# Patient Record
Sex: Female | Born: 1950 | Race: White | Hispanic: No | Marital: Married | Smoking: Never smoker
Health system: Southern US, Community
[De-identification: ages and names within clinical notes are randomized; demographics above are authoritative.]

## PROBLEM LIST (undated history)

## (undated) DIAGNOSIS — G2581 Restless legs syndrome: Secondary | ICD-10-CM

## (undated) DIAGNOSIS — M1990 Unspecified osteoarthritis, unspecified site: Secondary | ICD-10-CM

## (undated) DIAGNOSIS — G4733 Obstructive sleep apnea (adult) (pediatric): Secondary | ICD-10-CM

## (undated) DIAGNOSIS — R251 Tremor, unspecified: Secondary | ICD-10-CM

## (undated) DIAGNOSIS — F329 Major depressive disorder, single episode, unspecified: Secondary | ICD-10-CM

## (undated) DIAGNOSIS — E039 Hypothyroidism, unspecified: Secondary | ICD-10-CM

## (undated) DIAGNOSIS — I10 Essential (primary) hypertension: Secondary | ICD-10-CM

## (undated) DIAGNOSIS — K219 Gastro-esophageal reflux disease without esophagitis: Secondary | ICD-10-CM

## (undated) DIAGNOSIS — M791 Myalgia, unspecified site: Secondary | ICD-10-CM

## (undated) DIAGNOSIS — D696 Thrombocytopenia, unspecified: Secondary | ICD-10-CM

## (undated) DIAGNOSIS — F32A Depression, unspecified: Secondary | ICD-10-CM

## (undated) DIAGNOSIS — F419 Anxiety disorder, unspecified: Secondary | ICD-10-CM

## (undated) DIAGNOSIS — R7303 Prediabetes: Secondary | ICD-10-CM

## (undated) DIAGNOSIS — N8110 Cystocele, unspecified: Secondary | ICD-10-CM

## (undated) HISTORY — PX: TONSILLECTOMY: SUR1361

## (undated) HISTORY — DX: Anxiety disorder, unspecified: F41.9

## (undated) HISTORY — DX: Major depressive disorder, single episode, unspecified: F32.9

## (undated) HISTORY — PX: ANKLE FRACTURE SURGERY: SHX122

## (undated) HISTORY — DX: Depression, unspecified: F32.A

## (undated) HISTORY — PX: KNEE CARTILAGE SURGERY: SHX688

## (undated) HISTORY — DX: Essential (primary) hypertension: I10

## (undated) HISTORY — PX: FRACTURE SURGERY: SHX138

## (undated) HISTORY — DX: Unspecified osteoarthritis, unspecified site: M19.90

---

## 1998-09-30 ENCOUNTER — Other Ambulatory Visit: Admission: RE | Admit: 1998-09-30 | Discharge: 1998-09-30 | Payer: Self-pay | Admitting: Obstetrics and Gynecology

## 1998-11-22 ENCOUNTER — Ambulatory Visit (HOSPITAL_COMMUNITY): Admission: RE | Admit: 1998-11-22 | Discharge: 1998-11-22 | Payer: Self-pay | Admitting: Gastroenterology

## 1999-10-22 ENCOUNTER — Other Ambulatory Visit: Admission: RE | Admit: 1999-10-22 | Discharge: 1999-10-22 | Payer: Self-pay | Admitting: Obstetrics and Gynecology

## 1999-12-19 ENCOUNTER — Other Ambulatory Visit: Admission: RE | Admit: 1999-12-19 | Discharge: 1999-12-19 | Payer: Self-pay | Admitting: Obstetrics and Gynecology

## 1999-12-19 ENCOUNTER — Encounter (INDEPENDENT_AMBULATORY_CARE_PROVIDER_SITE_OTHER): Payer: Self-pay

## 2001-08-05 ENCOUNTER — Other Ambulatory Visit: Admission: RE | Admit: 2001-08-05 | Discharge: 2001-08-05 | Payer: Self-pay | Admitting: Obstetrics and Gynecology

## 2001-09-22 ENCOUNTER — Ambulatory Visit (HOSPITAL_COMMUNITY): Admission: RE | Admit: 2001-09-22 | Discharge: 2001-09-22 | Payer: Self-pay | Admitting: Obstetrics and Gynecology

## 2001-09-22 ENCOUNTER — Encounter (INDEPENDENT_AMBULATORY_CARE_PROVIDER_SITE_OTHER): Payer: Self-pay

## 2002-08-18 ENCOUNTER — Ambulatory Visit (HOSPITAL_COMMUNITY): Admission: RE | Admit: 2002-08-18 | Discharge: 2002-08-18 | Payer: Self-pay | Admitting: Obstetrics and Gynecology

## 2002-08-18 ENCOUNTER — Encounter (INDEPENDENT_AMBULATORY_CARE_PROVIDER_SITE_OTHER): Payer: Self-pay

## 2003-08-14 ENCOUNTER — Other Ambulatory Visit: Admission: RE | Admit: 2003-08-14 | Discharge: 2003-08-14 | Payer: Self-pay | Admitting: Obstetrics and Gynecology

## 2004-09-29 ENCOUNTER — Ambulatory Visit (HOSPITAL_COMMUNITY): Admission: RE | Admit: 2004-09-29 | Discharge: 2004-09-29 | Payer: Self-pay | Admitting: Chiropractic Medicine

## 2004-10-07 ENCOUNTER — Other Ambulatory Visit: Admission: RE | Admit: 2004-10-07 | Discharge: 2004-10-07 | Payer: Self-pay | Admitting: Obstetrics and Gynecology

## 2005-05-18 ENCOUNTER — Ambulatory Visit (HOSPITAL_COMMUNITY): Admission: RE | Admit: 2005-05-18 | Discharge: 2005-05-18 | Payer: Self-pay | Admitting: Gastroenterology

## 2006-03-31 ENCOUNTER — Other Ambulatory Visit: Admission: RE | Admit: 2006-03-31 | Discharge: 2006-03-31 | Payer: Self-pay | Admitting: Obstetrics and Gynecology

## 2008-12-19 ENCOUNTER — Ambulatory Visit (HOSPITAL_COMMUNITY): Admission: RE | Admit: 2008-12-19 | Discharge: 2008-12-19 | Payer: Self-pay | Admitting: Family Medicine

## 2009-09-19 ENCOUNTER — Encounter: Admission: RE | Admit: 2009-09-19 | Discharge: 2009-09-19 | Payer: Self-pay | Admitting: Emergency Medicine

## 2009-12-14 HISTORY — PX: FRACTURE SURGERY: SHX138

## 2011-01-19 ENCOUNTER — Ambulatory Visit (HOSPITAL_COMMUNITY): Payer: BC Managed Care – PPO

## 2011-01-19 ENCOUNTER — Ambulatory Visit (HOSPITAL_COMMUNITY)
Admission: RE | Admit: 2011-01-19 | Discharge: 2011-01-19 | Disposition: A | Discharge status: Home / Self Care | Payer: BC Managed Care – PPO | Source: Ambulatory Visit | Attending: Orthopedic Surgery | Admitting: Orthopedic Surgery

## 2011-01-19 DIAGNOSIS — Y9355 Activity, bike riding: Secondary | ICD-10-CM | POA: Insufficient documentation

## 2011-01-19 DIAGNOSIS — S82402A Unspecified fracture of shaft of left fibula, initial encounter for closed fracture: Secondary | ICD-10-CM

## 2011-01-19 DIAGNOSIS — S82899A Other fracture of unspecified lower leg, initial encounter for closed fracture: Secondary | ICD-10-CM | POA: Insufficient documentation

## 2011-01-19 LAB — CBC
HCT: 42.6 % (ref 36.0–46.0)
Hemoglobin: 14.5 g/dL (ref 12.0–15.0)
MCH: 30.9 pg (ref 26.0–34.0)
MCHC: 34 g/dL (ref 30.0–36.0)
MCV: 90.8 fL (ref 78.0–100.0)
Platelets: 171 10*3/uL (ref 150–400)
RBC: 4.69 MIL/uL (ref 3.87–5.11)
RDW: 12.8 % (ref 11.5–15.5)
WBC: 7.3 10*3/uL (ref 4.0–10.5)

## 2011-01-19 LAB — COMPREHENSIVE METABOLIC PANEL
ALT: 16 U/L (ref 0–35)
AST: 26 U/L (ref 0–37)
Albumin: 4.1 g/dL (ref 3.5–5.2)
Alkaline Phosphatase: 74 U/L (ref 39–117)
BUN: 16 mg/dL (ref 6–23)
CO2: 26 mEq/L (ref 19–32)
Calcium: 9.6 mg/dL (ref 8.4–10.5)
Chloride: 105 mEq/L (ref 96–112)
Creatinine, Ser: 0.81 mg/dL (ref 0.4–1.2)
GFR calc Af Amer: >60 mL/min (ref >60)
GFR calc non Af Amer: >60 mL/min (ref >60)
Glucose, Bld: 99 mg/dL (ref 70–99)
Potassium: 4 mEq/L (ref 3.5–5.1)
Sodium: 142 mEq/L (ref 135–145)
Total Bilirubin: 1 mg/dL (ref 0.3–1.2)
Total Protein: 6.6 g/dL (ref 6.0–8.3)

## 2011-01-19 LAB — PROTIME-INR
INR: 0.99 (ref 0.00–1.49)
Prothrombin Time: 13.3 seconds (ref 11.6–15.2)

## 2011-01-19 LAB — SURGICAL PCR SCREEN
MRSA, PCR: NEGATIVE
Staphylococcus aureus: POSITIVE — AB

## 2011-01-19 LAB — APTT: aPTT: 28 seconds (ref 24–37)

## 2011-01-19 IMAGING — RF DG ANKLE 2V *L*
1 series · 2 of 2 positions shown · non-contrast
Comparison: None.

CLINICAL DATA: Intraoperative films taken during operative
reduction and fixation of a distal left fibular fracture.

LEFT ANKLE - 2 VIEW

[Series 1: run · 2 of 2 slices shown]
[im 1/2]
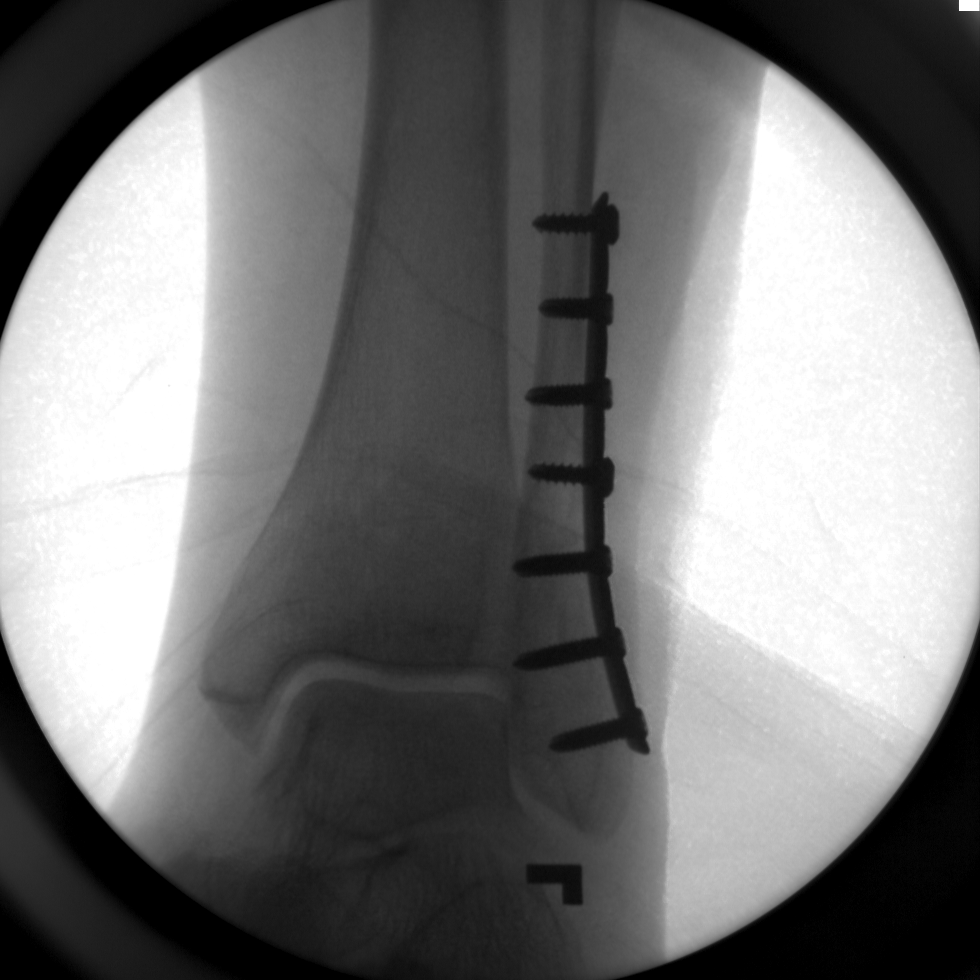
[im 2/2]
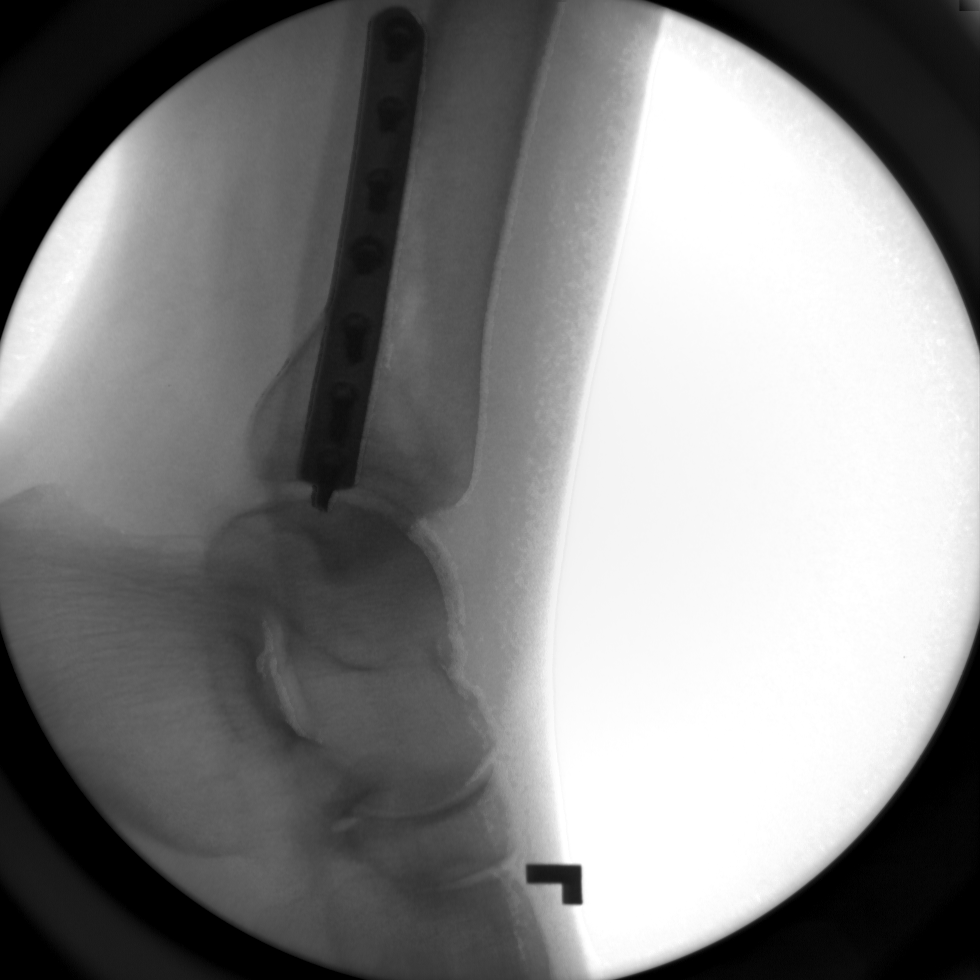

[2 of 2 positions shown; findings below may reference images not displayed]

FINDINGS: Compression plate and screws have been placed reducing
and transfixing a distal left fibular fracture.  The fracture
appears anatomically aligned and positioned.  The plate and screws
appear in satisfactory position.
IMPRESSION: Satisfactory appearance following open reduction and internal
fixation of distal left fibular fracture.
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
## 2011-05-01 NOTE — Op Note (Signed)
Vanessa [name], TOPPINS            ACCOUNT NO.:  [id number]   MEDICAL RECORD NO.:  [id number]          PATIENT TYPE:  AMB   LOCATION:  ENDO                         FACILITY:  [facility]   PHYSICIAN:  Bernette Redbird, M.D.   DATE OF BIRTH:  June 12, 1951   DATE OF PROCEDURE:  05/18/2005  DATE OF DISCHARGE:                                 OPERATIVE REPORT   PROCEDURE:  Colonoscopy.   INDICATION:  A 60 year old female for colon cancer screening in view of a  family history of colon cancer in her mother, who was diagnosed at age 91.  The patient had a negative colonoscopy by me about seven years ago.   FINDINGS:  Normal exam to the terminal ileum.   PROCEDURE:  The nature, purpose, risks of the procedure were familiar to the  patient from prior examinations, and she provided written consent.  Sedation  was fentanyl 75 mcg and Versed 7 mg IV without arrhythmias or desaturation.  The Olympus adjustable-tension pediatric video colonoscope was advanced to  the terminal ileum with just a little bit of external abdominal compression.  The TI had normal appearance, and pullback was then performed.  The quality  of prep was very good, and it is felt that all areas were well-seen.   This was a normal examination.  No polyps, cancer, colitis, vascular  malformations or diverticulosis were noted, and retroflexion the rectum and  reinspection of the rectum were unremarkable.  No biopsies were obtained.  The patient tolerated the procedure well, and there no apparent  complications.   IMPRESSION:  Normal screening colonoscopy in a patient with a family history  of colon cancer.   PLAN:  Follow up colonoscopy in 5 years.      RB/MEDQ  D:  05/18/2005  T:  05/18/2005  Job:  [id number]   cc:   Holley Bouche, M.D.  [address]  [place], [place] [address]  Fax: [phone]   Juluis Mire, M.D.  [address] [place]  [place] [address]  Fax: [phone]

## 2011-05-01 NOTE — H&P (Signed)
NAME:  Vanessa [name], Vanessa [name]                      ACCOUNT NO.:  [id number]   MEDICAL RECORD NO.:  [id number]                   PATIENT TYPE:  AMB   LOCATION:  [facility]                                  FACILITY:  [facility]   PHYSICIAN:  Juluis Mire, M.D.                DATE OF BIRTH:  July 08, 1951   DATE OF ADMISSION:  08/18/2002  DATE OF DISCHARGE:                                HISTORY & PHYSICAL   REASON FOR ADMISSION:  Patient is a G2, P1, AB1.  Patient prevents for  hysteroscopy for removal of endometrial polyp.   In relation to present admission, patient in August 2002 had been on  Activelle for hormone replacement therapy.  She experienced some abnormal  bleeding and subsequently underwent saline-infused ultrasound with finding  of a large endometrial polyp.  She subsequently underwent hysteroscopy in  October which revealed a large endometrial polyp that was resected.  Pathology was benign.  She had been off hormone replacement therapy since  that time but had experienced recurrent vaginal bleeding.  Repeat saline-  infused ultrasound done in June 2003 did reveal recurrent endometrial polyp.  Because of this, she is back for hysteroscopic resection.   ALLERGIES:  No known drug allergies.   MEDICATIONS:  None.   PAST MEDICAL HISTORY:  Usual childhood diseases without any significant  sequelae.   PAST SURGICAL HISTORY:  1. Tonsillectomy.  2. Previous noted hysteroscopy.   OBSTETRICAL HISTORY:  1. Normal spontaneous vaginal delivery.  2. One miscarriage.   FAMILY HISTORY:  Noncontributory.   SOCIAL HISTORY:  Noncontributory.   REVIEW OF SYSTEMS:  Noncontributory.   PHYSICAL EXAMINATION:  GENERAL:  Patient is afebrile.  Normal vital signs.  HEENT:  Normocephalic.  Pupils equal, round and reactive to light and  accomodation.  EOMs intact.  Sclerae and conjunctivae clear.  Oropharynx  clear.  Neck without thyromegaly.  BREAST:  No discrete masses.  LUNGS:  Clear.  CARDIOVASCULAR:  Regular rate and rhythm without murmur or gallops.  ABDOMEN:  Benign.  No masses, organomegaly or tenderness.  PELVIC:  Normal external genitalia.  Vaginal mucosa clear.  Cervix  unremarkable.  Uterus normal in size, shape and contour.  Adnexa without  masses or tenderness.  RECTOVAGINAL:  Clear.  EXTREMITIES:  Trace edema.  NEUROLOGICAL:  Grossly within normal limits.   IMPRESSION:  1. Postmenopausal bleeding with recurrent endometrial polyp.   PLAN:  1. The patient will undergo hysteroscopic resection of the endometrial     polyp.  The risks of surgery have been discussed including the risk of     anesthesia, risk of infection, risk of vascular injury that could lead to     hemorrhage requiring transfusion or possible hysterectomy.  Risks of     uterine perforation that could lead to injury to adjacent organs     recurring laparoscopy or possible exploratory laparotomy, risk of deep     vein thrombosis and  pulmonary embolus.  The patient expressed     understanding of implications and risks.                                               Juluis Mire, M.D.    JSM/MEDQ  D:  08/18/2002  T:  08/18/2002  Job:  [id number]

## 2011-05-01 NOTE — Op Note (Signed)
[facility]  Patient:    Vanessa [name], Vanessa [name] Visit Number: [id number] MRN: [id number]          Service Type: DSU Location: [facility] Attending Physician:  Frederich Balding Dictated by:   Juluis Mire, M.D. Proc. Date: 09/22/01 Admit Date:  09/22/2001                             Operative Report  PREOPERATIVE DIAGNOSIS:       Postmenopausal bleeding.  POSTOPERATIVE DIAGNOSIS:      Postmenopausal bleeding with the finding of a                               fairly large endometrial polyp. OPERATION:                    Cervical dilation, hysteroscopy with removal of                               endometrial polyp, and multiple endometrial                               biopsies, and endometrial curettings.  SURGEON:                      Juluis Mire, M.D.  ANESTHESIA:                   General.  ESTIMATED BLOOD LOSS:         Minimal.  PACKS AND DRAINS:             None.  INTRAOPERATIVE BLOOD REPLACEMENT:                  None.  COMPLICATIONS:                None.  INDICATIONS:                  Are dictated in the History and Physical.  DESCRIPTION OF PROCEDURE:     The patient was taken to the OR and placed in the supine position.  After a satisfactory level of general anesthesia was obtained, the patient was placed in the dorsolithotomy position using the [employer] stirrups.  The perineum and vagina were prepped out with Betadine and draped in the sterile field.  The speculum was placed in the vaginal vault. The cervix was grasped with a single tooth tenaculum.  The uterus sounded to 8 cm.  The cervix was serially dilated to a size 35 Pratt dilator.  The hysteroscope was introduced.  The intrauterine cavity was distended using sorbitol.  A large endometrial polyp was noted and removed using the resectoscope.  It was sent for pathologic review.  Endometrial lining was somewhat hyperemic, but otherwise unremarkable.  These were the  anterior, posterior, and both lateral walls.  No active bleeding or perforation was noted.  Endometrial curettings were also obtained and sent for pathologic review.  At the end of the procedure, there was no evidence of perforation or active bleeding.  The single tooth tenaculum and speculum then removed.  The patient was taken out of the dorsolithotomy position once alert, and transferred to the recovery room in good condition.  Sponge, instrument, and needle count  was reported as correct by the circulating nurse. Dictated by:   Juluis Mire, M.D. Attending Physician:  Frederich Balding DD:  09/22/01 TD:  09/22/01 Job: [id number]

## 2011-05-01 NOTE — Op Note (Signed)
   NAME:  Vanessa [name], Vanessa [name]                      ACCOUNT NO.:  [id number]   MEDICAL RECORD NO.:  [id number]                   PATIENT TYPE:  AMB   LOCATION:  [facility]C                                  FACILITY:  [facility]   PHYSICIAN:  Juluis Mire, M.D.                DATE OF BIRTH:  1951-03-07   DATE OF PROCEDURE:  08/18/2002  DATE OF DISCHARGE:                                 OPERATIVE REPORT   PREOPERATIVE DIAGNOSES:  Postmenopausal bleeding with endometrial polyp.   POSTOPERATIVE DIAGNOSES:  Postmenopausal bleeding with endometrial polyp.   PROCEDURE:  Paracervical block, cervical dilation, hysteroscopy with  multiple endometrial biopsies, endometrial curettings.   SURGEON:  Juluis Mire, M.D.   ANESTHESIA:  Sedation with paracervical block.   ESTIMATED BLOOD LOSS:  Nil.   PACKS AND DRAINS:  None.   INTRAOPERATIVE BLOOD PLACED:  None.   COMPLICATIONS:  None.   INDICATIONS:  Dictated in history and physical.   PROCEDURE AS FOLLOWS:  The patient was taken to the OR, placed in supine  position.  After sedation was placed in the dorsal lithotomy position using  the [employer] stirrups.  The patient was then draped out for hysteroscopy.  Speculum was placed in the vaginal vault.  Cervix, and vagina cleansed with  Betadine.  Paracervical block using 1% Xylocaine was instituted.  Cervix was  secured with a single tooth tenaculum.  Uterus sounded approximately 8 cm.  Cervix serially dilated to a size 35 Pratt dilator.  Operative hysteroscope  was introduced.  Intrauterine cavity was visualized after distention with  sorbitol.  Did not really see a polyp at this point.  It may have been  disrupted by the cervical dilation.  There was some shaggy endometrium.  We  obtained multiple endometrial biopsies in the anterior and posterior lateral  walls.  These were all sent for pathologic review.  No active bleeding was  noted.  Endometrial curettings were obtained, also sent for pathology.  No  active bleeding or perforation was noted.  The hysteroscope and single tooth  tenaculum were then removed.  The patient taken out of the dorsal lithotomy  position.  Once alert transferred to recovery room in good condition.  Sponge, instrument, needle count reported as correct by circulating nurse.                                               Juluis Mire, M.D.    JSM/MEDQ  D:  08/18/2002  T:  08/18/2002  Job:  [id number]

## 2011-05-01 NOTE — H&P (Signed)
[facility]  Patient:    Vanessa [name], Vanessa [name] Visit Number: [id number] MRN: [id number]          Service Type: Attending:  Juluis Mire, M.D. Dictated by:   Juluis Mire, M.D. Adm. Date:  09/22/01                           History and Physical  HISTORY OF PRESENT ILLNESS:   The patient is a 60 year old, gravida 2, para 1, abortus 1, married, white female who presents for hysteroscopy with D&C for evaluation of abnormal uterine bleeding.  In relation to the present admission, the patient has been on Activella for management of menopausal symptomatology.  She has had continued abnormal bleeding in June and July.  She had a previous ultrasound and endometrial sampling done last year with negative findings.  She underwent repeat evaluation, which was basically unremarkable.  In view of continued abnormal bleeding on hormone replacement therapy, the patient presents for hysteroscopic evaluation.  ALLERGIES:                    No known drug allergies.  MEDICATIONS:                  Activella.  PAST MEDICAL HISTORY:         Usual childhood diseases without any significant sequelae.  PAST SURGICAL HISTORY:        Tonsillectomy.  OBSTETRICAL HISTORY:          One spontaneous vaginal delivery.  One miscarriage.  FAMILY HISTORY:               Noncontributory.  SOCIAL HISTORY:               Noncontributory.  REVIEW OF SYSTEMS:            Noncontributory.  PHYSICAL EXAMINATION:         The patient is afebrile with stable vital signs.  HEENT:                        The patient is normocephalic and atraumatic. Pupils equal, round, and reactive to light and accommodation.  Extraocular movements are intact.  The sclerae and conjunctivae are clear.  NECK:                         Without thyromegaly.  BREASTS:                      No discrete masses.  LUNGS:                        Clear.  CARDIOVASCULAR:               Regular rate.  No murmurs or  gallops.  ABDOMEN:                      Exam is benign.  PELVIC:                       Normal external genitalia.  The vaginal mucosa is clear.  Cervix unremarkable.  Uterus normal in size, shape, and contrast. Adnexa free of masses or tenderness.  The rectovaginal exam is clear.  EXTREMITIES:  Trace edema.  NEUROLOGIC:                   Grossly within normal limits.  BASIC IMPRESSION:             Postmenopausal bleeding on continuous hormone replacement therapy.  PLAN:                         The patient will undergo hysteroscopic evaluation with resectoscope to rule out any type of endometrial pathology. The risks of surgery have been discussed, including the risk of hemorrhage which could necessitate transfusion or hysterectomy, the risk of uterine perforation which could lead to injury to adjacent organs requiring laparoscopy and possible exploratory laparotomy, and the risk of deep venous thrombosis and pulmonary embolus.  The patient expressed and understanding of the indications and risks and is accepting of them. Dictated by:   Juluis Mire, M.D. Attending:  Juluis Mire, M.D. DD:  09/22/01 TD:  09/22/01 Job: [id number]

## 2011-06-10 ENCOUNTER — Other Ambulatory Visit: Payer: Self-pay | Admitting: Obstetrics and Gynecology

## 2011-06-10 DIAGNOSIS — R928 Other abnormal and inconclusive findings on diagnostic imaging of breast: Secondary | ICD-10-CM

## 2011-06-18 ENCOUNTER — Ambulatory Visit
Admission: RE | Admit: 2011-06-18 | Discharge: 2011-06-18 | Disposition: A | Discharge status: Home / Self Care | Payer: BC Managed Care – PPO | Source: Ambulatory Visit | Attending: Obstetrics and Gynecology | Admitting: Obstetrics and Gynecology

## 2011-06-18 DIAGNOSIS — R928 Other abnormal and inconclusive findings on diagnostic imaging of breast: Secondary | ICD-10-CM

## 2011-12-25 ENCOUNTER — Ambulatory Visit (INDEPENDENT_AMBULATORY_CARE_PROVIDER_SITE_OTHER): Payer: BC Managed Care – PPO

## 2011-12-25 DIAGNOSIS — F4321 Adjustment disorder with depressed mood: Secondary | ICD-10-CM

## 2011-12-25 DIAGNOSIS — J069 Acute upper respiratory infection, unspecified: Secondary | ICD-10-CM

## 2011-12-25 DIAGNOSIS — J111 Influenza due to unidentified influenza virus with other respiratory manifestations: Secondary | ICD-10-CM

## 2012-02-14 ENCOUNTER — Other Ambulatory Visit: Payer: Self-pay | Admitting: Emergency Medicine

## 2012-03-09 ENCOUNTER — Ambulatory Visit (INDEPENDENT_AMBULATORY_CARE_PROVIDER_SITE_OTHER): Payer: BC Managed Care – PPO | Admitting: Emergency Medicine

## 2012-03-09 VITALS — BP 84/55 | HR 85 | Temp 98.0°F | Resp 20 | Ht 67.0 in | Wt 161.4 lb

## 2012-03-09 DIAGNOSIS — E789 Disorder of lipoprotein metabolism, unspecified: Secondary | ICD-10-CM

## 2012-03-09 DIAGNOSIS — F339 Major depressive disorder, recurrent, unspecified: Secondary | ICD-10-CM

## 2012-03-09 DIAGNOSIS — F329 Major depressive disorder, single episode, unspecified: Secondary | ICD-10-CM

## 2012-03-09 DIAGNOSIS — I10 Essential (primary) hypertension: Secondary | ICD-10-CM

## 2012-03-09 DIAGNOSIS — E785 Hyperlipidemia, unspecified: Secondary | ICD-10-CM

## 2012-03-09 DIAGNOSIS — F32A Depression, unspecified: Secondary | ICD-10-CM | POA: Insufficient documentation

## 2012-03-09 LAB — LIPID PANEL
Cholesterol: 174 mg/dL (ref 0–200)
HDL: 70 mg/dL (ref >39)
LDL Cholesterol: 88 mg/dL (ref 0–99)
Total CHOL/HDL Ratio: 2.5 Ratio
Triglycerides: 79 mg/dL (ref <150)
VLDL: 16 mg/dL (ref 0–40)

## 2012-03-09 LAB — POCT CBC
Granulocyte percent: 51.9 %G (ref 37–80)
HCT, POC: 42.8 % (ref 37.7–47.9)
Hemoglobin: 14 g/dL (ref 12.2–16.2)
Lymph, poc: 1.7 (ref 0.6–3.4)
MCH, POC: 30.2 pg (ref 27–31.2)
MCHC: 32.7 g/dL (ref 31.8–35.4)
MCV: 92.4 fL (ref 80–97)
MID (cbc): 0.4 (ref 0–0.9)
MPV: 11.1 fL (ref 0–99.8)
POC Granulocyte: 2.3 (ref 2–6.9)
POC LYMPH PERCENT: 38.9 %L (ref 10–50)
POC MID %: 9.2 %M (ref 0–12)
Platelet Count, POC: 144 10*3/uL (ref 142–424)
RBC: 4.63 M/uL (ref 4.04–5.48)
RDW, POC: 13 %
WBC: 4.4 10*3/uL — AB (ref 4.6–10.2)

## 2012-03-09 LAB — COMPREHENSIVE METABOLIC PANEL
ALT: 14 U/L (ref 0–35)
AST: 22 U/L (ref 0–37)
Albumin: 4.3 g/dL (ref 3.5–5.2)
Alkaline Phosphatase: 62 U/L (ref 39–117)
BUN: 18 mg/dL (ref 6–23)
CO2: 28 mEq/L (ref 19–32)
Calcium: 9.7 mg/dL (ref 8.4–10.5)
Chloride: 107 mEq/L (ref 96–112)
Creat: 0.77 mg/dL (ref 0.50–1.10)
Glucose, Bld: 87 mg/dL (ref 70–99)
Potassium: 4.3 mEq/L (ref 3.5–5.3)
Sodium: 143 mEq/L (ref 135–145)
Total Bilirubin: 0.4 mg/dL (ref 0.3–1.2)
Total Protein: 6.6 g/dL (ref 6.0–8.3)

## 2012-03-09 LAB — TSH: TSH: 2.913 u[IU]/mL (ref 0.350–4.500)

## 2012-03-09 MED ORDER — ESCITALOPRAM OXALATE 10 MG PO TABS: ORAL_TABLET | ORAL | Status: DC

## 2012-03-09 MED ORDER — ALPRAZOLAM 1 MG PO TABS: 1.0000 mg | ORAL_TABLET | Freq: Every evening | ORAL | Status: DC | PRN

## 2012-03-09 MED ORDER — PRAVASTATIN SODIUM 40 MG PO TABS: 40.0000 mg | ORAL_TABLET | ORAL | Status: DC

## 2012-03-09 MED ORDER — LISINOPRIL 20 MG PO TABS: ORAL_TABLET | ORAL | Status: DC

## 2012-03-09 NOTE — Progress Notes (Signed)
  Subjective:    Patient ID: Vanessa [name], female    DOB: 02/27/1951, 60 y.o.   MRN: [id number]  HPI patient enters for followup of her high blood pressure high cholesterol and depression. She resigned from her position in December. Since that time she is done incredibly better. Her anxiety is much improved. Her depression is much improved. She has felt remarkably better without the stress of her previous employer.    Review of Systems  Constitutional: Negative.   HENT: Negative.   Eyes: Negative.   Respiratory: Negative.   Cardiovascular: Negative.   Gastrointestinal: Negative.   Genitourinary: Negative.   Neurological: Negative.        Objective:   Physical Exam  Constitutional: She appears well-developed.  HENT:  Head: Normocephalic.  Eyes: Pupils are equal, round, and reactive to light.  Neck: No JVD present. No tracheal deviation present. No thyromegaly present.  Cardiovascular: Normal rate, regular rhythm and normal heart sounds.   Pulmonary/Chest: Breath sounds normal. No respiratory distress. She has no wheezes. She has no rales. She exhibits no tenderness.  Abdominal: She exhibits no distension and no mass. There is no tenderness. There is no rebound and no guarding.  Musculoskeletal: She exhibits no edema.  Lymphadenopathy:    She has no cervical adenopathy.  Neurological: No cranial nerve deficit. Coordination normal.          Assessment & Plan:   Patient doing much better since she quit her job. She feels much better she feels less depressed. She feels the Wellbutrin does not help at all. She would like to switch back to an SSRI which have plan to do. Also cut her blood pressure medication in that initial blood pressure was extremely low repeat done was 110/70.

## 2012-04-23 ENCOUNTER — Telehealth: Payer: Self-pay

## 2012-04-23 NOTE — Telephone Encounter (Signed)
Was given a rx for lexapro, but can not afford. Wants to get a rx for generic celexa   [facility] [address]

## 2012-04-24 MED ORDER — CITALOPRAM HYDROBROMIDE 20 MG PO TABS: 20.0000 mg | ORAL_TABLET | Freq: Every day | ORAL | Status: DC

## 2012-04-24 NOTE — Telephone Encounter (Signed)
I have switched rx to celexa 20 mg. Please notify pt.

## 2012-04-25 NOTE — Telephone Encounter (Signed)
patient notified and voiced understanding. 

## 2012-09-19 ENCOUNTER — Telehealth: Payer: Self-pay

## 2012-09-19 NOTE — Telephone Encounter (Signed)
The patient called to request refill of generic xanax.  The patient states that she does not have employment at this time and it would be a financial hardship to come in to be seen at this time.  The patient uses the [facility] on [address].  Please call the patient at [phone].

## 2012-09-20 MED ORDER — ALPRAZOLAM 1 MG PO TABS: 1.0000 mg | ORAL_TABLET | Freq: Every evening | ORAL | Status: DC | PRN

## 2012-09-20 NOTE — Telephone Encounter (Signed)
At TL desk 

## 2012-09-20 NOTE — Telephone Encounter (Signed)
Rx called in and pt notified.

## 2012-10-24 ENCOUNTER — Other Ambulatory Visit: Payer: Self-pay | Admitting: Physician Assistant

## 2012-11-28 ENCOUNTER — Other Ambulatory Visit: Payer: Self-pay | Admitting: Radiology

## 2012-11-28 NOTE — Telephone Encounter (Signed)
Please advise on renewal of Alprazolam 1mg  fax rc'd from [facility], pended Rx

## 2012-11-28 NOTE — Telephone Encounter (Signed)
Have also gotten request for Citalopram pended this as well

## 2012-11-29 ENCOUNTER — Other Ambulatory Visit: Payer: Self-pay | Admitting: Radiology

## 2012-11-29 MED ORDER — ALPRAZOLAM 1 MG PO TABS: 1.0000 mg | ORAL_TABLET | Freq: Every evening | ORAL | Status: DC | PRN

## 2012-11-29 MED ORDER — CITALOPRAM HYDROBROMIDE 20 MG PO TABS: 20.0000 mg | ORAL_TABLET | Freq: Every day | ORAL | Status: DC

## 2012-12-31 ENCOUNTER — Other Ambulatory Visit: Payer: Self-pay | Admitting: Emergency Medicine

## 2012-12-31 NOTE — Telephone Encounter (Signed)
Please call and see what is going on with patient. She has not been on Xanax regular.

## 2013-01-01 NOTE — Telephone Encounter (Signed)
Please call patient and give more information about why she needs the Xanax . She's had difficulty at times with her family and stress she rarely takes the medication. If she just needs this for short term it would be okay to refill medication x1 .

## 2013-01-03 NOTE — Telephone Encounter (Signed)
Called patient and she takes at bedtime, helps her "shut off" She takes it every night, she sometimes can cut this in half. Please advise. She states she can not come in now because she is without insurance, I advised her I will call back and let her know

## 2013-01-03 NOTE — Telephone Encounter (Signed)
She can have one refill on her medication but for further refills she will need to come in and talk with me

## 2013-01-09 NOTE — Telephone Encounter (Signed)
Patient was advised of this 

## 2013-03-08 ENCOUNTER — Other Ambulatory Visit: Payer: Self-pay | Admitting: Emergency Medicine

## 2013-03-08 NOTE — Telephone Encounter (Signed)
Called her. She is doing great, she does not have any health insurance currently, having a hard time finding a job. She is advised she is in need of office visit.

## 2013-03-08 NOTE — Telephone Encounter (Signed)
Please call and see what is going on with Vanessa [name]. I have not seen her for a year and at the last visit she was doing well but that was one year ago. It is certainly okay to refill her Xanax but I would like more information about what is going on in her life and see whether she needs to come in and talk to me.

## 2013-04-05 ENCOUNTER — Ambulatory Visit: Payer: Self-pay | Admitting: Emergency Medicine

## 2013-04-05 VITALS — BP 124/90 | HR 89 | Temp 98.0°F | Resp 16 | Ht 66.0 in | Wt 205.0 lb

## 2013-04-05 DIAGNOSIS — R059 Cough, unspecified: Secondary | ICD-10-CM

## 2013-04-05 DIAGNOSIS — J209 Acute bronchitis, unspecified: Secondary | ICD-10-CM

## 2013-04-05 DIAGNOSIS — R05 Cough: Secondary | ICD-10-CM

## 2013-04-05 DIAGNOSIS — R062 Wheezing: Secondary | ICD-10-CM

## 2013-04-05 DIAGNOSIS — G4700 Insomnia, unspecified: Secondary | ICD-10-CM

## 2013-04-05 MED ORDER — IPRATROPIUM BROMIDE 0.02 % IN SOLN
0.5000 mg | Freq: Once | RESPIRATORY_TRACT | Status: AC
  Administered 2013-04-05: 0.5 mg via RESPIRATORY_TRACT

## 2013-04-05 MED ORDER — ALBUTEROL SULFATE HFA 108 (90 BASE) MCG/ACT IN AERS: 2.0000 | INHALATION_SPRAY | RESPIRATORY_TRACT | Status: DC | PRN

## 2013-04-05 MED ORDER — ALBUTEROL SULFATE (2.5 MG/3ML) 0.083% IN NEBU
2.5000 mg | INHALATION_SOLUTION | Freq: Once | RESPIRATORY_TRACT | Status: AC
  Administered 2013-04-05: 2.5 mg via RESPIRATORY_TRACT

## 2013-04-05 MED ORDER — ALPRAZOLAM 1 MG PO TABS: ORAL_TABLET | ORAL | Status: DC

## 2013-04-05 MED ORDER — AMOXICILLIN 500 MG PO CAPS: ORAL_CAPSULE | ORAL | Status: DC

## 2013-04-05 MED ORDER — BENZONATATE 100 MG PO CAPS: 100.0000 mg | ORAL_CAPSULE | Freq: Three times a day (TID) | ORAL | Status: DC | PRN

## 2013-04-05 NOTE — Patient Instructions (Addendum)

## 2013-04-05 NOTE — Progress Notes (Signed)
  Subjective:    Patient ID: Vanessa [name], female    DOB: January 04, 1951, 62 y.o.   MRN: [id number]  HPI patient states she caught a cold a few weeks ago. 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 She has no history of allergies this time here. She's had significant nasal drainage and recently has developed a cough which has been productive of greenish type phlegm. She has a significant cough. She is a nonsmoker. She is currently not working she has gotten insurance which starts next month. She needs a refill on her Xanax she takes at night.    Review of Systems     Objective:   Physical Exam HEENT exam eyes are normal TMs are clear nose is significantly congested the posterior pharynx is clear. The neck is supple. There is no adenopathy. Chest exam reveals poor air exchange with decreased breath sounds in the bases peak flow was attempted but she had difficulty with this but the register was 210. After breathing treatment with Albuterol, peak flow increased to 300.        Assessment & Plan:  It sounds as though patient developed upper as to infection now followed by sinusitis with some evidence of bronchospasm. Will given nebulizer treatment with albuterol and then reevaluate to see if albuterol inhaler is indicated along with her antibiotics she will need for her discolored phlegm.

## 2013-05-01 ENCOUNTER — Other Ambulatory Visit: Payer: Self-pay | Admitting: Obstetrics and Gynecology

## 2013-05-01 DIAGNOSIS — N63 Unspecified lump in unspecified breast: Secondary | ICD-10-CM

## 2013-05-26 ENCOUNTER — Other Ambulatory Visit: Payer: Self-pay | Admitting: Gastroenterology

## 2013-08-29 ENCOUNTER — Other Ambulatory Visit: Payer: Self-pay | Admitting: Emergency Medicine

## 2013-10-18 ENCOUNTER — Other Ambulatory Visit: Payer: Self-pay | Admitting: Emergency Medicine

## 2013-10-18 NOTE — Telephone Encounter (Signed)
Needs OV, 2nd notice 

## 2013-12-13 ENCOUNTER — Other Ambulatory Visit: Payer: Self-pay | Admitting: Emergency Medicine

## 2013-12-16 ENCOUNTER — Ambulatory Visit (INDEPENDENT_AMBULATORY_CARE_PROVIDER_SITE_OTHER): Payer: BC Managed Care – PPO | Admitting: Emergency Medicine

## 2013-12-16 VITALS — BP 118/86 | HR 88 | Temp 98.2°F | Resp 16 | Ht 66.75 in | Wt 209.0 lb

## 2013-12-16 DIAGNOSIS — F3289 Other specified depressive episodes: Secondary | ICD-10-CM

## 2013-12-16 DIAGNOSIS — J209 Acute bronchitis, unspecified: Secondary | ICD-10-CM

## 2013-12-16 DIAGNOSIS — F32A Depression, unspecified: Secondary | ICD-10-CM

## 2013-12-16 DIAGNOSIS — R062 Wheezing: Secondary | ICD-10-CM

## 2013-12-16 DIAGNOSIS — G4700 Insomnia, unspecified: Secondary | ICD-10-CM

## 2013-12-16 DIAGNOSIS — I10 Essential (primary) hypertension: Secondary | ICD-10-CM

## 2013-12-16 DIAGNOSIS — E785 Hyperlipidemia, unspecified: Secondary | ICD-10-CM

## 2013-12-16 DIAGNOSIS — F329 Major depressive disorder, single episode, unspecified: Secondary | ICD-10-CM

## 2013-12-16 LAB — COMPREHENSIVE METABOLIC PANEL
ALT: 18 U/L (ref 0–35)
AST: 24 U/L (ref 0–37)
Albumin: 4.2 g/dL (ref 3.5–5.2)
Alkaline Phosphatase: 77 U/L (ref 39–117)
BILIRUBIN TOTAL: 0.7 mg/dL (ref 0.3–1.2)
BUN: 12 mg/dL (ref 6–23)
CO2: 27 meq/L (ref 19–32)
CREATININE: 0.73 mg/dL (ref 0.50–1.10)
Calcium: 9.2 mg/dL (ref 8.4–10.5)
Chloride: 106 mEq/L (ref 96–112)
Glucose, Bld: 91 mg/dL (ref 70–99)
Potassium: 3.9 mEq/L (ref 3.5–5.3)
Sodium: 140 mEq/L (ref 135–145)
Total Protein: 7 g/dL (ref 6.0–8.3)

## 2013-12-16 LAB — POCT CBC
GRANULOCYTE PERCENT: 61.5 % (ref 37–80)
HEMATOCRIT: 45.8 % (ref 37.7–47.9)
HEMOGLOBIN: 14.4 g/dL (ref 12.2–16.2)
Lymph, poc: 1.8 (ref 0.6–3.4)
MCH: 30.2 pg (ref 27–31.2)
MCHC: 31.4 g/dL — AB (ref 31.8–35.4)
MCV: 96 fL (ref 80–97)
MID (CBC): 0.4 (ref 0–0.9)
MPV: 9.4 fL (ref 0–99.8)
POC Granulocyte: 3.5 (ref 2–6.9)
POC LYMPH PERCENT: 31.7 %L (ref 10–50)
POC MID %: 6.8 %M (ref 0–12)
Platelet Count, POC: 152 10*3/uL (ref 142–424)
RBC: 4.77 M/uL (ref 4.04–5.48)
RDW, POC: 13.4 %
WBC: 5.7 10*3/uL (ref 4.6–10.2)

## 2013-12-16 LAB — LIPID PANEL
Cholesterol: 199 mg/dL (ref 0–200)
HDL: 55 mg/dL (ref >39)
LDL Cholesterol: 117 mg/dL — ABNORMAL HIGH (ref 0–99)
TRIGLYCERIDES: 136 mg/dL (ref <150)
Total CHOL/HDL Ratio: 3.6 Ratio
VLDL: 27 mg/dL (ref 0–40)

## 2013-12-16 LAB — TSH: TSH: 2.964 u[IU]/mL (ref 0.350–4.500)

## 2013-12-16 MED ORDER — ESCITALOPRAM OXALATE 10 MG PO TABS: ORAL_TABLET | ORAL | Status: DC

## 2013-12-16 MED ORDER — ALPRAZOLAM 1 MG PO TABS: ORAL_TABLET | ORAL | Status: DC

## 2013-12-16 MED ORDER — ALBUTEROL SULFATE (2.5 MG/3ML) 0.083% IN NEBU
2.5000 mg | INHALATION_SOLUTION | Freq: Once | RESPIRATORY_TRACT | Status: AC
  Administered 2013-12-16: 2.5 mg via RESPIRATORY_TRACT

## 2013-12-16 MED ORDER — CITALOPRAM HYDROBROMIDE 20 MG PO TABS: ORAL_TABLET | ORAL | Status: DC

## 2013-12-16 MED ORDER — PRAVASTATIN SODIUM 40 MG PO TABS: 40.0000 mg | ORAL_TABLET | ORAL | Status: DC

## 2013-12-16 MED ORDER — LISINOPRIL 20 MG PO TABS: ORAL_TABLET | ORAL | Status: DC

## 2013-12-16 MED ORDER — ALBUTEROL SULFATE HFA 108 (90 BASE) MCG/ACT IN AERS: 2.0000 | INHALATION_SPRAY | RESPIRATORY_TRACT | Status: DC | PRN

## 2013-12-16 NOTE — Progress Notes (Addendum)
   Subjective:    Patient ID: Vanessa [name], female    DOB: 03/16/1951, 63 y.o.   MRN: [id number] This chart was scribed for Lesle ChrisSteven Zoey Gilkeson, MD by Nicholos Johnsenise Iheanachor, Medical Scribe. This patient's care was started at 9:05 AM.  HPI HPI Comments: Vanessa [name] is a 63 y.o. female who presents to the [facility] complaining of sinus pressure, HA, clear productive cough, and rhinorrhea. Pt reports a low grade fever earlier this week that was resolved with ibuprofen. Been taking albuterol with some relief. Pt has potential sick contacts. Pt states she did not use her inhaler this morning. Pt did not have a flu shot in 2014.  Pt is also here for Lisinopril, Xanex, and Celexa refill. Reports she has been off her cholesterol medication for 6-7 months  Review of Systems  HENT: Positive for sinus pressure.   Respiratory: Positive for cough.   Neurological: Positive for headaches.      Objective:   Physical Exam  Vitals reviewed.  CONSTITUTIONAL: Well developed/well nourished HEAD: Normocephalic/atraumatic EYES: EOMI/PERRL ENMT: Mucous membranes moist NECK: supple no meningeal signs SPINE:entire spine nontender CV: S1/S2 noted, no murmurs/rubs/gallops noted LUNGS: Lungs are clear to auscultation bilaterally, no apparent distress there are occasional wheezes present but good air exchange ABDOMEN: soft, nontender, no rebound or guarding GU:no cva tenderness NEURO: Pt is awake/alert, moves all extremitiesx4 EXTREMITIES: pulses normal, full ROM SKIN: warm, color normal PSYCH: no abnormalities of mood noted Results for orders placed in visit on 12/16/13  POCT CBC      Result Value Range   WBC 5.7  4.6 - 10.2 K/uL   Lymph, poc 1.8  0.6 - 3.4   POC LYMPH PERCENT 31.7  10 - 50 %L   MID (cbc) 0.4  0 - 0.9   POC MID % 6.8  0 - 12 %M   POC Granulocyte 3.5  2 - 6.9   Granulocyte percent 61.5  37 - 80 %G   RBC 4.77  4.04 - 5.48 M/uL   Hemoglobin 14.4  12.2 - 16.2 g/dL   HCT, POC 78.245.8  95.637.7 - 47.9 %   MCV 96.0  80 - 97 fL   MCH, POC 30.2  27 - 31.2 pg   MCHC 31.4 (*) 31.8 - 35.4 g/dL   RDW, POC 21.313.4     Platelet Count, POC 152  142 - 424 K/uL   MPV 9.4  0 - 99.8 fL      Assessment & Plan:   Meds were refilled. She will use her albuterol inhaler.

## 2013-12-16 NOTE — Progress Notes (Signed)
   Subjective:    Patient ID: Vanessa [name], female    DOB: 12/28/1950, 63 y.o.   MRN: [id number]  HPI    Review of Systems     Objective:   Physical Exam        Assessment & Plan:

## 2014-02-14 ENCOUNTER — Telehealth: Payer: Self-pay

## 2014-02-14 NOTE — Telephone Encounter (Signed)
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

## 2014-02-15 NOTE — Telephone Encounter (Signed)
Spoke to patient, she is aware she will need an ov for this. She is aware.

## 2014-05-10 ENCOUNTER — Other Ambulatory Visit: Payer: Self-pay | Admitting: Emergency Medicine

## 2014-05-11 NOTE — Telephone Encounter (Signed)
Faxed

## 2014-06-26 ENCOUNTER — Other Ambulatory Visit: Payer: Self-pay | Admitting: Emergency Medicine

## 2014-06-27 NOTE — Telephone Encounter (Signed)
Phoned in.

## 2014-08-05 ENCOUNTER — Other Ambulatory Visit: Payer: Self-pay | Admitting: Emergency Medicine

## 2014-08-07 ENCOUNTER — Other Ambulatory Visit: Payer: Self-pay | Admitting: Radiology

## 2014-08-07 NOTE — Telephone Encounter (Signed)
I gave her one refill but I would really like her to come in and talk to me and give me an update about how she is feeling.

## 2014-08-07 NOTE — Telephone Encounter (Signed)
Rx is being faxed from [facility]. LMOM for pt that f/up is needed for more.

## 2014-08-11 ENCOUNTER — Other Ambulatory Visit: Payer: Self-pay | Admitting: Emergency Medicine

## 2014-08-15 ENCOUNTER — Telehealth: Payer: Self-pay

## 2014-08-15 NOTE — Telephone Encounter (Signed)
Pt is wanting to talk with someone about refill on her alprazalem and that the rx has not been sent to pharmacist yet

## 2014-08-15 NOTE — Telephone Encounter (Signed)
Spoke with pt. She says her pharm never got the rx. Looks like [facility] was supposed to fax it not sure if they did. Called it into her pharm for her.

## 2014-10-14 ENCOUNTER — Ambulatory Visit (INDEPENDENT_AMBULATORY_CARE_PROVIDER_SITE_OTHER): Payer: BC Managed Care – PPO | Admitting: Emergency Medicine

## 2014-10-14 VITALS — BP 118/80 | HR 75 | Temp 97.6°F | Resp 18 | Ht 66.5 in | Wt 190.0 lb

## 2014-10-14 DIAGNOSIS — M25562 Pain in left knee: Secondary | ICD-10-CM

## 2014-10-14 DIAGNOSIS — E785 Hyperlipidemia, unspecified: Secondary | ICD-10-CM

## 2014-10-14 DIAGNOSIS — R0683 Snoring: Secondary | ICD-10-CM

## 2014-10-14 DIAGNOSIS — G4700 Insomnia, unspecified: Secondary | ICD-10-CM

## 2014-10-14 DIAGNOSIS — M25561 Pain in right knee: Secondary | ICD-10-CM

## 2014-10-14 DIAGNOSIS — I10 Essential (primary) hypertension: Secondary | ICD-10-CM

## 2014-10-14 LAB — COMPLETE METABOLIC PANEL WITH GFR
ALBUMIN: 4.2 g/dL (ref 3.5–5.2)
ALT: 12 U/L (ref 0–35)
AST: 17 U/L (ref 0–37)
Alkaline Phosphatase: 80 U/L (ref 39–117)
BILIRUBIN TOTAL: 0.5 mg/dL (ref 0.2–1.2)
BUN: 16 mg/dL (ref 6–23)
CO2: 26 mEq/L (ref 19–32)
Calcium: 9.4 mg/dL (ref 8.4–10.5)
Chloride: 106 mEq/L (ref 96–112)
Creat: 0.71 mg/dL (ref 0.50–1.10)
GFR, Est African American: >89 mL/min
GLUCOSE: 94 mg/dL (ref 70–99)
POTASSIUM: 4.1 meq/L (ref 3.5–5.3)
SODIUM: 139 meq/L (ref 135–145)
TOTAL PROTEIN: 6.9 g/dL (ref 6.0–8.3)

## 2014-10-14 LAB — LIPID PANEL
Cholesterol: 222 mg/dL — ABNORMAL HIGH (ref 0–200)
HDL: 55 mg/dL (ref >39)
LDL CALC: 147 mg/dL — AB (ref 0–99)
Total CHOL/HDL Ratio: 4 Ratio
Triglycerides: 101 mg/dL (ref <150)
VLDL: 20 mg/dL (ref 0–40)

## 2014-10-14 LAB — TSH: TSH: 2 u[IU]/mL (ref 0.350–4.500)

## 2014-10-14 LAB — POCT CBC
GRANULOCYTE PERCENT: 55.9 % (ref 37–80)
HEMATOCRIT: 45.7 % (ref 37.7–47.9)
Hemoglobin: 14.7 g/dL (ref 12.2–16.2)
Lymph, poc: 1.7 (ref 0.6–3.4)
MCH, POC: 30.3 pg (ref 27–31.2)
MCHC: 32.2 g/dL (ref 31.8–35.4)
MCV: 94.2 fL (ref 80–97)
MID (cbc): 0.5 (ref 0–0.9)
MPV: 8.8 fL (ref 0–99.8)
POC Granulocyte: 2.7 (ref 2–6.9)
POC LYMPH PERCENT: 34.9 %L (ref 10–50)
POC MID %: 9.2 % (ref 0–12)
Platelet Count, POC: 152 10*3/uL (ref 142–424)
RBC: 4.86 M/uL (ref 4.04–5.48)
RDW, POC: 14.4 %
WBC: 4.9 10*3/uL (ref 4.6–10.2)

## 2014-10-14 MED ORDER — TRAMADOL HCL 50 MG PO TABS: 50.0000 mg | ORAL_TABLET | Freq: Four times a day (QID) | ORAL | Status: DC | PRN

## 2014-10-14 MED ORDER — CITALOPRAM HYDROBROMIDE 20 MG PO TABS: ORAL_TABLET | ORAL | Status: DC

## 2014-10-14 MED ORDER — ALPRAZOLAM 1 MG PO TABS: ORAL_TABLET | ORAL | Status: DC

## 2014-10-14 MED ORDER — LISINOPRIL 20 MG PO TABS: ORAL_TABLET | ORAL | Status: DC

## 2014-10-14 MED ORDER — PRAVASTATIN SODIUM 40 MG PO TABS: 40.0000 mg | ORAL_TABLET | ORAL | Status: DC

## 2014-10-14 NOTE — Progress Notes (Addendum)
Subjective:    Patient ID: Vanessa [name], female    DOB: 11/21/1951, 63 y.o.   MRN: [id number]  HPI Chief Complaint  Patient presents with  . Follow-up    labs needed    This chart was scribed for Lesle ChrisSteven Iyanla Eilers, MD by Andrew Auaven Small, ED Scribe. This patient was seen in room 11 and the patient's care was started at 8:24 AM.  HPI Comments:  Vanessa [name] is a 63 y.o. female with PMHx of HTN, hyperlipidemia, and depression who presents to the [facility] here for a follow up. Pt is here to obtain labs and check HTN. Pt is currently taking 20mg  lisinopril, 20 mg celexa, and a half xanax every night before bed. Pt has not taken HTN medication or eaten this morning.  Pt does not use inhaler or use Wellbutrin. Pt reports she has lost weight since last visit. Pt has not been exercise due to a past knee injury. Pt states she UTD on her mammogram and colonoscopy and reports an upcoming visit with GYN for a physical. Pt reports she snores and has done so for a long time. She is considering a sleep study but not sure she can afford it.  Patient Active Problem List   Diagnosis Date Noted  . Hypertension 03/09/2012  . Hyperlipidemia 03/09/2012  . Depression 03/09/2012   Past Medical History  Diagnosis Date  . Depression   . Anxiety   . Hypertension   . Arthritis    Past Surgical History  Procedure Laterality Date  . Fracture surgery     No Known Allergies Prior to Admission medications   Medication Sig Start Date End Date Taking? Authorizing Provider  albuterol (PROVENTIL HFA;VENTOLIN HFA) 108 (90 BASE) MCG/ACT inhaler Inhale 2 puffs into the lungs every 4 (four) hours as needed for wheezing (cough, shortness of breath or wheezing.). 12/16/13  Yes Collene GobbleSteven A Kiana Hollar, MD  ALPRAZolam Prudy Feeler(XANAX) 1 MG tablet TAKE ONE TABLET BY MOUTH ONCE DAILY AT BEDTIME AS NEEDED 08/07/14  Yes Collene GobbleSteven A Laynie Espy, MD  citalopram (CELEXA) 20 MG tablet Take one tablet daily 12/16/13  Yes Collene GobbleSteven A [place] Zaragoza, MD    lisinopril (PRINIVIL,ZESTRIL) 20 MG tablet Take one half tablet a day for blood pressure control 12/16/13  Yes Collene GobbleSteven A Paitlyn Mcclatchey, MD  traMADol (ULTRAM) 50 MG tablet Take 50 mg by mouth every 6 (six) hours as needed.   Yes Historical Provider, MD  buPROPion (WELLBUTRIN XL) 150 MG 24 hr tablet Take 150 mg by mouth daily.    Historical Provider, MD  pravastatin (PRAVACHOL) 40 MG tablet Take 1 tablet (40 mg total) by mouth 1 day or 1 dose. 12/16/13   Collene GobbleSteven A Julieanna Geraci, MD   History   Social History  . Marital Status: Married    Spouse Name: N/A    Number of Children: N/A  . Years of Education: N/A   Occupational History  . Not on file.   Social History Main Topics  . Smoking status: Never Smoker   . Smokeless tobacco: Never Used  . Alcohol Use: No  . Drug Use: No  . Sexual Activity: Yes    Birth Control/ Protection: None   Other Topics Concern  . Not on file   Social History Narrative   Review of Systems  Objective:   Physical Exam  CONSTITUTIONAL: Well developed/well nourished HEAD: Normocephalic/atraumatic EYES: EOMI/PERRL ENMT: Mucous membranes moist NECK: supple no meningeal signs SPINE:entire spine nontender CV: S1/S2 noted, no murmurs/rubs/gallops noted  LUNGS: Lungs are clear to auscultation bilaterally, no apparent distress ABDOMEN: soft, nontender, no rebound or guarding GU:no cva tenderness NEURO: Pt is awake/alert, moves all extremitiesx4 EXTREMITIES: pulses normal, full ROM SKIN: warm, color normal PSYCH: no abnormalities of mood noted  Filed Vitals:   10/14/14 0814  BP: 118/80  Pulse: 75  Temp: 97.6 F (36.4 C)  TempSrc: Oral  Resp: 18  Height: 5' 6.5" (1.689 m)  Weight: 190 lb (86.183 kg)  SpO2: 98%   Results for orders placed or performed in visit on 10/14/14  POCT CBC  Result Value Ref Range   WBC 4.9 4.6 - 10.2 K/uL   Lymph, poc 1.7 0.6 - 3.4   POC LYMPH PERCENT 34.9 10 - 50 %L   MID (cbc) 0.5 0 - 0.9   POC MID % 9.2 0 - 12 %M   POC Granulocyte  2.7 2 - 6.9   Granulocyte percent 55.9 37 - 80 %G   RBC 4.86 4.04 - 5.48 M/uL   Hemoglobin 14.7 12.2 - 16.2 g/dL   HCT, POC 84.145.7 32.437.7 - 47.9 %   MCV 94.2 80 - 97 fL   MCH, POC 30.3 27 - 31.2 pg   MCHC 32.2 31.8 - 35.4 g/dL   RDW, POC 40.114.4 %   Platelet Count, POC 152 142 - 424 K/uL   MPV 8.8 0 - 99.8 fL   Assessment & Plan:  Patient currently off of Wellbutrin. She does have a significant amount of stress at home with her husband not working and recent increase in her health insurance premiums that she will not be able to afford. I do feel she should stay on the Celexa. She currently takes Xanax half to 1 tablet at night. She has been off her pravastatin and is awaiting her cholesterol results. She currently is on lisinopril 20 one a day without side effects. Her blood pressures at different visits have been well controlled. She does have snoring and I do believe a sleep study is indicated.she was given a refill of her Ultram she takes for knee pain.she refused a flu shot  I personally performed the services described in this documentation, which was scribed in my presence. The recorded information has been reviewed and is accurate.

## 2014-10-16 ENCOUNTER — Encounter: Payer: Self-pay | Admitting: Radiology

## 2014-10-16 ENCOUNTER — Other Ambulatory Visit: Payer: Self-pay | Admitting: Emergency Medicine

## 2014-10-16 ENCOUNTER — Telehealth: Payer: Self-pay

## 2014-10-16 DIAGNOSIS — G4710 Hypersomnia, unspecified: Secondary | ICD-10-CM

## 2014-10-16 DIAGNOSIS — R0683 Snoring: Secondary | ICD-10-CM

## 2014-10-16 MED ORDER — ATORVASTATIN CALCIUM 40 MG PO TABS: 40.0000 mg | ORAL_TABLET | Freq: Every day | ORAL | Status: DC

## 2014-10-16 NOTE — Telephone Encounter (Signed)
Pt called. She said she was at the pharm and their were 2 rx's waiting for her; simvastatin and atorvastatin. Advised to p/u Lipitor since this is the one Dr. Cleta Albertsaub sent to the pharm today.

## 2014-12-28 ENCOUNTER — Other Ambulatory Visit: Payer: Self-pay

## 2014-12-28 DIAGNOSIS — I10 Essential (primary) hypertension: Secondary | ICD-10-CM

## 2014-12-28 MED ORDER — LISINOPRIL 20 MG PO TABS: ORAL_TABLET | ORAL | Status: DC

## 2014-12-28 NOTE — Telephone Encounter (Signed)
Pharm sent req for new Rx for lisinopril 20 mg. Pt is advising that she is supposed to be taking 1 whole tablet QD. Dr Cleta Albertsaub, I see in your last OV notes in the HPI sec you write that pt is taking 20 mg of lisinopril QD, but the med list for visit still has 1/2 tablet written. I have pended Rx for 1 tab as pt advises, but wanted to check with you.

## 2015-04-19 ENCOUNTER — Other Ambulatory Visit: Payer: Self-pay | Admitting: Emergency Medicine

## 2015-04-22 NOTE — Telephone Encounter (Signed)
Faxed

## 2015-05-27 ENCOUNTER — Encounter: Payer: Self-pay | Admitting: *Deleted

## 2015-06-10 ENCOUNTER — Other Ambulatory Visit: Payer: Self-pay | Admitting: Emergency Medicine

## 2015-06-11 NOTE — Telephone Encounter (Signed)
Faxed

## 2015-07-23 ENCOUNTER — Other Ambulatory Visit: Payer: Self-pay | Admitting: Emergency Medicine

## 2015-07-25 ENCOUNTER — Other Ambulatory Visit: Payer: Self-pay | Admitting: Physician Assistant

## 2015-07-25 ENCOUNTER — Telehealth: Payer: Self-pay

## 2015-07-25 MED ORDER — ALPRAZOLAM 1 MG PO TABS: 1.0000 mg | ORAL_TABLET | Freq: Every day | ORAL | Status: DC

## 2015-07-25 NOTE — Telephone Encounter (Signed)
Done.  It is waiting for pickup. Deliah Boston, MS, PA-C   4:41 PM, 07/25/2015

## 2015-07-25 NOTE — Telephone Encounter (Signed)
Patient requests refill on clonopin.   She can come in to see Dr. Cleta Alberts after the 23rd.    [phone] (H)

## 2015-07-25 NOTE — Telephone Encounter (Signed)
Thank you  -Michael

## 2015-07-26 NOTE — Telephone Encounter (Signed)
Pt.notified

## 2015-07-26 NOTE — Telephone Encounter (Signed)
Rx faxed

## 2015-08-09 ENCOUNTER — Ambulatory Visit (INDEPENDENT_AMBULATORY_CARE_PROVIDER_SITE_OTHER): Payer: 59 | Admitting: Emergency Medicine

## 2015-08-09 VITALS — BP 122/72 | HR 70 | Temp 97.4°F | Resp 18 | Ht 66.5 in | Wt 199.0 lb

## 2015-08-09 DIAGNOSIS — G4700 Insomnia, unspecified: Secondary | ICD-10-CM | POA: Diagnosis not present

## 2015-08-09 DIAGNOSIS — F329 Major depressive disorder, single episode, unspecified: Secondary | ICD-10-CM | POA: Diagnosis not present

## 2015-08-09 DIAGNOSIS — E785 Hyperlipidemia, unspecified: Secondary | ICD-10-CM

## 2015-08-09 DIAGNOSIS — R55 Syncope and collapse: Secondary | ICD-10-CM

## 2015-08-09 DIAGNOSIS — I10 Essential (primary) hypertension: Secondary | ICD-10-CM

## 2015-08-09 DIAGNOSIS — F32A Depression, unspecified: Secondary | ICD-10-CM

## 2015-08-09 LAB — COMPLETE METABOLIC PANEL WITH GFR
ALT: 16 U/L (ref 6–29)
AST: 20 U/L (ref 10–35)
Albumin: 4.1 g/dL (ref 3.6–5.1)
Alkaline Phosphatase: 82 U/L (ref 33–130)
BUN: 18 mg/dL (ref 7–25)
CHLORIDE: 108 mmol/L (ref 98–110)
CO2: 25 mmol/L (ref 20–31)
CREATININE: 0.68 mg/dL (ref 0.50–0.99)
Calcium: 9.1 mg/dL (ref 8.6–10.4)
GFR, Est African American: >89 mL/min (ref >=60)
GFR, Est Non African American: >89 mL/min (ref >=60)
GLUCOSE: 88 mg/dL (ref 65–99)
Potassium: 3.9 mmol/L (ref 3.5–5.3)
Sodium: 141 mmol/L (ref 135–146)
Total Bilirubin: 0.7 mg/dL (ref 0.2–1.2)
Total Protein: 6.5 g/dL (ref 6.1–8.1)

## 2015-08-09 LAB — POCT CBC
Granulocyte percent: 55.6 %G (ref 37–80)
HEMATOCRIT: 42.7 % (ref 37.7–47.9)
Hemoglobin: 13.8 g/dL (ref 12.2–16.2)
Lymph, poc: 1.3 (ref 0.6–3.4)
MCH, POC: 29.1 pg (ref 27–31.2)
MCHC: 32.2 g/dL (ref 31.8–35.4)
MCV: 90.5 fL (ref 80–97)
MID (CBC): 0.3 (ref 0–0.9)
MPV: 8.4 fL (ref 0–99.8)
PLATELET COUNT, POC: 183 10*3/uL (ref 142–424)
POC Granulocyte: 2.1 (ref 2–6.9)
POC LYMPH PERCENT: 35.5 %L (ref 10–50)
POC MID %: 8.9 %M (ref 0–12)
RBC: 4.72 M/uL (ref 4.04–5.48)
RDW, POC: 13.5 %
WBC: 3.8 10*3/uL — AB (ref 4.6–10.2)

## 2015-08-09 LAB — LIPID PANEL
Cholesterol: 145 mg/dL (ref 125–200)
HDL: 61 mg/dL (ref >=46)
LDL CALC: 68 mg/dL (ref <130)
Total CHOL/HDL Ratio: 2.4 Ratio (ref <=5.0)
Triglycerides: 78 mg/dL (ref <150)
VLDL: 16 mg/dL (ref <30)

## 2015-08-09 LAB — GLUCOSE, POCT (MANUAL RESULT ENTRY): POC Glucose: 99 mg/dl (ref 70–99)

## 2015-08-09 LAB — POCT GLYCOSYLATED HEMOGLOBIN (HGB A1C): HEMOGLOBIN A1C: 5.3

## 2015-08-09 MED ORDER — ALPRAZOLAM 1 MG PO TABS: 1.0000 mg | ORAL_TABLET | Freq: Every day | ORAL | Status: DC

## 2015-08-09 MED ORDER — LISINOPRIL 20 MG PO TABS: ORAL_TABLET | ORAL | Status: DC

## 2015-08-09 MED ORDER — HYDROCODONE-ACETAMINOPHEN 5-325 MG PO TABS: ORAL_TABLET | ORAL | Status: DC

## 2015-08-09 MED ORDER — ATORVASTATIN CALCIUM 40 MG PO TABS: 40.0000 mg | ORAL_TABLET | Freq: Every day | ORAL | Status: DC

## 2015-08-09 MED ORDER — CITALOPRAM HYDROBROMIDE 20 MG PO TABS: ORAL_TABLET | ORAL | Status: DC

## 2015-08-09 NOTE — Progress Notes (Addendum)
Patient ID: [name], female   DOB: 17-Mar-1951, 64 y.o.   MRN: [id number]    This chart was scribed for Vanessa Lites, MD by [facility], medical scribe at [facility].The patient was seen in exam room 11 and the patient's care was started at 8:20 AM.  Chief Complaint:  Chief Complaint  Patient presents with  . Medication Refill    xanax, tramadol   . Follow-up    labs   HPI: Vanessa [name] is a 64 y.o. female who reports to [facility] today complaining of medication refill and a follow up for labs. Today, she would like to check her A1C. Gained a few pounds recently so she has been exercising. Declined sleep study in the past, still having trouble sleeping. Only taking xanax. 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 Typically takes ibuprofen. Stressed is down from last visit. Celexa has helped. UTD on health maintenance. She declines the flu shot today. Frequently takes care of her granddaughter who is 63 1/2 years old.  Past Medical History  Diagnosis Date  . Depression   . Anxiety   . Hypertension   . Arthritis    Past Surgical History  Procedure Laterality Date  . Fracture surgery     Social History   Social History  . Marital Status: Married    Spouse Name: N/A  . Number of Children: N/A  . Years of Education: N/A   Social History Main Topics  . Smoking status: Never Smoker   . Smokeless tobacco: Never Used  . Alcohol Use: No  . Drug Use: No  . Sexual Activity: Yes    Birth Control/ Protection: None   Other Topics Concern  . None   Social History Narrative   Family History  Problem Relation Age of Onset  . Cancer Mother    No Known Allergies Prior to Admission medications   Medication Sig Start Date End Date Taking? Authorizing Provider  albuterol (PROVENTIL HFA;VENTOLIN HFA) 108 (90 BASE) MCG/ACT inhaler Inhale 2 puffs into the lungs every 4 (four) hours as needed for wheezing  (cough, shortness of breath or wheezing.). 12/16/13  Yes Collene Gobble, MD  ALPRAZolam Prudy Feeler) 1 MG tablet Take 1 tablet (1 mg total) by mouth at bedtime. 07/25/15  Yes Ofilia Neas, PA-C  atorvastatin (LIPITOR) 40 MG tablet Take 1 tablet (40 mg total) by mouth daily. 10/16/14  Yes Collene Gobble, MD  citalopram (CELEXA) 20 MG tablet Take one tablet daily 10/14/14  Yes Collene Gobble, MD  lisinopril (PRINIVIL,ZESTRIL) 20 MG tablet Take 1 tablet a day for blood pressure control 12/28/14  Yes Collene Gobble, MD  traMADol (ULTRAM) 50 MG tablet Take 1 tablet (50 mg total) by mouth every 6 (six) hours as needed. 10/14/14  Yes Collene Gobble, MD   ROS: The patient denies fevers, chills, night sweats, unintentional weight loss, chest pain, palpitations, wheezing, dyspnea on exertion, nausea, vomiting, abdominal pain, dysuria, hematuria, melena, numbness, weakness, or tingling.  All other systems have been reviewed and were otherwise negative with the exception of those mentioned in the HPI and as above.    PHYSICAL EXAM: Filed Vitals:   08/09/15 0817  BP: 122/72  Pulse: 70  Temp: 97.4 F (36.3 C)  Resp: 18   Body mass index is 31.64 kg/(m^2).  General: Alert, no acute distress HEENT:  Normocephalic, atraumatic, oropharynx patent. Eye: Everitt Amber Cardiovascular:  Regular  rate and rhythm, no rubs murmurs or gallops.  No Carotid bruits, radial pulse intact. No pedal edema.  Respiratory: Clear to auscultation bilaterally.  No wheezes, rales, or rhonchi.  No cyanosis, no use of accessory musculature Abdominal: No organomegaly, abdomen is soft and non-tender, positive bowel sounds.  No masses. Musculoskeletal: Gait intact. No edema, tenderness Skin: No rashes. Neurologic: Facial musculature symmetric. Psychiatric: Patient acts appropriately throughout our interaction. Lymphatic: No cervical or submandibular lymphadenopathy  LABS: Results for orders placed or performed in visit on 08/09/15  POCT  glucose (manual entry)  Result Value Ref Range   POC Glucose 99 70 - 99 mg/dl  POCT glycosylated hemoglobin (Hb A1C)  Result Value Ref Range   Hemoglobin A1C 5.3   POCT CBC  Result Value Ref Range   WBC 3.8 (A) 4.6 - 10.2 K/uL   Lymph, poc 1.3 0.6 - 3.4   POC LYMPH PERCENT 35.5 10 - 50 %L   MID (cbc) 0.3 0 - 0.9   POC MID % 8.9 0 - 12 %M   POC Granulocyte 2.1 2 - 6.9   Granulocyte percent 55.6 37 - 80 %G   RBC 4.72 4.04 - 5.48 M/uL   Hemoglobin 13.8 12.2 - 16.2 g/dL   HCT, POC 91.4 78.2 - 47.9 %   MCV 90.5 80 - 97 fL   MCH, POC 29.1 27 - 31.2 pg   MCHC 32.2 31.8 - 35.4 g/dL   RDW, POC 95.6 %   Platelet Count, POC 183 142 - 424 K/uL   MPV 8.4 0 - 99.8 fL   ASSESSMENT/PLAN: White count is slightly low with a normal platelet and hemoglobin count we'll monitor this for now. Repeat in about 4 months. She is not a diabetic and will be reassured about this. I changed her from tramadol to hydrocodone because of the serotonin syndrome risk. Gross sideeffects, risk and benefits, and alternatives of medications d/w patient. Patient is aware that all medications have potential sideeffects and we are unable to predict every sideeffect or drug-drug interaction that may occur.I personally performed the services described in this documentation, which was scribed in my presence. The recorded information has been reviewed and is accurate.    [name] 08/09/2015 8:55 AM

## 2015-12-07 ENCOUNTER — Other Ambulatory Visit: Payer: Self-pay | Admitting: Emergency Medicine

## 2015-12-09 ENCOUNTER — Telehealth: Payer: Self-pay | Admitting: Family Medicine

## 2015-12-09 NOTE — Telephone Encounter (Signed)
Faxed Xanax to pharmacy

## 2016-01-13 ENCOUNTER — Ambulatory Visit (INDEPENDENT_AMBULATORY_CARE_PROVIDER_SITE_OTHER): Payer: BLUE CROSS/BLUE SHIELD | Admitting: Emergency Medicine

## 2016-01-13 VITALS — BP 128/82 | HR 72 | Temp 98.2°F | Resp 18 | Wt 207.2 lb

## 2016-01-13 DIAGNOSIS — R0683 Snoring: Secondary | ICD-10-CM

## 2016-01-13 DIAGNOSIS — G4700 Insomnia, unspecified: Secondary | ICD-10-CM | POA: Diagnosis not present

## 2016-01-13 DIAGNOSIS — E785 Hyperlipidemia, unspecified: Secondary | ICD-10-CM | POA: Diagnosis not present

## 2016-01-13 DIAGNOSIS — R062 Wheezing: Secondary | ICD-10-CM

## 2016-01-13 DIAGNOSIS — I10 Essential (primary) hypertension: Secondary | ICD-10-CM

## 2016-01-13 DIAGNOSIS — F32A Depression, unspecified: Secondary | ICD-10-CM

## 2016-01-13 DIAGNOSIS — Z1159 Encounter for screening for other viral diseases: Secondary | ICD-10-CM | POA: Diagnosis not present

## 2016-01-13 DIAGNOSIS — F329 Major depressive disorder, single episode, unspecified: Secondary | ICD-10-CM | POA: Diagnosis not present

## 2016-01-13 DIAGNOSIS — R21 Rash and other nonspecific skin eruption: Secondary | ICD-10-CM | POA: Diagnosis not present

## 2016-01-13 LAB — LIPID PANEL
CHOL/HDL RATIO: 2.2 ratio (ref <=5.0)
CHOLESTEROL: 131 mg/dL (ref 125–200)
HDL: 60 mg/dL (ref >=46)
LDL Cholesterol: 58 mg/dL (ref <130)
TRIGLYCERIDES: 65 mg/dL (ref <150)
VLDL: 13 mg/dL (ref <30)

## 2016-01-13 LAB — POCT CBC
Granulocyte percent: 59.2 %G (ref 37–80)
HCT, POC: 40.6 % (ref 37.7–47.9)
HEMOGLOBIN: 14.2 g/dL (ref 12.2–16.2)
LYMPH, POC: 1.6 (ref 0.6–3.4)
MCH, POC: 31.1 pg (ref 27–31.2)
MCHC: 35.1 g/dL (ref 31.8–35.4)
MCV: 88.8 fL (ref 80–97)
MID (cbc): 0.4 (ref 0–0.9)
MPV: 8.5 fL (ref 0–99.8)
POC Granulocyte: 2.8 (ref 2–6.9)
POC LYMPH PERCENT: 33.3 %L (ref 10–50)
POC MID %: 7.5 % (ref 0–12)
Platelet Count, POC: 118 10*3/uL — AB (ref 142–424)
RBC: 4.57 M/uL (ref 4.04–5.48)
RDW, POC: 13.3 %
WBC: 4.8 10*3/uL (ref 4.6–10.2)

## 2016-01-13 LAB — COMPREHENSIVE METABOLIC PANEL
ALBUMIN: 4 g/dL (ref 3.6–5.1)
ALT: 11 U/L (ref 6–29)
AST: 16 U/L (ref 10–35)
Alkaline Phosphatase: 78 U/L (ref 33–130)
BUN: 18 mg/dL (ref 7–25)
CALCIUM: 9.2 mg/dL (ref 8.6–10.4)
CHLORIDE: 105 mmol/L (ref 98–110)
CO2: 30 mmol/L (ref 20–31)
Creat: 0.67 mg/dL (ref 0.50–0.99)
Glucose, Bld: 97 mg/dL (ref 65–99)
POTASSIUM: 3.6 mmol/L (ref 3.5–5.3)
Sodium: 140 mmol/L (ref 135–146)
TOTAL PROTEIN: 6.4 g/dL (ref 6.1–8.1)
Total Bilirubin: 0.8 mg/dL (ref 0.2–1.2)

## 2016-01-13 LAB — POCT SKIN KOH: Skin KOH, POC: NEGATIVE

## 2016-01-13 LAB — TSH: TSH: 3.613 u[IU]/mL (ref 0.350–4.500)

## 2016-01-13 LAB — HEPATITIS C ANTIBODY: HCV AB: NEGATIVE

## 2016-01-13 MED ORDER — LISINOPRIL 20 MG PO TABS: ORAL_TABLET | ORAL | Status: DC

## 2016-01-13 MED ORDER — ATORVASTATIN CALCIUM 40 MG PO TABS: 40.0000 mg | ORAL_TABLET | Freq: Every day | ORAL | Status: DC

## 2016-01-13 MED ORDER — ALPRAZOLAM 1 MG PO TABS: 1.0000 mg | ORAL_TABLET | Freq: Every day | ORAL | Status: DC

## 2016-01-13 MED ORDER — ESCITALOPRAM OXALATE 10 MG PO TABS: 10.0000 mg | ORAL_TABLET | Freq: Every day | ORAL | Status: DC

## 2016-01-13 MED ORDER — ALBUTEROL SULFATE HFA 108 (90 BASE) MCG/ACT IN AERS: 2.0000 | INHALATION_SPRAY | RESPIRATORY_TRACT | Status: DC | PRN

## 2016-01-13 NOTE — Progress Notes (Addendum)
By signing my name below, I, Raven Small, attest that this documentation has been prepared under the direction and in the presence of Lesle Chris, MD.  Electronically Signed: Andrew Au, ED Scribe. 01/13/2016. 8:12 AM.  Chief Complaint:  Chief Complaint  Patient presents with  . Follow-up    for HTN and lab work    HPI: Vanessa [name] is a 66 y.o. female who reports to [facility] today for a a medication refill and follow up. She was last seen 08/09/15. She does not take medications regularly and has some medication left over. Pt states celexa is not working like it once was and would like a medication change back to lexapro. She denies worsening depression and just feels she does better on Lexapro. She is UTD on mammogram and colonoscopy. She did not have a sleep study due to insurance reasons. She now has better insurance and will check to see if insurance will cover sleep study. She takes  xanax. She does not exercise regularly. She declines flu shot today.   She also mentions having a rash to neck. She has been using medication that her husband had left over for a fungus.   Past Medical History  Diagnosis Date  . Depression   . Anxiety   . Hypertension   . Arthritis    Past Surgical History  Procedure Laterality Date  . Fracture surgery     Social History   Social History  . Marital Status: Married    Spouse Name: N/A  . Number of Children: N/A  . Years of Education: N/A   Social History Main Topics  . Smoking status: Never Smoker   . Smokeless tobacco: Never Used  . Alcohol Use: No  . Drug Use: No  . Sexual Activity: Yes    Birth Control/ Protection: None   Other Topics Concern  . Not on file   Social History Narrative   Family History  Problem Relation Age of Onset  . Cancer Mother    No Known Allergies Prior to Admission medications   Medication Sig Start Date End Date Taking? Authorizing Provider  albuterol (PROVENTIL HFA;VENTOLIN HFA) 108 (90 BASE)  MCG/ACT inhaler Inhale 2 puffs into the lungs every 4 (four) hours as needed for wheezing (cough, shortness of breath or wheezing.). 12/16/13   Collene Gobble, MD  ALPRAZolam Prudy Feeler) 1 MG tablet TAKE ONE TABLET BY MOUTH AT BEDTIME 12/09/15   Collene Gobble, MD  atorvastatin (LIPITOR) 40 MG tablet Take 1 tablet (40 mg total) by mouth daily. 08/09/15   Collene Gobble, MD  citalopram (CELEXA) 20 MG tablet Take one tablet daily 08/09/15   Collene Gobble, MD  HYDROcodone-acetaminophen [facility]) 5-325 MG per tablet One half tablet every 6 hours as needed for pain 08/09/15   Collene Gobble, MD  lisinopril (PRINIVIL,ZESTRIL) 20 MG tablet Take 1 tablet a day for blood pressure control 08/09/15   Collene Gobble, MD     ROS: The patient denies fevers, chills, night sweats, unintentional weight loss, chest pain, palpitations, wheezing, dyspnea on exertion, nausea, vomiting, abdominal pain, dysuria, hematuria, melena, numbness, weakness, or tingling.   All other systems have been reviewed and were otherwise negative with the exception of those mentioned in the HPI and as above.    PHYSICAL EXAM: Filed Vitals:   01/13/16 0812  BP: 128/82  Pulse: 72  Temp: 98.2 F (36.8 C)  Resp: 18   Body mass index is 32.95 kg/(m^2).  General: Alert, no acute distress. Overweight. HEENT:  Normocephalic, atraumatic, oropharynx patent. Eye: [facility] Cardiovascular:  Regular rate and rhythm, no rubs murmurs or gallops.  No Carotid bruits, radial pulse intact. No pedal edema.  Respiratory: Clear to auscultation bilaterally.  No wheezes, rales, or rhonchi.  No cyanosis, no use of accessory musculature Abdominal: No organomegaly, abdomen is soft and non-tender, positive bowel sounds.  No masses. Musculoskeletal: Gait intact. No edema, tenderness Skin: No rashes. Raised scaly rash right lower neck area anteriorly.  Neurologic: Facial musculature symmetric. Psychiatric: Patient acts appropriately throughout our  interaction. Lymphatic: No cervical or submandibular lymphadenopathy  LABS: Results for orders placed or performed in visit on 01/13/16  POCT CBC  Result Value Ref Range   WBC 4.8 4.6 - 10.2 K/uL   Lymph, poc 1.6 0.6 - 3.4   POC LYMPH PERCENT 33.3 10 - 50 %L   MID (cbc) 0.4 0 - 0.9   POC MID % 7.5 0 - 12 %M   POC Granulocyte 2.8 2 - 6.9   Granulocyte percent 59.2 37 - 80 %G   RBC 4.57 4.04 - 5.48 M/uL   Hemoglobin 14.2 12.2 - 16.2 g/dL   HCT, POC 09.8 11.9 - 47.9 %   MCV 88.8 80 - 97 fL   MCH, POC 31.1 27 - 31.2 pg   MCHC 35.1 31.8 - 35.4 g/dL   RDW, POC 14.7 %   Platelet Count, POC 118 (A) 142 - 424 K/uL   MPV 8.5 0 - 99.8 fL  POCT Skin KOH  Result Value Ref Range   Skin KOH, POC Negative    ASSESSMENT/PLAN: Blood pressure is at goal. Her depression has not been as well treated with Celexa as it was with Lexapro. Will change her back to Lexapro. She will wean off of Celexa over the next 2 weeks and then start Lexapro 10 mg daily. Other medications were refilled. She will continue on Xanax 1/2-1 at bedtime.I personally performed the services described in this documentation, which was scribed in my presence. The recorded information has been reviewed and is accurate. There is a rash on her anterior chest negative KOH she can use cortisone on this. Platelets were also slightly low at 118,000. I suggested we repeat this in 6 weeks.Michaell Cowing sideeffects, risk and benefits, and alternatives of medications d/w patient. Patient is aware that all medications have potential sideeffects and we are unable to predict every sideeffect or drug-drug interaction that may occur.  [name] 01/13/2016 8:12 AM

## 2016-01-13 NOTE — Patient Instructions (Signed)
Take your Celexa one half tablet a day for one week then one half tablet every other day for one week. Start Lexapro in 2 weeks. Your platelet count is slightly low you need to return to clinic in 6 weeks for repeat.

## 2016-01-14 ENCOUNTER — Encounter: Payer: Self-pay | Admitting: *Deleted

## 2016-02-24 ENCOUNTER — Ambulatory Visit (INDEPENDENT_AMBULATORY_CARE_PROVIDER_SITE_OTHER): Payer: BLUE CROSS/BLUE SHIELD | Admitting: Emergency Medicine

## 2016-02-24 VITALS — BP 118/82 | HR 93 | Temp 98.3°F | Resp 18 | Ht 66.5 in | Wt 208.0 lb

## 2016-02-24 DIAGNOSIS — F329 Major depressive disorder, single episode, unspecified: Secondary | ICD-10-CM | POA: Diagnosis not present

## 2016-02-24 DIAGNOSIS — I10 Essential (primary) hypertension: Secondary | ICD-10-CM | POA: Diagnosis not present

## 2016-02-24 DIAGNOSIS — D696 Thrombocytopenia, unspecified: Secondary | ICD-10-CM | POA: Diagnosis not present

## 2016-02-24 DIAGNOSIS — M542 Cervicalgia: Secondary | ICD-10-CM

## 2016-02-24 DIAGNOSIS — F32A Depression, unspecified: Secondary | ICD-10-CM

## 2016-02-24 LAB — POCT CBC
Granulocyte percent: 26.8 %G — AB (ref 37–80)
HEMATOCRIT: 40.4 % (ref 37.7–47.9)
HEMOGLOBIN: 14.4 g/dL (ref 12.2–16.2)
LYMPH, POC: 1.6 (ref 0.6–3.4)
MCH, POC: 32.1 pg — AB (ref 27–31.2)
MCHC: 35.8 g/dL — AB (ref 31.8–35.4)
MCV: 89.7 fL (ref 80–97)
MID (cbc): 0.4 (ref 0–0.9)
MPV: 8.6 fL (ref 0–99.8)
POC Granulocyte: 2.6 (ref 2–6.9)
POC LYMPH %: 35.3 % (ref 10–50)
POC MID %: 7.9 % (ref 0–12)
Platelet Count, POC: 121 10*3/uL — AB (ref 142–424)
RBC: 4.5 M/uL (ref 4.04–5.48)
RDW, POC: 13.3 %
WBC: 4.5 10*3/uL — AB (ref 4.6–10.2)

## 2016-02-24 MED ORDER — HYDROCODONE-ACETAMINOPHEN 5-325 MG PO TABS: ORAL_TABLET | ORAL | Status: DC

## 2016-02-24 NOTE — Patient Instructions (Addendum)
Thrombocytopenia °Thrombocytopenia is a condition in which there is an abnormally small number of platelets in your blood. Platelets are also called thrombocytes. Platelets are needed for blood clotting. °CAUSES °Thrombocytopenia is caused by:  °· Decreased production of platelets. This can be caused by: °¨ Aplastic anemia in which your bone marrow quits making blood cells. °¨ Cancer in the bone marrow. °¨ Use of certain medicines, including chemotherapy. °¨ Infection in the bone marrow. °¨ Heavy alcohol consumption. °· Increased destruction of platelets. This can be caused by: °¨ Certain immune diseases. °¨ Use of certain drugs. °¨ Certain blood clotting disorders. °¨ Certain inherited disorders. °¨ Certain bleeding disorders. °¨ Pregnancy. °· Having an enlarged spleen (hypersplenism). In hypersplenism, the spleen gathers up platelets from circulation. This means the platelets are not available to help with blood clotting. The spleen can enlarge due to cirrhosis or other conditions. °SYMPTOMS  °The symptoms of thrombocytopenia are side effects of poor blood clotting. Some of these are: °· Abnormal bleeding. °· Nosebleeds. °· Heavy menstrual periods. °· Blood in the urine or stools. °· Purpura. This is a purplish discoloration in the skin produced by small bleeding vessels near the surface of the skin. °· Bruising. °· A rash that may be petechial. This looks like pinpoint, purplish-red spots on the skin and mucous membranes. It is caused by bleeding from small blood vessels (capillaries). °DIAGNOSIS  °Your caregiver will make this diagnosis based on your exam and blood tests. Sometimes, a bone marrow study is done to look for the original cells (megakaryocytes) that make platelets. °TREATMENT  °Treatment depends on the cause of the condition. °· Medicines may be given to help protect your platelets from being destroyed. °· In some cases, a replacement (transfusion) of platelets may be required to stop or prevent  bleeding. °· Sometimes, the spleen must be surgically removed. °HOME CARE INSTRUCTIONS  °· Check the skin and linings inside your mouth for bruising or bleeding as directed by your caregiver. °· Check your sputum, urine, and stool for blood as directed by your caregiver. °· Do not return to any activities that could cause bumps or bruises until your caregiver says it is okay. °· Take extra care not to cut yourself when shaving or when using scissors, needles, knives, and other tools. °· Take extra care not to burn yourself when ironing or cooking. °· Ask your caregiver if it is okay for you to drink alcohol. °· Only take over-the-counter or prescription medicines as directed by your caregiver. °· Notify all your caregivers, including dentists and eye doctors, about your condition. °SEEK IMMEDIATE MEDICAL CARE IF:  °· You develop active bleeding from anywhere in your body. °· You develop unexplained bruising or bleeding. °· You have blood in your sputum, urine, or stool. °MAKE SURE YOU: °· Understand these instructions. °· Will watch your condition. °· Will get help right away if you are not doing well or get worse. °  °This information is not intended to replace advice given to you by your health care provider. Make sure you discuss any questions you have with your health care provider. °  °Document Released: 11/30/2005 Document Revised: 02/22/2012 Document Reviewed: 06/03/2015 °Elsevier Interactive Patient Education ©2016 [employer] ° °

## 2016-02-24 NOTE — Progress Notes (Signed)
Subjective:  This chart was scribed for  Vanessa Chris MD, by [name], at [facility].  This patient was seen in room  1 and the patient's care was started at 8:38 AM.   Chief Complaint  Patient presents with  . Follow-up    lab work     Patient ID: Vanessa [name], female    DOB: [birth year], 65 y.o.   MRN: [id number]  HPI HPI Comments: Vanessa [name] is a 65 y.o. female who presents to the [facility] for a follow up regarding her lab work.  She states that she is compliant with her medications but is running out of hydrocodone which she takes due to her arthritis.  She takes 1/2 of a xanax at night time (some nights) and has not had to use her inhaler for a while.  She is compliant with her Lipitor and Lexapro. She is compliant with her blood pressure medication.  Patient enjoys her job and states that she is much happier now compared to when she was working at an [employer].   Patient has no concerns today.  Platelet count at last visit was 118 so she is here to have this re-checked today.   Patient Active Problem List   Diagnosis Date Noted  . Insomnia 08/09/2015  . Snoring 10/14/2014  . Hypertension 03/09/2012  . Hyperlipidemia 03/09/2012  . Depression 03/09/2012   Past Medical History  Diagnosis Date  . Depression   . Anxiety   . Hypertension   . Arthritis    Past Surgical History  Procedure Laterality Date  . Fracture surgery     No Known Allergies Prior to Admission medications   Medication Sig Start Date End Date Taking? Authorizing Provider  albuterol (PROVENTIL HFA;VENTOLIN HFA) 108 (90 Base) MCG/ACT inhaler Inhale 2 puffs into the lungs every 4 (four) hours as needed for wheezing (cough, shortness of breath or wheezing.). 01/13/16  Yes Collene Gobble, MD  ALPRAZolam Prudy Feeler) 1 MG tablet Take 1 tablet (1 mg total) by mouth at bedtime. 01/13/16  Yes Collene Gobble, MD  atorvastatin (LIPITOR) 40 MG tablet  Take 1 tablet (40 mg total) by mouth daily. 01/13/16  Yes Collene Gobble, MD  escitalopram (LEXAPRO) 10 MG tablet Take 1 tablet (10 mg total) by mouth daily. 01/13/16  Yes Collene Gobble, MD  HYDROcodone-acetaminophen [facility]) 5-325 MG per tablet One half tablet every 6 hours as needed for pain 08/09/15  Yes Collene Gobble, MD  lisinopril (PRINIVIL,ZESTRIL) 20 MG tablet Take 1 tablet a day for blood pressure control 01/13/16  Yes Collene Gobble, MD   Social History   Social History  . Marital Status: Married    Spouse Name: N/A  . Number of Children: N/A  . Years of Education: N/A   Occupational History  . Not on file.   Social History Main Topics  . Smoking status: Never Smoker   . Smokeless tobacco: Never Used  . Alcohol Use: No  . Drug Use: No  . Sexual Activity: Yes    Birth Control/ Protection: None   Other Topics Concern  . Not on file   Social History Narrative    Review of Systems  Constitutional: Negative for fever and chills.  Eyes: Negative for pain, redness and itching.  Respiratory: Negative for cough and shortness of breath.   Gastrointestinal: Negative for nausea and vomiting.  Musculoskeletal: Negative for neck pain and neck stiffness.  Skin: Negative for color change.  Neurological: Negative for seizures, syncope and speech difficulty.       Objective:   Physical Exam CONSTITUTIONAL: Well developed/well nourished HEAD: Normocephalic/atraumatic EYES: EOMI/PERRL ENMT: Mucous membranes moist NECK: She has a little tenderness on the right peri cervical muscles, no mass in that area.  SPINE/BACK:entire spine nontender CV: S1/S2 noted, no murmurs/rubs/gallops noted LUNGS: Lungs are clear to auscultation bilaterally, no apparent distress ABDOMEN: soft, nontender, no rebound or guarding, bowel sounds noted throughout abdomen GU:no cva tenderness NEURO: Pt is awake/alert/appropriate, moves all extremitiesx4.  No facial droop.   EXTREMITIES: pulses normal/equal,  full ROM SKIN: warm, color normal PSYCH: no abnormalities of mood noted, alert and oriented to situation   Filed Vitals:   02/24/16 0822  BP: 118/82  Pulse: 93  Temp: 98.3 F (36.8 C)  Resp: 18  Height: 5' 6.5" (1.689 m)  Weight: 208 lb (94.348 kg)  SpO2: 98%   Results for orders placed or performed in visit on 02/24/16  POCT CBC  Result Value Ref Range   WBC 4.5 (A) 4.6 - 10.2 K/uL   Lymph, poc 1.6 0.6 - 3.4   POC LYMPH PERCENT 35.3 10 - 50 %L   MID (cbc) 0.4 0 - 0.9   POC MID % 7.9 0 - 12 %M   POC Granulocyte 2.6 2 - 6.9   Granulocyte percent 26.8 (A) 37 - 80 %G   RBC 4.50 4.04 - 5.48 M/uL   Hemoglobin 14.4 12.2 - 16.2 g/dL   HCT, POC 40.940.4 81.137.7 - 47.9 %   MCV 89.7 80 - 97 fL   MCH, POC 32.1 (A) 27 - 31.2 pg   MCHC 35.8 (A) 31.8 - 35.4 g/dL   RDW, POC 91.413.3 %   Platelet Count, POC 121 (A) 142 - 424 K/uL   MPV 8.6 0 - 99.8 fL      Assessment & Plan:  Patient continues to have low white count and low platelet count. This could be medication triggers. Referral made to Dr. Myna HidalgoEnnever for his input. I personally performed the services described in this documentation, which was scribed in my presence. The recorded information has been reviewed and is accurate. Collene GobbleSteven A Sharlett Lienemann, MD

## 2016-02-24 NOTE — Addendum Note (Signed)
Addended by: Lesle ChrisAUB, Ula Couvillon A on: 02/24/2016 09:25 AM   Modules accepted: Orders

## 2016-03-18 ENCOUNTER — Ambulatory Visit: Payer: Self-pay | Admitting: Hematology & Oncology

## 2016-03-18 ENCOUNTER — Other Ambulatory Visit: Payer: Self-pay

## 2016-03-18 ENCOUNTER — Ambulatory Visit: Payer: Self-pay

## 2016-03-20 ENCOUNTER — Other Ambulatory Visit (HOSPITAL_BASED_OUTPATIENT_CLINIC_OR_DEPARTMENT_OTHER): Payer: BLUE CROSS/BLUE SHIELD

## 2016-03-20 ENCOUNTER — Encounter: Payer: Self-pay | Admitting: Hematology & Oncology

## 2016-03-20 ENCOUNTER — Ambulatory Visit (HOSPITAL_BASED_OUTPATIENT_CLINIC_OR_DEPARTMENT_OTHER): Payer: BLUE CROSS/BLUE SHIELD | Admitting: Hematology & Oncology

## 2016-03-20 ENCOUNTER — Ambulatory Visit: Payer: BLUE CROSS/BLUE SHIELD

## 2016-03-20 VITALS — BP 145/81 | HR 62 | Temp 97.3°F | Resp 18 | Ht 66.0 in | Wt 209.0 lb

## 2016-03-20 DIAGNOSIS — D696 Thrombocytopenia, unspecified: Secondary | ICD-10-CM

## 2016-03-20 DIAGNOSIS — Z809 Family history of malignant neoplasm, unspecified: Secondary | ICD-10-CM

## 2016-03-20 LAB — CBC WITH DIFFERENTIAL (CANCER CENTER ONLY)
BASO#: 0 10*3/uL (ref 0.0–0.2)
BASO%: 1 % (ref 0.0–2.0)
EOS ABS: 0.2 10*3/uL (ref 0.0–0.5)
EOS%: 4.8 % (ref 0.0–7.0)
HEMATOCRIT: 42.7 % (ref 34.8–46.6)
HEMOGLOBIN: 14.7 g/dL (ref 11.6–15.9)
LYMPH#: 1.5 10*3/uL (ref 0.9–3.3)
LYMPH%: 35.5 % (ref 14.0–48.0)
MCH: 31.4 pg (ref 26.0–34.0)
MCHC: 34.4 g/dL (ref 32.0–36.0)
MCV: 91 fL (ref 81–101)
MONO#: 0.4 10*3/uL (ref 0.1–0.9)
MONO%: 8.6 % (ref 0.0–13.0)
NEUT%: 50.1 % (ref 39.6–80.0)
NEUTROS ABS: 2.1 10*3/uL (ref 1.5–6.5)
Platelets: 136 10*3/uL — ABNORMAL LOW (ref 145–400)
RBC: 4.68 10*6/uL (ref 3.70–5.32)
RDW: 13 % (ref 11.1–15.7)
WBC: 4.2 10*3/uL (ref 3.9–10.0)

## 2016-03-20 LAB — TECHNOLOGIST REVIEW CHCC SATELLITE

## 2016-03-20 LAB — CHCC SATELLITE - SMEAR

## 2016-03-20 NOTE — Progress Notes (Signed)
Referral MD  Reason for Referral: Mild thrombocytopenia   Chief Complaint  Patient presents with  . Other    New Patient  : I was told them on platelets are low.  HPI: Vanessa [name] is a very nice 65 year old white female. She has been fairly healthy. She does have some anxiety issues. She is under a lot of stress at work.  She's had no problems with bleeding or bruising.  She was found have some mild thrombocytopenia recently.  When I look back into her records, about 6 months ago, her platelet count was normal at 183,000. Back in February, her blood count was 118,000.  Most recently, a CBC was done which showed a white cell count of 4.5. Hemoglobin 14.4. And platelet count 121,000. MCV was 90.  She has had no problems with weight loss or weight gain. She's had no change in bowel or bladder habits. She is up-to-date on her mammograms and colonoscopy.  She's had one child. She had no problems with the pregnancy.   She said that her daughter had low platelets with her pregnancy. There apparently is a brother who had low platelets.  There is no history of hepatitis or HIV exposure. She's never had a transfusion.  She's had no problems with recurrent infections. She's had no fever. She's had no rashes. She's had no joint problems.  His been no change in medications.  She's had no tingling in the hands or feet.  Overall, her performance status is ECOG 0.              Past Medical History  Diagnosis Date  . Depression   . Anxiety   . Hypertension   . Arthritis   :  Past Surgical History  Procedure Laterality Date  . Fracture surgery    :   Current outpatient prescriptions:  .  albuterol (PROVENTIL HFA;VENTOLIN HFA) 108 (90 Base) MCG/ACT inhaler, Inhale 2 puffs into the lungs every 4 (four) hours as needed for wheezing (cough, shortness of breath or wheezing.)., Disp: 1 Inhaler, Rfl: 1 .  ALPRAZolam (XANAX) 1 MG tablet, Take 1 tablet (1 mg total) by mouth at  bedtime., Disp: 30 tablet, Rfl: 5 .  atorvastatin (LIPITOR) 40 MG tablet, Take 1 tablet (40 mg total) by mouth daily., Disp: 90 tablet, Rfl: 3 .  escitalopram (LEXAPRO) 10 MG tablet, Take 1 tablet (10 mg total) by mouth daily., Disp: 30 tablet, Rfl: 11 .  HYDROcodone-acetaminophen (NORCO) 5-325 MG tablet, One half tablet every 6 hours as needed for pain, Disp: 20 tablet, Rfl: 0 .  lisinopril (PRINIVIL,ZESTRIL) 20 MG tablet, Take 1 tablet a day for blood pressure control, Disp: 30 tablet, Rfl: 11:  :  No Known Allergies:  Family History  Problem Relation Age of Onset  . Cancer Mother   :  Social History   Social History  . Marital Status: Married    Spouse Name: N/A  . Number of Children: N/A  . Years of Education: N/A   Occupational History  . Not on file.   Social History Main Topics  . Smoking status: Never Smoker   . Smokeless tobacco: Never Used  . Alcohol Use: No  . Drug Use: No  . Sexual Activity: Yes    Birth Control/ Protection: None   Other Topics Concern  . Not on file   Social History Narrative  :  Pertinent items are noted in HPI.  Exam: @ Well-developed and well-nourished white female in no obvious distress. Vital  signs are temperature of 97.3. Pulse 62. Blood pressure 145/81. Weight is 209 pounds. Head and neck exam shows no ocular or oral lesions. There are no palpable cervical or supraclavicular lymph nodes. Thyroid is nonpalpable. Lungs are clear bilaterally. Cardiac exam regular rate and rhythm with no murmurs, rubs or bruits. Abdomen is soft. She is good bowel sounds. There is no fluid wave. There is no palpable liver or spleen tip. Back exam shows no tenderness over the spine, ribs or hips. Extremities shows no clubbing, cyanosis or edema. Skin exam shows no rashes, ecchymoses or petechia. Neurological exam shows no focal neurological deficits.    Recent Labs  03/20/16 1046  WBC 4.2  HGB 14.7  HCT 42.7  PLT 136 Platelet count  consistent in citrate*   No results for input(s): NA, K, CL, CO2, GLUCOSE, BUN, CREATININE, CALCIUM in the last 72 hours.  Blood smear review:  Normochromic and was a population of red blood cells. There is no nucleated red blood cells. I see no teardrop cells. There's no rouleau formation. I see no schistocytes or spherocytes. White cells per normal morphology maturation. There is no immature myeloid or lymphoid forms. She has no atypical lymphocytes. Platelets are minimally decreased in number. Platelets are well granulated.  Pathology: None     Assessment and Plan:  Vanessa [name] is a very charming 65 year old white female. She has minimal thrombocytopenia. One might think that this could be medication related. I suppose that she could have immune thrombocytopenia. If so, this is minimally affecting her.  I don't see anything on her physical exam or on her blood smear that would suggest an underlying bone marrow disorder. I do not think that she has myelodysplasia.  There is no history to suggest hepatitis or HIV associated thrombocytopenia.  I'll see any that suggest a collagen vascular disease such as lupus or rheumatoid arthritis.  I really don't think she is a bone marrow test.  From my point of view, I suspect that her platelet count always will be living alone side. Again, this might be medication related but I would not change medicines that she is on.  I really don't think we have to see her back in the office. We really are not quite to contribute much to her overall medical care.  I spent about 45 minutes with her. Again, she is very nice. I reviewed the labs with her. I reassured her.

## 2016-03-21 LAB — HEPATITIS PANEL, ACUTE
HEP B C IGM: NEGATIVE
HEP B S AG: NEGATIVE
Hep A Ab, IgM: NEGATIVE

## 2016-03-21 LAB — HIV ANTIBODY (ROUTINE TESTING W REFLEX): HIV SCREEN 4TH GENERATION: NONREACTIVE

## 2016-03-21 LAB — RHEUMATOID FACTOR: RA Latex Turbid.: <10 IU/mL (ref 0.0–13.9)

## 2016-03-23 LAB — LUPUS ANTICOAGULANT PANEL
PTT-LA: 34.9 s (ref 0.0–43.6)
dRVVT: 39.8 s (ref 0.0–44.0)

## 2016-03-23 LAB — CARDIOLIPIN ANTIBODIES, IGG, IGM, IGA
Anticardiolipin Ab,IgA,Qn: <9 APL U/mL (ref 0–11)
Anticardiolipin Ab,IgG,Qn: <9 GPL U/mL (ref 0–14)

## 2016-03-23 LAB — ANTINUCLEAR ANTIBODIES, IFA: ANTINUCLEAR ANTIBODIES, IFA: NEGATIVE

## 2016-04-08 ENCOUNTER — Telehealth: Payer: Self-pay

## 2016-04-08 NOTE — Telephone Encounter (Signed)
Pt is having trouble with her escitalopram (LEXAPRO) 10 MG tablet [[id number]][[id number]]. She feels it is not doing the job-she is feeling more anxious. She would like to be switched back to Celexa. I let her know she may need to come in for OV. Please advise at  [phone]

## 2016-04-08 NOTE — Telephone Encounter (Signed)
I would like her to come back in and discuss treatment options before we make a change.

## 2016-04-13 NOTE — Telephone Encounter (Signed)
Spoke with pt, she will give it more time but if it gets worse she will come in.

## 2016-07-28 ENCOUNTER — Other Ambulatory Visit: Payer: Self-pay | Admitting: Emergency Medicine

## 2016-07-28 DIAGNOSIS — G4700 Insomnia, unspecified: Secondary | ICD-10-CM

## 2016-07-28 NOTE — Telephone Encounter (Signed)
It looks like she only receives controlled substances from you. Do you want Vanessa [name] to call in refills?  She is not currently receiving opioids per [place].  [name], MS, PA-C 10:30 PM, 07/28/2016

## 2016-07-29 NOTE — Telephone Encounter (Signed)
Yes please send this in. Thank you Casimiro NeedleMichael

## 2016-07-30 NOTE — Telephone Encounter (Signed)
Delaney Meigsamara or Christianne BorrowBarabara,  Can you please call this in?  Deliah BostonMichael Dandrae Kustra, MS, PA-C 2:58 PM, 07/30/2016

## 2016-08-05 ENCOUNTER — Other Ambulatory Visit: Payer: Self-pay

## 2016-08-05 DIAGNOSIS — G4700 Insomnia, unspecified: Secondary | ICD-10-CM

## 2016-08-05 MED ORDER — ALPRAZOLAM 1 MG PO TABS: 1.0000 mg | ORAL_TABLET | Freq: Every day | ORAL | 5 refills | Status: DC

## 2016-08-05 NOTE — Telephone Encounter (Signed)
Pharm reqs Rf of alprazolam. Pended.

## 2017-02-21 ENCOUNTER — Other Ambulatory Visit: Payer: Self-pay | Admitting: Emergency Medicine

## 2017-02-21 DIAGNOSIS — F329 Major depressive disorder, single episode, unspecified: Secondary | ICD-10-CM

## 2017-02-21 DIAGNOSIS — I10 Essential (primary) hypertension: Secondary | ICD-10-CM

## 2017-02-21 DIAGNOSIS — F32A Depression, unspecified: Secondary | ICD-10-CM

## 2017-03-23 ENCOUNTER — Other Ambulatory Visit: Payer: Self-pay | Admitting: Emergency Medicine

## 2017-03-23 DIAGNOSIS — E785 Hyperlipidemia, unspecified: Secondary | ICD-10-CM

## 2017-03-26 ENCOUNTER — Other Ambulatory Visit: Payer: Self-pay | Admitting: Family Medicine

## 2017-03-26 DIAGNOSIS — I10 Essential (primary) hypertension: Secondary | ICD-10-CM

## 2017-03-26 DIAGNOSIS — F32A Depression, unspecified: Secondary | ICD-10-CM

## 2017-03-26 DIAGNOSIS — F329 Major depressive disorder, single episode, unspecified: Secondary | ICD-10-CM

## 2017-04-28 ENCOUNTER — Ambulatory Visit: Payer: BLUE CROSS/BLUE SHIELD | Admitting: Family Medicine

## 2018-05-10 ENCOUNTER — Encounter: Payer: Self-pay | Admitting: Family Medicine

## 2022-03-14 DIAGNOSIS — U071 COVID-19: Secondary | ICD-10-CM

## 2022-03-14 HISTORY — DX: COVID-19: U07.1

## 2022-08-14 DIAGNOSIS — I619 Nontraumatic intracerebral hemorrhage, unspecified: Secondary | ICD-10-CM

## 2022-08-14 HISTORY — DX: Nontraumatic intracerebral hemorrhage, unspecified: I61.9

## 2022-08-25 ENCOUNTER — Inpatient Hospital Stay (HOSPITAL_COMMUNITY): Payer: Medicare Other

## 2022-08-25 ENCOUNTER — Inpatient Hospital Stay (HOSPITAL_COMMUNITY)
Admission: EM | Admit: 2022-08-25 | Discharge: 2022-09-03 | DRG: 492 | Disposition: A | Discharge status: Rehabilitation Facility | Payer: Medicare Other | Attending: General Surgery | Admitting: General Surgery

## 2022-08-25 ENCOUNTER — Other Ambulatory Visit: Payer: Self-pay

## 2022-08-25 ENCOUNTER — Emergency Department (HOSPITAL_COMMUNITY): Payer: Medicare Other

## 2022-08-25 ENCOUNTER — Encounter (HOSPITAL_COMMUNITY): Payer: Self-pay

## 2022-08-25 DIAGNOSIS — Z79899 Other long term (current) drug therapy: Secondary | ICD-10-CM

## 2022-08-25 DIAGNOSIS — F419 Anxiety disorder, unspecified: Secondary | ICD-10-CM | POA: Diagnosis present

## 2022-08-25 DIAGNOSIS — W109XXA Fall (on) (from) unspecified stairs and steps, initial encounter: Secondary | ICD-10-CM | POA: Diagnosis present

## 2022-08-25 DIAGNOSIS — R402142 Coma scale, eyes open, spontaneous, at arrival to emergency department: Secondary | ICD-10-CM | POA: Diagnosis present

## 2022-08-25 DIAGNOSIS — R251 Tremor, unspecified: Secondary | ICD-10-CM | POA: Diagnosis present

## 2022-08-25 DIAGNOSIS — F05 Delirium due to known physiological condition: Secondary | ICD-10-CM | POA: Diagnosis not present

## 2022-08-25 DIAGNOSIS — S42201S Unspecified fracture of upper end of right humerus, sequela: Secondary | ICD-10-CM | POA: Diagnosis not present

## 2022-08-25 DIAGNOSIS — S069X3S Unspecified intracranial injury with loss of consciousness of 1 hour to 5 hours 59 minutes, sequela: Secondary | ICD-10-CM | POA: Diagnosis not present

## 2022-08-25 DIAGNOSIS — S52252A Displaced comminuted fracture of shaft of ulna, left arm, initial encounter for closed fracture: Secondary | ICD-10-CM | POA: Diagnosis present

## 2022-08-25 DIAGNOSIS — S0012XA Contusion of left eyelid and periocular area, initial encounter: Secondary | ICD-10-CM | POA: Diagnosis present

## 2022-08-25 DIAGNOSIS — S52602A Unspecified fracture of lower end of left ulna, initial encounter for closed fracture: Secondary | ICD-10-CM | POA: Diagnosis present

## 2022-08-25 DIAGNOSIS — S066XAA Traumatic subarachnoid hemorrhage with loss of consciousness status unknown, initial encounter: Secondary | ICD-10-CM | POA: Diagnosis present

## 2022-08-25 DIAGNOSIS — S069X1S Unspecified intracranial injury with loss of consciousness of 30 minutes or less, sequela: Secondary | ICD-10-CM | POA: Diagnosis not present

## 2022-08-25 DIAGNOSIS — Z8616 Personal history of COVID-19: Secondary | ICD-10-CM

## 2022-08-25 DIAGNOSIS — E876 Hypokalemia: Secondary | ICD-10-CM | POA: Diagnosis present

## 2022-08-25 DIAGNOSIS — S42401A Unspecified fracture of lower end of right humerus, initial encounter for closed fracture: Secondary | ICD-10-CM | POA: Diagnosis present

## 2022-08-25 DIAGNOSIS — R7989 Other specified abnormal findings of blood chemistry: Secondary | ICD-10-CM | POA: Diagnosis not present

## 2022-08-25 DIAGNOSIS — G4733 Obstructive sleep apnea (adult) (pediatric): Secondary | ICD-10-CM | POA: Diagnosis present

## 2022-08-25 DIAGNOSIS — S52502A Unspecified fracture of the lower end of left radius, initial encounter for closed fracture: Secondary | ICD-10-CM | POA: Diagnosis present

## 2022-08-25 DIAGNOSIS — R000 Tachycardia, unspecified: Secondary | ICD-10-CM | POA: Diagnosis not present

## 2022-08-25 DIAGNOSIS — S069X3D Unspecified intracranial injury with loss of consciousness of 1 hour to 5 hours 59 minutes, subsequent encounter: Secondary | ICD-10-CM | POA: Diagnosis not present

## 2022-08-25 DIAGNOSIS — D709 Neutropenia, unspecified: Secondary | ICD-10-CM | POA: Diagnosis present

## 2022-08-25 DIAGNOSIS — S42241A 4-part fracture of surgical neck of right humerus, initial encounter for closed fracture: Secondary | ICD-10-CM | POA: Diagnosis present

## 2022-08-25 DIAGNOSIS — R402362 Coma scale, best motor response, obeys commands, at arrival to emergency department: Secondary | ICD-10-CM | POA: Diagnosis present

## 2022-08-25 DIAGNOSIS — D696 Thrombocytopenia, unspecified: Secondary | ICD-10-CM | POA: Diagnosis present

## 2022-08-25 DIAGNOSIS — I609 Nontraumatic subarachnoid hemorrhage, unspecified: Principal | ICD-10-CM

## 2022-08-25 DIAGNOSIS — Z79890 Hormone replacement therapy (postmenopausal): Secondary | ICD-10-CM

## 2022-08-25 DIAGNOSIS — Z888 Allergy status to other drugs, medicaments and biological substances status: Secondary | ICD-10-CM

## 2022-08-25 DIAGNOSIS — S52121A Displaced fracture of head of right radius, initial encounter for closed fracture: Secondary | ICD-10-CM | POA: Diagnosis present

## 2022-08-25 DIAGNOSIS — I10 Essential (primary) hypertension: Secondary | ICD-10-CM | POA: Diagnosis present

## 2022-08-25 DIAGNOSIS — D62 Acute posthemorrhagic anemia: Secondary | ICD-10-CM | POA: Diagnosis not present

## 2022-08-25 DIAGNOSIS — E039 Hypothyroidism, unspecified: Secondary | ICD-10-CM | POA: Diagnosis present

## 2022-08-25 DIAGNOSIS — M898X9 Other specified disorders of bone, unspecified site: Secondary | ICD-10-CM | POA: Diagnosis present

## 2022-08-25 DIAGNOSIS — E785 Hyperlipidemia, unspecified: Secondary | ICD-10-CM | POA: Diagnosis present

## 2022-08-25 DIAGNOSIS — S42401S Unspecified fracture of lower end of right humerus, sequela: Secondary | ICD-10-CM | POA: Diagnosis not present

## 2022-08-25 DIAGNOSIS — S52502S Unspecified fracture of the lower end of left radius, sequela: Secondary | ICD-10-CM | POA: Diagnosis not present

## 2022-08-25 DIAGNOSIS — T1490XA Injury, unspecified, initial encounter: Secondary | ICD-10-CM | POA: Diagnosis present

## 2022-08-25 DIAGNOSIS — F32A Depression, unspecified: Secondary | ICD-10-CM | POA: Diagnosis present

## 2022-08-25 DIAGNOSIS — K219 Gastro-esophageal reflux disease without esophagitis: Secondary | ICD-10-CM | POA: Diagnosis present

## 2022-08-25 DIAGNOSIS — W108XXA Fall (on) (from) other stairs and steps, initial encounter: Secondary | ICD-10-CM

## 2022-08-25 DIAGNOSIS — S065XAA Traumatic subdural hemorrhage with loss of consciousness status unknown, initial encounter: Secondary | ICD-10-CM | POA: Diagnosis present

## 2022-08-25 DIAGNOSIS — Z20822 Contact with and (suspected) exposure to covid-19: Secondary | ICD-10-CM | POA: Diagnosis present

## 2022-08-25 DIAGNOSIS — R402242 Coma scale, best verbal response, confused conversation, at arrival to emergency department: Secondary | ICD-10-CM | POA: Diagnosis present

## 2022-08-25 DIAGNOSIS — S42201A Unspecified fracture of upper end of right humerus, initial encounter for closed fracture: Secondary | ICD-10-CM

## 2022-08-25 DIAGNOSIS — S52602S Unspecified fracture of lower end of left ulna, sequela: Secondary | ICD-10-CM | POA: Diagnosis not present

## 2022-08-25 DIAGNOSIS — S0081XA Abrasion of other part of head, initial encounter: Secondary | ICD-10-CM | POA: Diagnosis present

## 2022-08-25 HISTORY — DX: Obstructive sleep apnea (adult) (pediatric): G47.33

## 2022-08-25 HISTORY — DX: Restless legs syndrome: G25.81

## 2022-08-25 HISTORY — DX: Cystocele, unspecified: N81.10

## 2022-08-25 HISTORY — DX: Thrombocytopenia, unspecified: D69.6

## 2022-08-25 HISTORY — DX: Tremor, unspecified: R25.1

## 2022-08-25 HISTORY — DX: Myalgia, unspecified site: M79.10

## 2022-08-25 LAB — CBC
HCT: 43.3 % (ref 36.0–46.0)
Hemoglobin: 14.5 g/dL (ref 12.0–15.0)
MCH: 31.9 pg (ref 26.0–34.0)
MCHC: 33.5 g/dL (ref 30.0–36.0)
MCV: 95.4 fL (ref 80.0–100.0)
Platelets: 163 10*3/uL (ref 150–400)
RBC: 4.54 MIL/uL (ref 3.87–5.11)
RDW: 13 % (ref 11.5–15.5)
WBC: 11.1 10*3/uL — ABNORMAL HIGH (ref 4.0–10.5)
nRBC: 0 % (ref 0.0–0.2)

## 2022-08-25 LAB — SAMPLE TO BLOOD BANK

## 2022-08-25 LAB — COMPREHENSIVE METABOLIC PANEL
ALT: 23 U/L (ref 0–44)
AST: 26 U/L (ref 15–41)
Albumin: 3.5 g/dL (ref 3.5–5.0)
Alkaline Phosphatase: 67 U/L (ref 38–126)
Anion gap: 9 (ref 5–15)
BUN: 18 mg/dL (ref 8–23)
CO2: 21 mmol/L — ABNORMAL LOW (ref 22–32)
Calcium: 9.1 mg/dL (ref 8.9–10.3)
Chloride: 110 mmol/L (ref 98–111)
Creatinine, Ser: 0.8 mg/dL (ref 0.44–1.00)
GFR, Estimated: >60 mL/min (ref >60)
Glucose, Bld: 157 mg/dL — ABNORMAL HIGH (ref 70–99)
Potassium: 4 mmol/L (ref 3.5–5.1)
Sodium: 140 mmol/L (ref 135–145)
Total Bilirubin: 0.8 mg/dL (ref 0.3–1.2)
Total Protein: 6.2 g/dL — ABNORMAL LOW (ref 6.5–8.1)

## 2022-08-25 LAB — I-STAT CHEM 8, ED
BUN: 20 mg/dL (ref 8–23)
Calcium, Ion: 1.12 mmol/L — ABNORMAL LOW (ref 1.15–1.40)
Chloride: 107 mmol/L (ref 98–111)
Creatinine, Ser: 0.7 mg/dL (ref 0.44–1.00)
Glucose, Bld: 159 mg/dL — ABNORMAL HIGH (ref 70–99)
HCT: 42 % (ref 36.0–46.0)
Hemoglobin: 14.3 g/dL (ref 12.0–15.0)
Potassium: 4 mmol/L (ref 3.5–5.1)
Sodium: 142 mmol/L (ref 135–145)
TCO2: 23 mmol/L (ref 22–32)

## 2022-08-25 LAB — PROTIME-INR
INR: 1 (ref 0.8–1.2)
Prothrombin Time: 13 seconds (ref 11.4–15.2)

## 2022-08-25 LAB — HIV ANTIBODY (ROUTINE TESTING W REFLEX): HIV Screen 4th Generation wRfx: NONREACTIVE

## 2022-08-25 LAB — LACTIC ACID, PLASMA: Lactic Acid, Venous: 2 mmol/L (ref 0.5–1.9)

## 2022-08-25 LAB — RESP PANEL BY RT-PCR (FLU A&B, COVID) ARPGX2
Influenza A by PCR: NEGATIVE
Influenza B by PCR: NEGATIVE
SARS Coronavirus 2 by RT PCR: NEGATIVE

## 2022-08-25 LAB — ETHANOL: Alcohol, Ethyl (B): <10 mg/dL (ref <10)

## 2022-08-25 MED ORDER — PENTAFLUOROPROP-TETRAFLUOROETH EX AERO: INHALATION_SPRAY | CUTANEOUS | Status: DC

## 2022-08-25 MED ORDER — METHOCARBAMOL 1000 MG/10ML IJ SOLN: 500.0000 mg | Freq: Three times a day (TID) | INTRAVENOUS | Status: DC | PRN

## 2022-08-25 MED ORDER — LEVETIRACETAM IN NACL 500 MG/100ML IV SOLN
500.0000 mg | Freq: Two times a day (BID) | INTRAVENOUS | Status: DC
  Administered 2022-08-25 – 2022-08-28 (×6): 500 mg via INTRAVENOUS
  Filled 2022-08-25 (×6): qty 100

## 2022-08-25 MED ORDER — FENTANYL CITRATE PF 50 MCG/ML IJ SOSY
50.0000 ug | PREFILLED_SYRINGE | Freq: Once | INTRAMUSCULAR | Status: AC
  Administered 2022-08-25: 50 ug via INTRAVENOUS
  Filled 2022-08-25: qty 1

## 2022-08-25 MED ORDER — LIDOCAINE HCL (PF) 1 % IJ SOLN
30.0000 mL | Freq: Once | INTRAMUSCULAR | Status: AC
  Administered 2022-08-25: 30 mL
  Filled 2022-08-25: qty 30

## 2022-08-25 MED ORDER — CHLORHEXIDINE GLUCONATE CLOTH 2 % EX PADS
6.0000 | MEDICATED_PAD | Freq: Every day | CUTANEOUS | Status: DC
  Administered 2022-08-25 – 2022-09-03 (×10): 6 via TOPICAL

## 2022-08-25 MED ORDER — HYDROMORPHONE HCL 1 MG/ML IJ SOLN
1.0000 mg | Freq: Once | INTRAMUSCULAR | Status: AC
  Administered 2022-08-25: 1 mg via INTRAVENOUS
  Filled 2022-08-25: qty 1

## 2022-08-25 MED ORDER — ACETAMINOPHEN 500 MG PO TABS
1000.0000 mg | ORAL_TABLET | Freq: Four times a day (QID) | ORAL | Status: DC
  Administered 2022-08-25 – 2022-09-03 (×31): 1000 mg via ORAL
  Filled 2022-08-25 (×35): qty 2

## 2022-08-25 MED ORDER — ONDANSETRON 4 MG PO TBDP: 4.0000 mg | ORAL_TABLET | Freq: Four times a day (QID) | ORAL | Status: DC | PRN

## 2022-08-25 MED ORDER — SODIUM CHLORIDE 0.9 % IV SOLN: INTRAVENOUS | Status: DC

## 2022-08-25 MED ORDER — HYDROMORPHONE HCL 1 MG/ML IJ SOLN
0.5000 mg | INTRAMUSCULAR | Status: DC | PRN
  Administered 2022-08-27 – 2022-08-28 (×2): 0.5 mg via INTRAVENOUS
  Filled 2022-08-25 (×2): qty 0.5

## 2022-08-25 MED ORDER — METHOCARBAMOL 500 MG PO TABS: 500.0000 mg | ORAL_TABLET | Freq: Three times a day (TID) | ORAL | Status: DC | PRN

## 2022-08-25 MED ORDER — PROPOFOL 10 MG/ML IV BOLUS
1.0000 mg/kg | Freq: Once | INTRAVENOUS | Status: AC
  Administered 2022-08-25: 94.8 mg via INTRAVENOUS
  Filled 2022-08-25: qty 20

## 2022-08-25 MED ORDER — ALBUTEROL SULFATE (2.5 MG/3ML) 0.083% IN NEBU: 3.0000 mL | INHALATION_SOLUTION | RESPIRATORY_TRACT | Status: DC | PRN

## 2022-08-25 MED ORDER — METOPROLOL TARTRATE 5 MG/5ML IV SOLN
5.0000 mg | Freq: Four times a day (QID) | INTRAVENOUS | Status: DC | PRN
  Filled 2022-08-25: qty 5

## 2022-08-25 MED ORDER — ONDANSETRON HCL 4 MG/2ML IJ SOLN: 4.0000 mg | Freq: Four times a day (QID) | INTRAMUSCULAR | Status: DC | PRN

## 2022-08-25 MED ORDER — BISACODYL 10 MG RE SUPP: 10.0000 mg | Freq: Every day | RECTAL | Status: DC | PRN

## 2022-08-25 MED ORDER — OXYCODONE HCL 5 MG PO TABS
10.0000 mg | ORAL_TABLET | ORAL | Status: DC | PRN
  Administered 2022-08-25 – 2022-09-02 (×9): 10 mg via ORAL
  Filled 2022-08-25 (×10): qty 2

## 2022-08-25 MED ORDER — DOCUSATE SODIUM 100 MG PO CAPS
100.0000 mg | ORAL_CAPSULE | Freq: Two times a day (BID) | ORAL | Status: DC
  Administered 2022-08-25 – 2022-09-03 (×18): 100 mg via ORAL
  Filled 2022-08-25 (×17): qty 1

## 2022-08-25 MED ORDER — MIDAZOLAM HCL 2 MG/2ML IJ SOLN
2.0000 mg | Freq: Once | INTRAMUSCULAR | Status: AC
  Administered 2022-08-25: 2 mg via INTRAVENOUS
  Filled 2022-08-25: qty 2

## 2022-08-25 MED ORDER — ESCITALOPRAM OXALATE 10 MG PO TABS
10.0000 mg | ORAL_TABLET | Freq: Every day | ORAL | Status: DC
  Administered 2022-08-25 – 2022-08-26 (×2): 10 mg via ORAL
  Filled 2022-08-25 (×2): qty 1

## 2022-08-25 MED ORDER — HYDRALAZINE HCL 20 MG/ML IJ SOLN
10.0000 mg | INTRAMUSCULAR | Status: DC | PRN
  Administered 2022-08-27: 10 mg via INTRAVENOUS
  Filled 2022-08-25: qty 1

## 2022-08-25 MED ORDER — OXYCODONE HCL 5 MG PO TABS
5.0000 mg | ORAL_TABLET | ORAL | Status: DC | PRN
  Administered 2022-08-28 – 2022-09-03 (×9): 5 mg via ORAL
  Filled 2022-08-25 (×9): qty 1

## 2022-08-25 MED ORDER — IOHEXOL 300 MG/ML  SOLN
100.0000 mL | Freq: Once | INTRAMUSCULAR | Status: AC | PRN
  Administered 2022-08-25: 100 mL via INTRAVENOUS

## 2022-08-25 MED ORDER — FENTANYL CITRATE PF 50 MCG/ML IJ SOSY
100.0000 ug | PREFILLED_SYRINGE | Freq: Once | INTRAMUSCULAR | Status: AC
  Administered 2022-08-25 (×2): 50 ug via INTRAVENOUS
  Filled 2022-08-25: qty 2

## 2022-08-25 NOTE — ED Triage Notes (Signed)
EMS reports patient fell down 12 steps onto carpeted floor.  With injuries to left wrist and right elbow, also hematoma to right orbit with abrasions to right face.

## 2022-08-25 NOTE — Progress Notes (Signed)
Orthopedic Tech Progress Note Patient Details:  Vanessa [name] [birth year] [id number]  PA called requesting for [place] to come an apply splints to patient after he did a reduction of the left wrist and right elbow   Ortho Devices Type of Ortho Device: Long arm splint, Sugartong splint, Cotton web roll, Ace wrap, Finger trap Finger Trap Weight: 5 (LUE) Ortho Device/Splint Location: BUE Ortho Device/Splint Interventions: Ordered, Application, Adjustment   Post Interventions Patient Tolerated: Well Instructions Provided: Care of device  Donald Pore 08/25/2022, 11:41 AM

## 2022-08-25 NOTE — ED Notes (Signed)
..  Trauma Response Nurse Documentation   Vanessa [name] is a 71 y.o. female arriving to [facility] ED via EMS  On No antithrombotic. Trauma was activated as a Level 2 by charge nuse based on the following trauma criteria GCS 10-14 associated with trauma or AVPU < A. Trauma team at the bedside on patient arrival.   Patient cleared for CT by Dr. Pilar Plate. Pt transported to CT with trauma response nurse present to monitor. TRN remained with the patient on CCM throughout their absence from the department for clinical observation d/t decreased LOC, confusion and spinal immobilization   GCS 14.  History   Past Medical History:  Diagnosis Date   Anxiety    Arthritis    Depression    Hypertension      Past Surgical History:  Procedure Laterality Date   FRACTURE SURGERY         Initial Focused Assessment (If applicable, or please see trauma documentation):  Abrasion R forehead/R eye/r cheek abrasion/ tenderness R elbow/L wrist CT's Completed:   CT Head, CT Maxillofacial, CT C-Spine, CT Chest w/ contrast, and CT abdomen/pelvis w/ contrast   Interventions:  -initial trauma assessment -transport to/from CT -family communication -pain med order obtained from EDP  Plan for disposition:  Admission to Progressive Care   Consults completed:  Neurosurgeon at  Ortho at 0634 TMD 0635 arrival [phone]  Event Summary Pt arrived via [facility] from home after being found by husband at the bottom of stairway, @ 13 carpet steps. GCS 14, pt unsure of how she fell or why, abrasion noted to R side of face. Repetitive questioning.  VSS. PEARL3, swelling noted to R shoulder, R elbow, L hand. Rings removed and placed in cup at bedside.  Pt transported to CT on CCM with continual neuro reassessment Spinal immobilization maintained during procedure, upon return husband at bedside, POC updated with family. NS and TMD consulted for ICH/polytrauma.     Bedside handoff with ED RN Vanessa [name].    Vanessa [name]  Dee  Trauma Response RN  Please call TRN at [phone] for further assistance.

## 2022-08-25 NOTE — Consult Note (Signed)
Orthopaedic Trauma Service (OTS) Consult   Patient ID: Vanessa [name] MRN: [id number] DOB/AGE: January 12, 1951 71 y.o.   Reason for Consult: Fall with multiple fractures Referring Physician: Gerlene Fee, MD (emergency room physician)   HPI: Vanessa [name] is an 71 y.o. RHD female who sustained a fall early this morning down approximately 12 stairs.  Patient was found by her husband and brought the [facility] for evaluation.  She was found to have numerous injuries including a subarachnoid hemorrhage as well as multiple fractures to her bilateral upper extremities.  Orthopedics was consulted for her right proximal humerus fracture, right elbow fracture dislocation and left distal radius fracture.  Patient seen and evaluated in the trauma ICU.  She complains of pain in her right elbow as well as her left distal radius.  She denies any numbness or tingling in her upper extremities.  Denies any injuries to her lower extremities.  She really does not recall the events leading up to her fall.  Patient works as a Education administrator.  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  She lives with her husband.  Patient states that she is overdue for a bone density scan.  These had been ordered by her OB here in town.  She is not on any medications for osteoporosis.  Does not take any vitamin D per her report.  Remote history of right ankle fracture fixed by one of the surgeons at [facility] orthopedics  Past Medical History:  Diagnosis Date   Anxiety    Arthritis    Depression    Hypertension     Past Surgical History:  Procedure Laterality Date   FRACTURE SURGERY      Family History  Problem Relation Age of Onset   Cancer Mother     Social History:  reports that she has never smoked. She has never used smokeless tobacco. She reports that she does not drink alcohol and does not use drugs.  Allergies:  Allergies  Allergen Reactions   Ace Inhibitors Cough     Medications: I have reviewed the patient's current medications. Current Meds  Medication Sig   atorvastatin (LIPITOR) 20 MG tablet Take 20 mg by mouth daily.   DULoxetine (CYMBALTA) 60 MG capsule Take 60 mg by mouth daily.   famotidine (PEPCID) 40 MG tablet Take 40 mg by mouth daily.   gabapentin (NEURONTIN) 300 MG capsule Take 600 mg by mouth at bedtime.   levothyroxine (SYNTHROID) 50 MCG tablet Take 50 mcg by mouth daily.   losartan (COZAAR) 100 MG tablet Take 100 mg by mouth daily.   OVER THE COUNTER MEDICATION Apply 1 Application topically as needed (For back pain).   Polyethyl Glycol-Propyl Glycol (SYSTANE OP) Place 1-2 drops into both eyes daily as needed (Dry eyes).   propranolol (INDERAL) 10 MG tablet Take 10 mg by mouth 2 (two) times daily.   zaleplon (SONATA) 10 MG capsule Take 10 mg by mouth at bedtime.     Results for orders placed or performed during the hospital encounter of 08/25/22 (from the past 48 hour(s))  Comprehensive metabolic panel     Status: Abnormal   Collection Time: 08/25/22  5:06 AM  Result Value Ref Range   Sodium 140 135 - 145 mmol/L   Potassium 4.0 3.5 - 5.1 mmol/L   Chloride 110 98 - 111 mmol/L   CO2 21 (L) 22 - 32 mmol/L   Glucose, Bld 157 (H) 70 - 99  mg/dL    Comment: Glucose reference range applies only to samples taken after fasting for at least 8 hours.   BUN 18 8 - 23 mg/dL   Creatinine, Ser 1.300.80 0.44 - 1.00 mg/dL   Calcium 9.1 8.9 - 86.510.3 mg/dL   Total Protein 6.2 (L) 6.5 - 8.1 g/dL   Albumin 3.5 3.5 - 5.0 g/dL   AST 26 15 - 41 U/L   ALT 23 0 - 44 U/L   Alkaline Phosphatase 67 38 - 126 U/L   Total Bilirubin 0.8 0.3 - 1.2 mg/dL   GFR, Estimated >78>60 >46>60 mL/min    Comment: (NOTE) Calculated using the CKD-EPI Creatinine Equation (2021)    Anion gap 9 5 - 15    Comment: Performed at [facility], [address] [place], [place] [address]  CBC     Status: Abnormal   Collection Time: 08/25/22  5:06 AM  Result Value Ref Range    WBC 11.1 (H) 4.0 - 10.5 K/uL   RBC 4.54 3.87 - 5.11 MIL/uL   Hemoglobin 14.5 12.0 - 15.0 g/dL   HCT 28.443.3 13.236.0 - 44.046.0 %   MCV 95.4 80.0 - 100.0 fL   MCH 31.9 26.0 - 34.0 pg   MCHC 33.5 30.0 - 36.0 g/dL   RDW 10.213.0 72.511.5 - 36.615.5 %   Platelets 163 150 - 400 K/uL   nRBC 0.0 0.0 - 0.2 %    Comment: Performed at [facility], [address] [place], [place] [address]  Ethanol     Status: None   Collection Time: 08/25/22  5:06 AM  Result Value Ref Range   Alcohol, Ethyl (B) <10 <10 mg/dL    Comment: (NOTE) Lowest detectable limit for serum alcohol is 10 mg/dL.  For medical purposes only. Performed at [facility], [address] [place], [place] [address]   Lactic acid, plasma     Status: Abnormal   Collection Time: 08/25/22  5:06 AM  Result Value Ref Range   Lactic Acid, Venous 2.0 (HH) 0.5 - 1.9 mmol/L    Comment: CRITICAL RESULT CALLED TO, READ BACK BY AND VERIFIED WITH Nani SkillernJ. ONEAL, RN, [phone] 08/25/22, Mliss SaxA. RAMSEY Performed at [facility], [address] [place], [place] [address]   Protime-INR     Status: None   Collection Time: 08/25/22  5:06 AM  Result Value Ref Range   Prothrombin Time 13.0 11.4 - 15.2 seconds   INR 1.0 0.8 - 1.2    Comment: (NOTE) INR goal varies based on device and disease states. Performed at [facility], [address] [place], [place] [address]   Sample to Blood Bank     Status: None   Collection Time: 08/25/22  5:06 AM  Result Value Ref Range   Blood Bank Specimen SAMPLE AVAILABLE FOR TESTING    Sample Expiration      08/26/2022,2359 Performed at [facility], [address] [place], [place] [address]   I-Stat Chem 8, ED     Status: Abnormal   Collection Time: 08/25/22  5:08 AM  Result Value Ref Range   Sodium 142 135 - 145 mmol/L   Potassium 4.0 3.5 - 5.1 mmol/L   Chloride 107 98 - 111 mmol/L   BUN 20 8 - 23 mg/dL   Creatinine, Ser 6.060.70 0.44 - 1.00 mg/dL   Glucose, Bld 301159 (H) 70 - 99 mg/dL    Comment: Glucose  reference range applies  only to samples taken after fasting for at least 8 hours.   Calcium, Ion 1.12 (L) 1.15 - 1.40 mmol/L   TCO2 23 22 - 32 mmol/L   Hemoglobin 14.3 12.0 - 15.0 g/dL   HCT 08.6 57.8 - 46.9 %  Resp Panel by RT-PCR (Flu A&B, Covid) Anterior Nasal Swab     Status: None   Collection Time: 08/25/22  6:45 AM   Specimen: Anterior Nasal Swab  Result Value Ref Range   SARS Coronavirus 2 by RT PCR NEGATIVE NEGATIVE    Comment: (NOTE) SARS-CoV-2 target nucleic acids are NOT DETECTED.  The SARS-CoV-2 RNA is generally detectable in upper respiratory specimens during the acute phase of infection. The lowest concentration of SARS-CoV-2 viral copies this assay can detect is 138 copies/mL. A negative result does not preclude SARS-Cov-2 infection and should not be used as the sole basis for treatment or other patient management decisions. A negative result may occur with  improper specimen collection/handling, submission of specimen other than nasopharyngeal swab, presence of viral mutation(s) within the areas targeted by this assay, and inadequate number of viral copies(<138 copies/mL). A negative result must be combined with clinical observations, patient history, and epidemiological information. The expected result is Negative.  Fact Sheet for Patients:  BloggerCourse.com  Fact Sheet for Healthcare Providers:  SeriousBroker.it  This test is no t yet approved or cleared by the [place] FDA and  has been authorized for detection and/or diagnosis of SARS-CoV-2 by FDA under an Emergency Use Authorization (EUA). This EUA will remain  in effect (meaning this test can be used) for the duration of the COVID-19 declaration under Section 564(b)(1) of the Act, 21 U.S.C.section 360bbb-3(b)(1), unless the authorization is terminated  or revoked sooner.       Influenza A by PCR NEGATIVE NEGATIVE   Influenza B by PCR NEGATIVE  NEGATIVE    Comment: (NOTE) The Xpert Xpress SARS-CoV-2/FLU/RSV plus assay is intended as an aid in the diagnosis of influenza from Nasopharyngeal swab specimens and should not be used as a sole basis for treatment. Nasal washings and aspirates are unacceptable for Xpert Xpress SARS-CoV-2/FLU/RSV testing.  Fact Sheet for Patients: BloggerCourse.com  Fact Sheet for Healthcare Providers: SeriousBroker.it  This test is not yet approved or cleared by the [place] FDA and has been authorized for detection and/or diagnosis of SARS-CoV-2 by FDA under an Emergency Use Authorization (EUA). This EUA will remain in effect (meaning this test can be used) for the duration of the COVID-19 declaration under Section 564(b)(1) of the Act, 21 U.S.C. section 360bbb-3(b)(1), unless the authorization is terminated or revoked.  Performed at [facility], [address], [place], [place] [address]     DG Wrist 2 Views Left  Result Date: 08/25/2022 CLINICAL DATA:  Trauma EXAM: LEFT WRIST - 2 VIEW COMPARISON:  Study done earlier today FINDINGS: Severely comminuted fracture is seen in the distal left radius. There is lateral and posterior displacement of distal fracture fragment along with the carpals in relation to the shaft of left radius. Fracture is extending to the articular surface. There is a comminuted fracture in the distal shaft of ulna. There is dorsal displacement of distal major fracture fragment. There is small calcific density adjacent to the distal ulna seen on the AP view suggesting possible recent avulsion. Overall, no significant interval changes are noted in comparison with the previous study. IMPRESSION: Comminuted displaced fractures are seen in distal radius and ulna with no significant interval change. Electronically  Signed   By: Elmer Picker M.D.   On: 08/25/2022 10:25   DG Elbow 2 Views Right  Result Date:  08/25/2022 CLINICAL DATA:  Status post reduction. EXAM: RIGHT ELBOW - 2 VIEW COMPARISON:  Right forearm and right humerus radiographs 08/25/2022; FINDINGS: Persistent posterior dislocation of the radius ulna with respect the distal humerus. On limited views there again fracture distal lateral humerus centered within the lateral epicondyle. Mildly comminuted and mildly displaced proximal radial fracture. Tip of the coronoid process fracture. IMPRESSION: Persistent posterior dislocation of the radius with respect to the distal humerus. Electronically Signed   By: Yvonne Kendall M.D.   On: 08/25/2022 08:10   DG Forearm Right  Result Date: 08/25/2022 CLINICAL DATA:  71 year old female status post trauma. Fall down a flight of stairs. Right arm deformity. EXAM: RIGHT FOREARM - 2 VIEW COMPARISON:  Right humerus series today. FINDINGS: Posterior fracture dislocation at the right elbow. Proximal right radius appears comminuted with displacement. Proximal right ulna may remain intact. There is evidence of a lateral supracondylar fracture of the distal right humerus. Distal radius and ulna appear intact. Right wrist alignment appears maintained. Carpal bones appear intact and aligned. Metacarpals appear intact. IMPRESSION: 1. Posterior fracture dislocation at the right elbow with comminution of the proximal right radius and fracture also of the adjacent lateral supracondylar humerus. Proximal right ulna probably remains intact. 2. No fracture or dislocation identified at the right wrist. Electronically Signed   By: Genevie Ann M.D.   On: 08/25/2022 07:41   DG Humerus Right  Result Date: 08/25/2022 CLINICAL DATA:  71 year old female status post trauma. Fall down a flight of stairs. Right arm deformity. EXAM: RIGHT HUMERUS - 2+ VIEW COMPARISON:  CT Chest, Abdomen, and Pelvis today reported separately. FINDINGS: Comminuted and impacted proximal right humerus fracture. Associated mildly displaced right coracoid fracture better  demonstrated by CT. Posterior fracture dislocation superimposed at the right elbow. Proximal radius fracture is apparent. Right humerus shaft remains intact. Soft tissue hematoma and/or contusion at the lateral mid humerus and also at the elbow. IMPRESSION: 1. Posterior fracture dislocation at the right elbow with comminution of the proximal right radius. 2. Comminuted and impacted proximal right humerus fracture with right coracoid fracture better demonstrated by CT. 3. Multifocal right arm soft tissue hematoma/contusion. Electronically Signed   By: Genevie Ann M.D.   On: 08/25/2022 07:32   DG Pelvis Portable  Result Date: 08/25/2022 CLINICAL DATA:  71 year old female status post trauma. Fall down a flight of stairs. Wrist and shoulder deformities. EXAM: PORTABLE PELVIS 1-2 VIEWS COMPARISON:  CT Chest, Abdomen, and Pelvis today reported separately. FINDINGS: Portable AP view at 0518 hours. Femoral heads normally located. Pelvis appears intact. Bone mineralization is within normal limits for age. Grossly intact proximal femurs. Negative visible bowel gas. Incidental pelvic phleboliths. IMPRESSION: No acute fracture or dislocation identified about the pelvis. Electronically Signed   By: Genevie Ann M.D.   On: 08/25/2022 07:30   DG Chest Port 1 View  Result Date: 08/25/2022 CLINICAL DATA:  71 year old female status post trauma. Fall down a flight of stairs. Wrist and shoulder deformities. EXAM: PORTABLE CHEST 1 VIEW COMPARISON:  CT Chest, Abdomen, and Pelvis today reported separately. FINDINGS: Portable AP view at 0511 hours. Proximal right humerus and coracoid fracture better demonstrated by CT. Lung volumes and mediastinal contours are within normal limits. Visualized tracheal air column is within normal limits. Allowing for portable technique the lungs are clear. IMPRESSION: 1. No acute cardiopulmonary abnormality. 2.  Proximal right humerus and coracoid fractures better demonstrated by CT. Electronically Signed    By: Genevie Ann M.D.   On: 08/25/2022 07:29   DG Wrist Complete Left  Result Date: 08/25/2022 CLINICAL DATA:  71 year old female status post trauma. Fall down a flight of stairs. Wrist and shoulder deformities. EXAM: LEFT WRIST - COMPLETE 3+ VIEW COMPARISON:  None Available. FINDINGS: AP, oblique, and cross-table lateral views of the left wrist demonstrate comminuted fractures of both the distal left radius and ulna. Oblique and comminuted distal ulna shaft transverse fracture with 1/2 shaft width ulnar and 1 full shaft width dorsal displacement. Ulnar styloid fracture superimposed. Highly impacted distal left radius metadiaphysis fracture with DRU and radiocarpal joint involvement. One full shaft width dorsal displacement and over-riding. No definite superimposed carpal fracture or dislocation. Metacarpals appear intact. IMPRESSION: Comminuted and displaced fractures of both the distal left radius and ulna. Highly impacted distal radius fracture with DRU and radiocarpal joint involvement. Electronically Signed   By: Genevie Ann M.D.   On: 08/25/2022 07:28   CT CHEST ABDOMEN PELVIS W CONTRAST  Result Date: 08/25/2022 CLINICAL DATA:  71 year old female status post trauma. Fall down a flight of stairs. EXAM: CT CHEST, ABDOMEN, AND PELVIS WITH CONTRAST TECHNIQUE: Multidetector CT imaging of the chest, abdomen and pelvis was performed following the standard protocol during bolus administration of intravenous contrast. RADIATION DOSE REDUCTION: This exam was performed according to the departmental dose-optimization program which includes automated exposure control, adjustment of the mA and/or kV according to patient size and/or use of iterative reconstruction technique. CONTRAST:  170mL OMNIPAQUE IOHEXOL 300 MG/ML  SOLN COMPARISON:  CT cervical spine. FINDINGS: CT CHEST FINDINGS Cardiovascular: Mildly tortuous but intact thoracic aorta. No cardiomegaly or pericardial effusion. Mild for age atherosclerosis.  Mediastinum/Nodes: Negative. No mediastinal hematoma, lymphadenopathy. Lungs/Pleura: Major airways are patent. Normal lung volumes. Minor lung base atelectasis or scarring. No pneumothorax. No pleural effusion. No pulmonary contusion. Musculoskeletal: Comminuted and impacted fracture of the right humerus head and neck. Associated mildly displaced fracture of the right scapula coracoid (series 5, image 111). Elsewhere the visible right scapula and clavicle appear intact. Contralateral left shoulder osseous structures appear intact. No rib or thoracic vertebral fracture identified. CT ABDOMEN PELVIS FINDINGS Hepatobiliary: Liver and gallbladder appear intact with no perihepatic free fluid. Small oval nonenhancing cyst of the liver dome measures 7 mm and appears unchanged on delayed images (no follow-up imaging recommended). Pancreas: Negative. Spleen: Spleen appears intact with no perisplenic fluid. Adrenals/Urinary Tract: Intact, negative. Stomach/Bowel: Large bowel appears negative except for retained stool. Normal appendix on series 3, image 99. No dilated small bowel. No free air or free fluid. There is a 3.4 cm diverticulum of the proximal duodenum series 3, image 72. No active inflammation. Stomach and duodenum otherwise appear negative. Vascular/Lymphatic: Mild Calcified aortic atherosclerosis. Major arterial structures in the abdomen and pelvis are patent. No lymphadenopathy. Portal venous system appears grossly patent on delayed images. Reproductive: Negative. Other: No pelvic free fluid.  Incidental pelvic phleboliths. Musculoskeletal: Normal lumbar segmentation. Mild grade 1 anterolisthesis of L4 on L5 appears to be degenerative with moderate facet arthropathy at both levels. Ununited L1 left transverse process ossification center, normal variant. Lumbar vertebrae, sacrum, SI joints, pelvis, and proximal femurs appear intact. IMPRESSION: 1. Comminuted and impacted fracture of the proximal right humerus,  with associated mildly displaced fracture of the right scapula coracoid process. 2. No other acute traumatic injury identified in the chest, abdomen, or pelvis. Electronically Signed   By:  Genevie Ann M.D.   On: 08/25/2022 06:25   CT MAXILLOFACIAL WO CONTRAST  Result Date: 08/25/2022 CLINICAL DATA:  71 year old female status post trauma. Fall down a flight of stairs. EXAM: CT MAXILLOFACIAL WITHOUT CONTRAST TECHNIQUE: Multidetector CT imaging of the maxillofacial structures was performed. Multiplanar CT image reconstructions were also generated. RADIATION DOSE REDUCTION: This exam was performed according to the departmental dose-optimization program which includes automated exposure control, adjustment of the mA and/or kV according to patient size and/or use of iterative reconstruction technique. COMPARISON:  CT head and cervical spine today reported separately. FINDINGS: Osseous: Evidence of chronic left TMJ degeneration. Mandible appears intact. Bilateral maxilla, pterygoid, zygoma, and nasal bones appear intact. No acute dental finding identified. Central skull base appears intact. Orbits: Intact bilateral orbital walls. Globes and intraorbital soft tissues appears symmetric and normal. Sinuses: Tympanic cavities, Visualized paranasal sinuses and mastoids are clear. Soft tissues: Right lateral and supraorbital scalp hematoma is mild and was also visible on head CT. No superficial match that no soft tissue gas identified. Negative visible noncontrast deep soft tissue spaces of the face. Limited intracranial: Reported separately. IMPRESSION: 1. No Face fracture identified.  Left TMJ degeneration. 2. Mild right lateral, supraorbital scalp hematoma. 3. Head and Cervical spine detailed separately. Electronically Signed   By: Genevie Ann M.D.   On: 08/25/2022 06:17   CT CERVICAL SPINE WO CONTRAST  Result Date: 08/25/2022 CLINICAL DATA:  71 year old female status post trauma. Fall down a flight of stairs. EXAM: CT  CERVICAL SPINE WITHOUT CONTRAST TECHNIQUE: Multidetector CT imaging of the cervical spine was performed without intravenous contrast. Multiplanar CT image reconstructions were also generated. RADIATION DOSE REDUCTION: This exam was performed according to the departmental dose-optimization program which includes automated exposure control, adjustment of the mA and/or kV according to patient size and/or use of iterative reconstruction technique. COMPARISON:  CT head and face today.  Chest CT today. FINDINGS: Alignment: Relatively maintained cervical lordosis. Cervicothoracic junction alignment is within normal limits. Bilateral posterior element alignment is within normal limits. Skull base and vertebrae: Visualized skull base is intact. No atlanto-occipital dissociation. Congenital incomplete ossification of the posterior C1 ring. C1 and C2 otherwise appear intact and aligned. No acute osseous abnormality identified. Soft tissues and spinal canal: No prevertebral fluid or swelling. No visible canal hematoma. Negative visible noncontrast neck soft tissues. Disc levels: Advanced chronic disc and endplate degeneration at C6-C7, but mild for age elsewhere. Doubt significant spinal stenosis. There is moderate to severe bilateral C7 foraminal stenosis. Multilevel cervical facet degeneration mostly on the left side. Upper chest: Negative, and chest CT is reported separately. IMPRESSION: 1. No acute traumatic injury identified in the cervical spine. 2. 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 Electronically Signed   By: Genevie Ann M.D.   On: 08/25/2022 06:13   CT HEAD WO CONTRAST (5MM)  Addendum Date: 08/25/2022   ADDENDUM REPORT: 08/25/2022 05:58 ADDENDUM: Study discussed by telephone with Dr. Gerlene Fee on 08/25/2022 at 0557 hours. Electronically Signed   By: Genevie Ann M.D.   On: 08/25/2022 05:58   Result Date: 08/25/2022 CLINICAL DATA:  71 year old female status  post trauma. Fall down a flight of stairs. EXAM: CT HEAD WITHOUT CONTRAST TECHNIQUE: Contiguous axial images were obtained from the base of the skull through the vertex without intravenous contrast. RADIATION DOSE REDUCTION: This exam was performed according to the departmental dose-optimization program which includes automated exposure control, adjustment of the mA and/or kV according  to patient size and/or use of iterative reconstruction technique. COMPARISON:  CT face and cervical spine today reported separately. FINDINGS: Brain: Incidental dural calcification of the prepontine cistern. Basilar cisterns are normal. Small volume posterior para falcine subdural blood along the falx (series 3, image 24). There is also evidence of trace left hemisphere subdural blood on coronal image 35, 1-2 mm only. Trace bilateral subarachnoid blood over the superior convexities. No IVH or ventriculomegaly. No cerebral hemorrhagic contusion identified. Gray-white matter differentiation appears within normal limits for age. No ventriculomegaly. No cortically based acute infarct identified. No intracranial mass effect or midline shift. Vascular: Calcified atherosclerosis at the skull base. Skull: No skull fracture identified. Facial bones and cervical spine detailed separately. Sinuses/Orbits: Visualized paranasal sinuses and mastoids are clear. Other: Right anterior frontal convexity scalp hematoma best seen on coronal image 18, up to 5 mm in thickness. Underlying calvarium appears intact. No other discrete orbit or scalp soft tissue injury identified. Face CT is reported separately. Proximal right humerus fracture visible on the scout view. IMPRESSION: 1. Trace left side and para falcine Subdural Hematoma. Trace bilateral Subarachnoid Blood. No cerebral hemorrhagic contusion or intracranial mass effect. 2. Right scalp soft tissue injury with no skull fracture identified. Facial bones and cervical spine detailed separately. 3.  Proximal Right Humerus Fracture visible on the scout view. Electronically Signed: By: Odessa Fleming M.D. On: 08/25/2022 05:54    Intake/Output      09/11 0701 09/12 0700 09/12 0701 09/13 0700   I.V. (mL/kg)  39.3 (0.4)   Total Intake(mL/kg)  39.3 (0.4)   Net  +39.3           Review of Systems  Constitutional:  Negative for chills and fever.  Respiratory:  Negative for shortness of breath.   Cardiovascular:  Negative for chest pain and palpitations.  Gastrointestinal:  Negative for abdominal pain, nausea and vomiting.  Musculoskeletal:        Right elbow and left wrist pain  Neurological:  Negative for tingling and sensory change.   Blood pressure 118/70, pulse 79, temperature 98 F (36.7 C), temperature source Oral, resp. rate 16, height 5\' 6"  (1.676 m), weight 94.8 kg, SpO2 96 %. Physical Exam Vitals and nursing note reviewed.  Constitutional:      General: She is not in acute distress.    Appearance: Normal appearance. She is well-developed and well-groomed. She is not toxic-appearing.     Comments: Patient is awake but sleepy at times.  She is cooperative  HENT:     Head: Normocephalic.  Eyes:     Extraocular Movements: Extraocular movements intact.  Cardiovascular:     Rate and Rhythm: Normal rate and regular rhythm.     Heart sounds: S1 normal and S2 normal.  Pulmonary:     Effort: Pulmonary effort is normal. No respiratory distress.     Breath sounds: Normal breath sounds.  Abdominal:     General: There is no distension.     Palpations: Abdomen is soft.  Musculoskeletal:     Cervical back: Full passive range of motion without pain and normal range of motion. No spinous process tenderness or muscular tenderness.     Comments: Pelvis      no traumatic wounds or rash, no ecchymosis, stable to manual stress, nontender  Right upper extremity Sling is in place, no splint Abrasion to right elbow but stable Radial, ulnar, median nerve motor and sensory functions intact.   Axillary nerve sensory functions intact AIN and PIN  motor functions  Swelling to the shoulder and elbow noted.  Deformity to the right elbow is noted with Significant step-off at the olecranon.   + Radial pulse Brisk capillary refill  shoulder and elbow with tenderness  Forearm, wrist and hand are nontender  Left upper extremity  Marked deformity to left distal radius No open wounds Blue foam splint present Extremity is warm + Radial pulse Compartments are soft No pain with passive stretching of her digits Elbow and shoulder are unremarkable Radial, ulnar, median nerve motor and sensory functions intact.  AIN and PIN motor functions intact  Bilateral lower extremities             no open wounds or lesions, no swelling or ecchymosis   Nontender hip, knee, ankle and foot             No crepitus or gross motion noted with manipulation of B legs   No knee or ankle effusion             No pain with axial loading or logrolling of the hip. Negative Stinchfield test   Knee stable to varus/ valgus and anterior/posterior stress             No pain with manipulation of the ankle or foot             No blocks to motion noted  Sens DPN, SPN, TN intact  Motor EHL, FHL, lesser toe motor, Ext, flex, evers 5/5  DP 2+,No significant edema             Compartments are soft and nontender, no pain with passive stretching      Skin:    General: Skin is warm and dry.     Capillary Refill: Capillary refill takes less than 2 seconds.  Neurological:     General: No focal deficit present.     Mental Status: She is alert and oriented to person, place, and time.  Psychiatric:        Attention and Perception: Attention and perception normal. She is attentive.        Mood and Affect: Mood normal.        Speech: Speech normal.        Behavior: Behavior normal. Behavior is cooperative.        Thought Content: Thought content normal.        Judgment: Judgment normal.       Assessment/Plan:  71 year old right-hand-dominant female fall down approximately 12 stairs, polytrauma  -Fall down 12 stairs  - R proximal humerus fracture, R elbow fracture dislocation   Bedside reduction of right elbow fracture dislocation with splinting   Hematoma block  Postreduction CT scan of right elbow and right humerus  Patient will need surgical intervention to address her fractures later this week.  Anticipate this Thursday  Reviewed risks and benefits of closed reduction with patient and husband and they wish to proceed  -Left distal radius and ulna fracture   Bedside closed reduction and splinting   Hematoma block  Patient will need surgical intervention for her right wrist as well again would anticipate Thursday   Nonweightbearing bilateral upper extremities   Ice and elevate for swelling and pain control  - Pain management:  Multimodal - ABL anemia/Hemodynamics  Stable - Medical issues   For trauma  - Metabolic Bone Disease:  Check some basic labs  DEXA as outpatient  - Dispo:  Bedside closed reduction of right elbow fracture dislocation left distal radius  fracture  Anticipate OR on Thursday    [name], PA-C [phone] (C) 08/25/2022, 11:50 AM  [facility] [address] [place] [address] [phone] [phone] (F)    After 5pm and on the weekends please log on to Amion, go to orthopaedics and the look under the [facility] Call for the provider(s) on call. You can also call our office at [phone] and then follow the prompts to be connected to the call team.

## 2022-08-25 NOTE — H&P (Addendum)
Surgical Evaluation Chief Complaint: fall  HPI: 71yo woman who presents as a level 2 after a fall down about 12 stairs earlier this morning. She does not recall the event. Reports headache and R arm pain. Per her husband she was more altered initially.     No Known Allergies  Past Medical History:  Diagnosis Date   Anxiety    Arthritis    Depression    Hypertension     Past Surgical History:  Procedure Laterality Date   FRACTURE SURGERY      Family History  Problem Relation Age of Onset   Cancer Mother     Social History   Socioeconomic History   Marital status: Married    Spouse name: Not on file   Number of children: Not on file   Years of education: Not on file   Highest education level: Not on file  Occupational History   Not on file  Tobacco Use   Smoking status: Never   Smokeless tobacco: Never  Substance and Sexual Activity   Alcohol use: No    Alcohol/week: 0.0 standard drinks of alcohol   Drug use: No   Sexual activity: Yes    Birth control/protection: None  Other Topics Concern   Not on file  Social History Narrative   Not on file   Social Determinants of Health   Financial Resource Strain: Not on file  Food Insecurity: Not on file  Transportation Needs: Not on file  Physical Activity: Not on file  Stress: Not on file  Social Connections: Not on file    No current facility-administered medications on file prior to encounter.   Current Outpatient Medications on File Prior to Encounter  Medication Sig Dispense Refill   albuterol (PROVENTIL HFA;VENTOLIN HFA) 108 (90 Base) MCG/ACT inhaler Inhale 2 puffs into the lungs every 4 (four) hours as needed for wheezing (cough, shortness of breath or wheezing.). 1 Inhaler 1   ALPRAZolam (XANAX) 1 MG tablet Take 1 tablet (1 mg total) by mouth at bedtime. 30 tablet 5   atorvastatin (LIPITOR) 40 MG tablet TAKE ONE TABLET BY MOUTH ONCE DAILY 30 tablet 0   escitalopram (LEXAPRO) 10 MG tablet TAKE 1 TABLET BY  MOUTH ONCE DAILY 30 tablet 0   HYDROcodone-acetaminophen (NORCO) 5-325 MG tablet One half tablet every 6 hours as needed for pain 20 tablet 0   lisinopril (PRINIVIL,ZESTRIL) 20 MG tablet TAKE 1 TABLET BY MOUTH ONCE DAILY FOR BLOOD PRESSURE 30 tablet 0    Review of Systems: a complete, 10pt review of systems was completed with pertinent positives and negatives as documented in the HPI  Physical Exam: Vitals:   08/25/22 0630 08/25/22 0645  BP: (!) 156/103 (!) 137/103  Pulse: 81 82  Resp: 17 10  Temp:    SpO2: 98% 98%   Gen: A&Ox3, no distress. R periorbital ecchymosis Eyes: lids and conjunctivae normal, no icterus. Pupils equally round and reactive to light.  Neck: supple without mass or thyromegaly. No midline c spine tenderness.  Chest: respiratory effort is normal. No crepitus or tenderness on palpation of the chest. Breath sounds equal.  Cardiovascular: RRR with palpable distal pulses, no pedal edema Gastrointestinal: soft, nondistended, nontender. No mass, hepatomegaly or splenomegaly. No hernia. Lymphatic: no lymphadenopathy in the neck or groin Muscoloskeletal: no clubbing or cyanosis of the fingers.  Tenderness and deformity to R elbow, tenderness to r shoulder and l wrist. Neuro: cranial nerves grossly intact.  Sensation intact to light touch diffusely. Psych: appropriate  mood and affect, normal insight/judgment intact  Skin: warm and dry      Latest Ref Rng & Units 08/25/2022    5:08 AM 08/25/2022    5:06 AM 03/20/2016   10:46 AM  CBC  WBC 4.0 - 10.5 K/uL  11.1  4.2   Hemoglobin 12.0 - 15.0 g/dL 00.1  74.9  44.9   Hematocrit 36.0 - 46.0 % 42.0  43.3  42.7   Platelets 150 - 400 K/uL  163  136 Platelet count consistent in citrate        Latest Ref Rng & Units 08/25/2022    5:08 AM 08/25/2022    5:06 AM 01/13/2016    8:33 AM  CMP  Glucose 70 - 99 mg/dL 675  916  97   BUN 8 - 23 mg/dL 20  18  18    Creatinine 0.44 - 1.00 mg/dL  3.84  6.65   Sodium 135 - 145 mmol/L  142  140  140   Potassium 3.5 - 5.1 mmol/L 4.0  4.0  3.6   Chloride 98 - 111 mmol/L 107  110  105   CO2 22 - 32 mmol/L  21  30   Calcium 8.9 - 10.3 mg/dL  9.1  9.2   Total Protein 6.5 - 8.1 g/dL  6.2  6.4   Total Bilirubin 0.3 - 1.2 mg/dL  0.8  0.8   Alkaline Phos 38 - 126 U/L  67  78   AST 15 - 41 U/L  26  16   ALT 0 - 44 U/L  23  11     Lab Results  Component Value Date   INR 1.0 08/25/2022   INR 0.99 01/19/2011    Imaging: CT CHEST ABDOMEN PELVIS W CONTRAST  Result Date: 08/25/2022 CLINICAL DATA:  71 year old female status post trauma. Fall down a flight of stairs. EXAM: CT CHEST, ABDOMEN, AND PELVIS WITH CONTRAST TECHNIQUE: Multidetector CT imaging of the chest, abdomen and pelvis was performed following the standard protocol during bolus administration of intravenous contrast. RADIATION DOSE REDUCTION: This exam was performed according to the departmental dose-optimization program which includes automated exposure control, adjustment of the mA and/or kV according to patient size and/or use of iterative reconstruction technique. CONTRAST:  78 OMNIPAQUE IOHEXOL 300 MG/ML  SOLN COMPARISON:  CT cervical spine. FINDINGS: CT CHEST FINDINGS Cardiovascular: Mildly tortuous but intact thoracic aorta. No cardiomegaly or pericardial effusion. Mild for age atherosclerosis. Mediastinum/Nodes: Negative. No mediastinal hematoma, lymphadenopathy. Lungs/Pleura: Major airways are patent. Normal lung volumes. Minor lung base atelectasis or scarring. No pneumothorax. No pleural effusion. No pulmonary contusion. Musculoskeletal: Comminuted and impacted fracture of the right humerus head and neck. Associated mildly displaced fracture of the right scapula coracoid (series 5, image 111). Elsewhere the visible right scapula and clavicle appear intact. Contralateral left shoulder osseous structures appear intact. No rib or thoracic vertebral fracture identified. CT ABDOMEN PELVIS FINDINGS Hepatobiliary: Liver and  gallbladder appear intact with no perihepatic free fluid. Small oval nonenhancing cyst of the liver dome measures 7 mm and appears unchanged on delayed images (no follow-up imaging recommended). Pancreas: Negative. Spleen: Spleen appears intact with no perisplenic fluid. Adrenals/Urinary Tract: Intact, negative. Stomach/Bowel: Large bowel appears negative except for retained stool. Normal appendix on series 3, image 99. No dilated small bowel. No free air or free fluid. There is a 3.4 cm diverticulum of the proximal duodenum series 3, image 72. No active inflammation. Stomach and duodenum otherwise appear negative. Vascular/Lymphatic: Mild Calcified aortic  atherosclerosis. Major arterial structures in the abdomen and pelvis are patent. No lymphadenopathy. Portal venous system appears grossly patent on delayed images. Reproductive: Negative. Other: No pelvic free fluid.  Incidental pelvic phleboliths. Musculoskeletal: Normal lumbar segmentation. Mild grade 1 anterolisthesis of L4 on L5 appears to be degenerative with moderate facet arthropathy at both levels. Ununited L1 left transverse process ossification center, normal variant. Lumbar vertebrae, sacrum, SI joints, pelvis, and proximal femurs appear intact. IMPRESSION: 1. Comminuted and impacted fracture of the proximal right humerus, with associated mildly displaced fracture of the right scapula coracoid process. 2. No other acute traumatic injury identified in the chest, abdomen, or pelvis. Electronically Signed   By: Odessa Fleming M.D.   On: 08/25/2022 06:25   CT MAXILLOFACIAL WO CONTRAST  Result Date: 08/25/2022 CLINICAL DATA:  71 year old female status post trauma. Fall down a flight of stairs. EXAM: CT MAXILLOFACIAL WITHOUT CONTRAST TECHNIQUE: Multidetector CT imaging of the maxillofacial structures was performed. Multiplanar CT image reconstructions were also generated. RADIATION DOSE REDUCTION: This exam was performed according to the departmental  dose-optimization program which includes automated exposure control, adjustment of the mA and/or kV according to patient size and/or use of iterative reconstruction technique. COMPARISON:  CT head and cervical spine today reported separately. FINDINGS: Osseous: Evidence of chronic left TMJ degeneration. Mandible appears intact. Bilateral maxilla, pterygoid, zygoma, and nasal bones appear intact. No acute dental finding identified. Central skull base appears intact. Orbits: Intact bilateral orbital walls. Globes and intraorbital soft tissues appears symmetric and normal. Sinuses: Tympanic cavities, Visualized paranasal sinuses and mastoids are clear. Soft tissues: Right lateral and supraorbital scalp hematoma is mild and was also visible on head CT. No superficial match that no soft tissue gas identified. Negative visible noncontrast deep soft tissue spaces of the face. Limited intracranial: Reported separately. IMPRESSION: 1. No Face fracture identified.  Left TMJ degeneration. 2. Mild right lateral, supraorbital scalp hematoma. 3. Head and Cervical spine detailed separately. Electronically Signed   By: Odessa Fleming M.D.   On: 08/25/2022 06:17   CT CERVICAL SPINE WO CONTRAST  Result Date: 08/25/2022 CLINICAL DATA:  71 year old female status post trauma. Fall down a flight of stairs. EXAM: CT CERVICAL SPINE WITHOUT CONTRAST TECHNIQUE: Multidetector CT imaging of the cervical spine was performed without intravenous contrast. Multiplanar CT image reconstructions were also generated. RADIATION DOSE REDUCTION: This exam was performed according to the departmental dose-optimization program which includes automated exposure control, adjustment of the mA and/or kV according to patient size and/or use of iterative reconstruction technique. COMPARISON:  CT head and face today.  Chest CT today. FINDINGS: Alignment: Relatively maintained cervical lordosis. Cervicothoracic junction alignment is within normal limits. Bilateral  posterior element alignment is within normal limits. Skull base and vertebrae: Visualized skull base is intact. No atlanto-occipital dissociation. Congenital incomplete ossification of the posterior C1 ring. C1 and C2 otherwise appear intact and aligned. No acute osseous abnormality identified. Soft tissues and spinal canal: No prevertebral fluid or swelling. No visible canal hematoma. Negative visible noncontrast neck soft tissues. Disc levels: Advanced chronic disc and endplate degeneration at C6-C7, but mild for age elsewhere. Doubt significant spinal stenosis. There is moderate to severe bilateral C7 foraminal stenosis. Multilevel cervical facet degeneration mostly on the left side. Upper chest: Negative, and chest CT is reported separately. IMPRESSION: 1. No acute traumatic injury identified in the cervical spine. 2. 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 Electronically Signed   By: Althea Grimmer.D.  On: 08/25/2022 06:13   CT HEAD WO CONTRAST (5MM)  Addendum Date: 08/25/2022   ADDENDUM REPORT: 08/25/2022 05:58 ADDENDUM: Study discussed by telephone with Dr. Gerlene Fee on 08/25/2022 at 0557 hours. Electronically Signed   By: Genevie Ann M.D.   On: 08/25/2022 05:58   Result Date: 08/25/2022 CLINICAL DATA:  71 year old female status post trauma. Fall down a flight of stairs. EXAM: CT HEAD WITHOUT CONTRAST TECHNIQUE: Contiguous axial images were obtained from the base of the skull through the vertex without intravenous contrast. RADIATION DOSE REDUCTION: This exam was performed according to the departmental dose-optimization program which includes automated exposure control, adjustment of the mA and/or kV according to patient size and/or use of iterative reconstruction technique. COMPARISON:  CT face and cervical spine today reported separately. FINDINGS: Brain: Incidental dural calcification of the prepontine cistern. Basilar cisterns are normal. Small  volume posterior para falcine subdural blood along the falx (series 3, image 24). There is also evidence of trace left hemisphere subdural blood on coronal image 35, 1-2 mm only. Trace bilateral subarachnoid blood over the superior convexities. No IVH or ventriculomegaly. No cerebral hemorrhagic contusion identified. Gray-white matter differentiation appears within normal limits for age. No ventriculomegaly. No cortically based acute infarct identified. No intracranial mass effect or midline shift. Vascular: Calcified atherosclerosis at the skull base. Skull: No skull fracture identified. Facial bones and cervical spine detailed separately. Sinuses/Orbits: Visualized paranasal sinuses and mastoids are clear. Other: Right anterior frontal convexity scalp hematoma best seen on coronal image 18, up to 5 mm in thickness. Underlying calvarium appears intact. No other discrete orbit or scalp soft tissue injury identified. Face CT is reported separately. Proximal right humerus fracture visible on the scout view. IMPRESSION: 1. Trace left side and para falcine Subdural Hematoma. Trace bilateral Subarachnoid Blood. No cerebral hemorrhagic contusion or intracranial mass effect. 2. Right scalp soft tissue injury with no skull fracture identified. Facial bones and cervical spine detailed separately. 3. Proximal Right Humerus Fracture visible on the scout view. Electronically Signed: By: Genevie Ann M.D. On: 08/25/2022 05:54     A/P: 71yo woman s/p fall down stairs  -SDH, SAH- nsg consulted by dr Sedonia Small. Admit to icu for q1h neuro checks, empiric keppra, repeat ct this afternoon  -R prox humerus fx, R elbow fx/disloc, L wrist fx- final reads pending. Ortho consulted by Dr. Sedonia Small.     Patient Active Problem List   Diagnosis Date Noted   Trauma 08/25/2022   Thrombocytopenia ([place]) 02/24/2016   Insomnia 08/09/2015   Snoring 10/14/2014   Hypertension 03/09/2012   Hyperlipidemia 03/09/2012   Depression 03/09/2012        Romana Juniper, MD [facility]  See AMION to contact appropriate on-call provider

## 2022-08-25 NOTE — Progress Notes (Signed)
Orthopedic Tech Progress Note Patient Details:  Vanessa [name] 23-Oct-1951 [id number]  Patient ID: [name], female   DOB: November 23, 1951, 71 y.o.   MRN: [id number] I attended trauma page Trinna Post 08/25/2022, 5:16 AM

## 2022-08-25 NOTE — Progress Notes (Signed)
PT Cancellation Note  Patient Details Name: Vanessa [name] MRN: [id number] DOB: [birth year]   Cancelled Treatment:    Reason Eval/Treat Not Completed: Patient at procedure or test/unavailable. Will check back tomorrow.   Lyanne Co, PT  Acute Rehab Services Secure chat preferred Office [phone]    Elyse Hsu 08/25/2022, 3:30 PM

## 2022-08-25 NOTE — Progress Notes (Addendum)
Orthopaedic Trauma Service follow up   Imaging reviewed of right upper extremity  Highly comminuted four-part right proximal humerus fracture with extensive joint involvement of the articular surface of the proximal humerus as well as fractures of the inferior glenoid fracture and coracoid process.   Would recommend total shoulder arthroplasty to address this injury.  I have reached out to one of our total shoulder colleagues---> Dr. Aundria Rud to evaluate   CT R elbow demonstrates a highly comminuted right radial head fracture along with right distal humerus fracture.  This will need to be addressed surgically as well plan on ORIF Thursday of the right distal humerus, right radial head replacement and repair of right elbow dislocation.  Also ORIF of left distal radius   NWB B UEx for now Ice and elevate Sling R UEx for comfort   Ok to sit up in bed and transfer to chair from ortho standpoint  [name], PA-C [phone] (C) 08/25/2022, 7:20 PM  [facility] [address] [place] [address] [phone] Dwain Sarna, PA-C [phone] (C) 08/25/2022, 7:19 PM  [facility] [address] [place] [address] [phone] (F)       Patient ID: Vanessa [name], female   DOB: 12/30/1950, 71 y.o.   MRN: [id number]

## 2022-08-25 NOTE — Progress Notes (Signed)
[facility] Procedure   Dx: Closed right elbow fracture dislocation, closed left distal radius and ulna fractures  Clinician: Oletha Blend, PA-C  Complications: none  Anesthesia: Hematoma block left distal radius with 10 cc of 1% lidocaine without epinephrine, hematoma block right elbow with 10 cc of 1% lidocaine without epinephrine.  100 mcg of fentanyl, 2 mg of Versed  Procedure   71 year old female sustained a fall down about 12 stairs at home earlier this morning late last night.  She was found down by her husband.  She was brought in to [facility] where she was found to have numerous injuries including subarachnoid hemorrhage, right elbow fracture dislocation, left distal radius and ulna fracture and right proximal humerus fracture.  Patient was transported up to the trauma ICU.  Right elbow remains dislocated and is not splinted.  Right arm is in a sling only.  left wrist is not splinted other than the blue EMS foam splint.  Risks and benefits of closed reduction discussed with the patient and husband.  She will require surgical intervention for all of her injuries most likely.  We discussed that close reduction would help with pain control as we await for OR.  Patient and husband are in agreement with bedside reduction.   Left distal radius was prepped with Betadine.  Hematoma block was performed entering the fracture site distal radius small rush of blood was returned with retraction of the plunger and then 10 cc of 1% plain lidocaine was infiltrated into the zone of fracture.  Turning attention to the right elbow lateral aspect was identified and again prepped with Betadine and area was infiltrated with a separate 23-gauge needle into the joint and fracture site.  Small return of blood was obtained.  10 cc of lidocaine was infiltrated into this region as well.  Again this with 1% plain lidocaine.  I turned our attention back to the left upper extremity finger traps were  applied and 5 pounds of weight was also applied and the patient was left in traction while we turned our attention to the right elbow for reduction.  Prior to initiation of elbow reduction 1 mg of Versed and 50 mcg of fentanyl were administered.  Bedside nurse was present the entire time.  After several minutes without adequate effect another 2 mg another 50 mcg of fentanyl was given this time with adequate effect for reduction.  Nonrebreather mask was applied as well.  gentle longitudinal traction was applied to the right forearm while simultaneously bringing her elbow into flexion along with a anterior applied force over the olecranon a palpable clunk was appreciated signifying successful reduction.  Restoration of full range of motion arc was noted at this time.  While I held the reduction a posterior long-arm splint from wrist to mid upper arm was applied with the help to additional assistance ([employer]'s) side strut was also applied for further stability.  After adequate caring sling was then placed back on and our attention was then turned to the distal radius.  Reduction attempt was continued to the left upper extremity while in the finger traps due to the force was applied stabilizing the upper arm as well as manipulating the distal radius.  Force applied from the dorsal aspect of the distal radius directed volarly was applied to the fracture site after additional length was achieved along with some deformity correction the finger traps were removed while I maintain reduction and a coaptation splint was once again applied by [employer]  assistance.  Reduction was held until the plaster splint fully cured.  Patient tolerated all procedures well.  She was monitored with O2 monitoring along with nonrebreather mask.  No additional issues were noted.  Plain x-rays of her right elbow and left distal radius are pending we will also obtain a CT scan of her right elbow and her right proximal humerus to further  evaluate her proximal humerus fracture along with her right elbow fracture dislocation.  Anticipate return to the OR on Thursday for ORIF right proximal humerus, ORIF left distal radius and ulna as well as repair of right elbow fracture dislocation which I would anticipate LCL repair and radial head replacement   [name], PA-C [phone] (C) 08/25/2022, 11:51 AM  [facility] [address] [place] [address] [phone] Collier Bullock (F)  Patient ID: Vanessa [name], female   DOB: [birth year], 71 y.o.   MRN: [id number]

## 2022-08-25 NOTE — ED Provider Notes (Addendum)
[facility] Emergency Department Provider Note MRN:  [id number]  Arrival date & time: 08/25/22     Chief Complaint   Fall  History of Present Illness   Vanessa [name] is a 71 y.o. year-old female with no pertinent past medical history presenting to the ED with chief complaint of fall.  Patient found at the bottom of the stairs.  She is altered and perseverating with EMS.  Evidence of head trauma, gross deformities to the right elbow, left wrist.  100 mcg fentanyl in route.  Review of Systems  I was unable to obtain a full/accurate HPI, PMH, or ROS due to the patient's altered mental status.  Patient's Health History    Past Medical History:  Diagnosis Date   Anxiety    Arthritis    Depression    Hypertension     Past Surgical History:  Procedure Laterality Date   FRACTURE SURGERY      Family History  Problem Relation Age of Onset   Cancer Mother     Social History   Socioeconomic History   Marital status: Married    Spouse name: Not on file   Number of children: Not on file   Years of education: Not on file   Highest education level: Not on file  Occupational History   Not on file  Tobacco Use   Smoking status: Never   Smokeless tobacco: Never  Substance and Sexual Activity   Alcohol use: No    Alcohol/week: 0.0 standard drinks of alcohol   Drug use: No   Sexual activity: Yes    Birth control/protection: None  Other Topics Concern   Not on file  Social History Narrative   Not on file   Social Determinants of Health   Financial Resource Strain: Not on file  Food Insecurity: Not on file  Transportation Needs: Not on file  Physical Activity: Not on file  Stress: Not on file  Social Connections: Not on file  Intimate Partner Violence: Not on file     Physical Exam   Vitals:   08/25/22 0741 08/25/22 0742  BP:    Pulse: 89 86  Resp: 17 (!) 21  Temp:    SpO2: 95% 94%    CONSTITUTIONAL: Well-appearing,  NAD NEURO/PSYCH: Awake and alert, follows commands, repetitive questioning EYES:  eyes equal and reactive ENT/NECK:  no LAD, no JVD CARDIO: Regular rate, well-perfused, normal S1 and S2 PULM:  CTAB no wheezing or rhonchi GI/GU:  non-distended, non-tender MSK/SPINE: Gross deformity to left wrist, right elbow SKIN:  no rash, atraumatic   *Additional and/or pertinent findings included in MDM below  Diagnostic and Interventional Summary    EKG Interpretation  Date/Time:    Ventricular Rate:    PR Interval:    QRS Duration:   QT Interval:    QTC Calculation:   R Axis:     Text Interpretation:         Labs Reviewed  COMPREHENSIVE METABOLIC PANEL - Abnormal; Notable for the following components:      Result Value   CO2 21 (*)    Glucose, Bld 157 (*)    Total Protein 6.2 (*)    All other components within normal limits  CBC - Abnormal; Notable for the following components:   WBC 11.1 (*)    All other components within normal limits  LACTIC ACID, PLASMA - Abnormal; Notable for the following components:   Lactic Acid, Venous 2.0 (*)    All other  components within normal limits  I-STAT CHEM 8, ED - Abnormal; Notable for the following components:   Glucose, Bld 159 (*)    Calcium, Ion 1.12 (*)    All other components within normal limits  RESP PANEL BY RT-PCR (FLU A&B, COVID) ARPGX2  ETHANOL  PROTIME-INR  URINALYSIS, ROUTINE W REFLEX MICROSCOPIC  HIV ANTIBODY (ROUTINE TESTING W REFLEX)  SAMPLE TO BLOOD BANK    DG Forearm Right  Final Result    DG Humerus Right  Final Result    DG Pelvis Portable  Final Result    DG Chest Port 1 View  Final Result    DG Wrist Complete Left  Final Result    CT HEAD WO CONTRAST ( )  Final Result  Addendum (preliminary) 1 of 1  ADDENDUM REPORT: 08/25/2022 05:58    ADDENDUM:  Study discussed by telephone with Dr. Kennis Carina on 08/25/2022 at  0557 hours.      Electronically Signed    By: Odessa Fleming M.D.    On: 08/25/2022  05:58      Final    CT CERVICAL SPINE WO CONTRAST  Final Result    CT MAXILLOFACIAL WO CONTRAST  Final Result    CT CHEST ABDOMEN PELVIS W CONTRAST  Final Result    CT HEAD WO CONTRAST ( )    (Results Pending)  DG Elbow 2 Views Right    (Results Pending)    Medications  0.9 %  sodium chloride infusion ( Intravenous New Bag/Given 08/25/22 0743)  acetaminophen (TYLENOL) tablet 1,000 mg (has no administration in time range)  oxyCODONE (Oxy IR/ROXICODONE) immediate release tablet 5 mg (has no administration in time range)  oxyCODONE (Oxy IR/ROXICODONE) immediate release tablet 10 mg (has no administration in time range)  HYDROmorphone (DILAUDID) injection 0.5 mg (has no administration in time range)  methocarbamol (ROBAXIN) tablet 500 mg (has no administration in time range)    Or  methocarbamol (ROBAXIN) 500 mg in dextrose 5 % 50 mL IVPB (has no administration in time range)  docusate sodium (COLACE) capsule 100 mg (has no administration in time range)  bisacodyl (DULCOLAX) suppository 10 mg (has no administration in time range)  ondansetron (ZOFRAN-ODT) disintegrating tablet 4 mg (has no administration in time range)    Or  ondansetron (ZOFRAN) injection 4 mg (has no administration in time range)  levETIRAcetam (KEPPRA) IVPB 500 mg/100 mL premix (has no administration in time range)  metoprolol tartrate (LOPRESSOR) injection 5 mg (has no administration in time range)  hydrALAZINE (APRESOLINE) injection 10 mg (has no administration in time range)  albuterol (PROVENTIL) (2.5 MG/3ML) 0.083% nebulizer solution 3 mL (has no administration in time range)  escitalopram (LEXAPRO) tablet 10 mg (has no administration in time range)  fentaNYL (SUBLIMAZE) injection 50 mcg (50 mcg Intravenous Given 08/25/22 0602)  iohexol (OMNIPAQUE) 300 MG/ML solution 100 mL (100 mLs Intravenous Contrast Given 08/25/22 0602)  HYDROmorphone (DILAUDID) injection 1 mg (1 mg Intravenous Given 08/25/22 0656)   propofol (DIPRIVAN) 10 mg/mL bolus/IV push 94.8 mg (94.8 mg Intravenous Given 08/25/22 0735)     Procedures  /  Critical Care .Critical Care  Performed by: Sabas Sous, MD Authorized by: Sabas Sous, MD   Critical care provider statement:    Critical care time (minutes):  35   Critical care was necessary to treat or prevent imminent or life-threatening deterioration of the following conditions:  Trauma   Critical care was time spent personally by me on the following activities:  Development  of treatment plan with patient or surrogate, discussions with consultants, evaluation of patient's response to treatment, examination of patient, ordering and review of laboratory studies, ordering and review of radiographic studies, ordering and performing treatments and interventions, pulse oximetry, re-evaluation of patient's condition and review of old charts Reduction of dislocation  Date/Time: 08/25/2022 7:45 AM  Performed by: Sabas Sous, MD Authorized by: Sabas Sous, MD  Consent: Verbal consent obtained. Written consent obtained. Risks and benefits: risks, benefits and alternatives were discussed Consent given by: spouse Patient understanding: patient states understanding of the procedure being performed Patient consent: the patient's understanding of the procedure matches consent given Procedure consent: procedure consent matches procedure scheduled Relevant documents: relevant documents present and verified Test results: test results available and properly labeled Imaging studies: imaging studies available Patient identity confirmed: verbally with patient Time out: Immediately prior to procedure a "time out" was called to verify the correct patient, procedure, equipment, support staff and site/side marked as required. Local anesthesia used: no  Anesthesia: Local anesthesia used: no  Sedation: Patient sedated: yes Sedatives: propofol  Patient tolerance: patient  tolerated the procedure well with no immediate complications Comments: Reduction of right elbow fracture/dislocation.   .Sedation  Date/Time: 08/25/2022 7:46 AM  Performed by: Sabas Sous, MD Authorized by: Sabas Sous, MD   Consent:    Consent obtained:  Verbal and written   Consent given by:  Spouse   Risks discussed:  Allergic reaction, dysrhythmia, vomiting, respiratory compromise necessitating ventilatory assistance and intubation, prolonged hypoxia resulting in organ damage, nausea and inadequate sedation Universal protocol:    Immediately prior to procedure, a time out was called: yes     Patient identity confirmed:  Verbally with patient Indications:    Procedure performed:  Dislocation reduction   Procedure necessitating sedation performed by:  Physician performing sedation Pre-sedation assessment:    Time since last food or drink:  4 hours   ASA classification: class 1 - normal, healthy patient     Mouth opening:  3 or more finger widths   Mallampati score:  I - soft palate, uvula, fauces, pillars visible   Neck mobility: normal     Pre-sedation assessments completed and reviewed: airway patency, cardiovascular function, hydration status, mental status, nausea/vomiting, pain level, respiratory function and temperature   Immediate pre-procedure details:    Reassessment: Patient reassessed immediately prior to procedure     Reviewed: vital signs and relevant labs/tests     Verified: bag valve mask available, emergency equipment available, intubation equipment available, IV patency confirmed, oxygen available and suction available   Procedure details (see MAR for exact dosages):    Preoxygenation:  Nasal cannula   Sedation:  Propofol   Intended level of sedation: deep   Analgesia:  Hydromorphone   Intra-procedure monitoring:  Blood pressure monitoring, cardiac monitor, continuous pulse oximetry, continuous capnometry, frequent LOC assessments and frequent vital sign  checks   Intra-procedure management:  Airway repositioning and BVM ventilation   Total Provider sedation time (minutes):  25 Post-procedure details:    Attendance: Constant attendance by certified staff until patient recovered     Recovery: Patient returned to pre-procedure baseline     Post-sedation assessments completed and reviewed: airway patency, cardiovascular function, hydration status, mental status, nausea/vomiting, pain level, respiratory function and temperature     Patient is stable for discharge or admission: yes     Procedure completion:  Tolerated well, no immediate complications Comments:     Mild apnea after only  50 mg propofol, treated easily with brief chin thrust, bag-valve-mask.   ED Course and Medical Decision Making  Initial Impression and Ddx Concern for orthopedic injuries, possible intracranial bleeding, given the patient is altered with distracting injuries there is also concern for more occult intrathoracic or intra-abdominal injury.  Awaiting imaging.  Level 2 trauma.  Past medical/surgical history that increases complexity of ED encounter: None  Interpretation of Diagnostics I personally reviewed the shoulder x-ray, elbow x-ray and my interpretation is as follows: Proximal humerus fracture, fracture dislocation of the elbow  Called by radiology, there is some small subarachnoid and subdural hemorrhage within the brain, no midline shift  Patient Reassessment and Ultimate Disposition/Management     Patient continues to be conversant, mildly confused, no focal neurological deficits.  Neurosurgery was consulted and will see the patient here shortly.  Will consult orthopedics, will admit to trauma service.  7:30 AM update: Case discussed with orthopedic surgery who will follow.  They recommend reduction of the elbow here in the emergency department, performed as described above.  Patient management required discussion with the following services or consulting  groups:  General/Trauma Surgery, Neurosurgery, and Orthopedic Surgery  Complexity of Problems Addressed Acute illness or injury that poses threat of life of bodily function  Additional Data Reviewed and Analyzed Further history obtained from: Further history from spouse/family member  Additional Factors Impacting ED Encounter Risk Use of parenteral controlled substances and Consideration of hospitalization  Elmer Sow. Pilar Plate, MD [facility] Emergency Medicine [facility] [email or web address]  Final Clinical Impressions(s) / ED Diagnoses     ICD-10-CM   1. Subarachnoid hemorrhage (HCC)  I60.9     2. Fall down stairs, initial encounter  W10.8XXA     3. Closed fracture of proximal end of right humerus, unspecified fracture morphology, initial encounter  I78.676H       ED Discharge Orders     None        Discharge Instructions Discussed with and Provided to Patient:   Discharge Instructions   None      Sabas Sous, MD 08/25/22 2094    Sabas Sous, MD 08/25/22 [phone]

## 2022-08-25 NOTE — Consult Note (Signed)
Reason for Consult:sdh Referring Physician: EDP  Vanessa [name] is an 71 y.o. female.   HPI:  71 year old female who fell down the stairs last night presented to the ED. She sustained two broke arms. She denies any headaches nausea vomiting or dizziness. She is resting comfortably in bed with arms in slings. Denies any blood thinners  Past Medical History:  Diagnosis Date   Anxiety    Arthritis    Depression    Hypertension     Past Surgical History:  Procedure Laterality Date   FRACTURE SURGERY      Allergies  Allergen Reactions   Ace Inhibitors Cough    Social History   Tobacco Use   Smoking status: Never   Smokeless tobacco: Never  Substance Use Topics   Alcohol use: No    Alcohol/week: 0.0 standard drinks of alcohol    Family History  Problem Relation Age of Onset   Cancer Mother      Review of Systems  Positive ROS: as above  All other systems have been reviewed and were otherwise negative with the exception of those mentioned in the HPI and as above.  Objective: Vital signs in last 24 hours: Temp:  [97.9 F (36.6 C)-98 F (36.7 C)] 98 F (36.7 C) (09/12 0658) Pulse Rate:  [76-90] 86 (09/12 0742) Resp:  [10-21] 21 (09/12 0742) BP: (124-156)/(85-103) 133/93 (09/12 0740) SpO2:  [94 %-100 %] 94 % (09/12 0742) Weight:  [94.8 kg] 94.8 kg (09/12 0510)  General Appearance: Alert, cooperative, no distress, appears stated age Head: Normocephalic, without obvious abnormality, atraumatic Eyes: PERRL, conjunctiva/corneas clear, EOM's intact, fundi benign, both eyes      Lungs: respirations unlabored Heart: Regular rate and rhythm Extremities: Extremities normal, atraumatic, no cyanosis or edema Pulses: 2+ and symmetric all extremities Skin: Skin color, texture, turgor normal, no rashes or lesions  NEUROLOGIC:   Mental status: A&O x4, no aphasia, good attention span, Memory and fund of knowledge Motor Exam - grossly normal, normal tone and bulk Sensory  Exam - grossly normal Reflexes: symmetric, no pathologic reflexes, No Hoffman's, No clonus Coordination - grossly normal Gait - not tested Balance - not tested Cranial Nerves: I: smell Not tested  II: visual acuity  OS: na    OD: na  II: visual fields Full to confrontation  II: pupils Equal, round, reactive to light  III,VII: ptosis None  III,IV,VI: extraocular muscles  Full ROM  V: mastication Normal  V: facial light touch sensation  Normal  V,VII: corneal reflex  Present  VII: facial muscle function - upper  Normal  VII: facial muscle function - lower Normal  VIII: hearing Not tested  IX: soft palate elevation  Normal  IX,X: gag reflex Present  XI: trapezius strength  5/5  XI: sternocleidomastoid strength 5/5  XI: neck flexion strength  5/5  XII: tongue strength  Normal    Data Review Lab Results  Component Value Date   WBC 11.1 (H) 08/25/2022   HGB 14.3 08/25/2022   HCT 42.0 08/25/2022   MCV 95.4 08/25/2022   PLT 163 08/25/2022   Lab Results  Component Value Date   NA 142 08/25/2022   K 4.0 08/25/2022   CL 107 08/25/2022   CO2 21 (L) 08/25/2022   BUN 20 08/25/2022   CREATININE 0.70 08/25/2022   GLUCOSE 159 (H) 08/25/2022   Lab Results  Component Value Date   INR 1.0 08/25/2022    Radiology: DG Elbow 2 Views Right  Result Date: 08/25/2022 CLINICAL DATA:  Status post reduction. EXAM: RIGHT ELBOW - 2 VIEW COMPARISON:  Right forearm and right humerus radiographs 08/25/2022; FINDINGS: Persistent posterior dislocation of the radius ulna with respect the distal humerus. On limited views there again fracture distal lateral humerus centered within the lateral epicondyle. Mildly comminuted and mildly displaced proximal radial fracture. Tip of the coronoid process fracture. IMPRESSION: Persistent posterior dislocation of the radius with respect to the distal humerus. Electronically Signed   By: Neita Garnetonald  Viola M.D.   On: 08/25/2022 08:10   DG Forearm Right  Result Date:  08/25/2022 CLINICAL DATA:  71 year old female status post trauma. Fall down a flight of stairs. Right arm deformity. EXAM: RIGHT FOREARM - 2 VIEW COMPARISON:  Right humerus series today. FINDINGS: Posterior fracture dislocation at the right elbow. Proximal right radius appears comminuted with displacement. Proximal right ulna may remain intact. There is evidence of a lateral supracondylar fracture of the distal right humerus. Distal radius and ulna appear intact. Right wrist alignment appears maintained. Carpal bones appear intact and aligned. Metacarpals appear intact. IMPRESSION: 1. Posterior fracture dislocation at the right elbow with comminution of the proximal right radius and fracture also of the adjacent lateral supracondylar humerus. Proximal right ulna probably remains intact. 2. No fracture or dislocation identified at the right wrist. Electronically Signed   By: Odessa FlemingH  Hall M.D.   On: 08/25/2022 07:41   DG Humerus Right  Result Date: 08/25/2022 CLINICAL DATA:  71 year old female status post trauma. Fall down a flight of stairs. Right arm deformity. EXAM: RIGHT HUMERUS - 2+ VIEW COMPARISON:  CT Chest, Abdomen, and Pelvis today reported separately. FINDINGS: Comminuted and impacted proximal right humerus fracture. Associated mildly displaced right coracoid fracture better demonstrated by CT. Posterior fracture dislocation superimposed at the right elbow. Proximal radius fracture is apparent. Right humerus shaft remains intact. Soft tissue hematoma and/or contusion at the lateral mid humerus and also at the elbow. IMPRESSION: 1. Posterior fracture dislocation at the right elbow with comminution of the proximal right radius. 2. Comminuted and impacted proximal right humerus fracture with right coracoid fracture better demonstrated by CT. 3. Multifocal right arm soft tissue hematoma/contusion. Electronically Signed   By: Odessa FlemingH  Hall M.D.   On: 08/25/2022 07:32   DG Pelvis Portable  Result Date:  08/25/2022 CLINICAL DATA:  71 year old female status post trauma. Fall down a flight of stairs. Wrist and shoulder deformities. EXAM: PORTABLE PELVIS 1-2 VIEWS COMPARISON:  CT Chest, Abdomen, and Pelvis today reported separately. FINDINGS: Portable AP view at 0518 hours. Femoral heads normally located. Pelvis appears intact. Bone mineralization is within normal limits for age. Grossly intact proximal femurs. Negative visible bowel gas. Incidental pelvic phleboliths. IMPRESSION: No acute fracture or dislocation identified about the pelvis. Electronically Signed   By: Odessa FlemingH  Hall M.D.   On: 08/25/2022 07:30   DG Chest Port 1 View  Result Date: 08/25/2022 CLINICAL DATA:  71 year old female status post trauma. Fall down a flight of stairs. Wrist and shoulder deformities. EXAM: PORTABLE CHEST 1 VIEW COMPARISON:  CT Chest, Abdomen, and Pelvis today reported separately. FINDINGS: Portable AP view at 0511 hours. Proximal right humerus and coracoid fracture better demonstrated by CT. Lung volumes and mediastinal contours are within normal limits. Visualized tracheal air column is within normal limits. Allowing for portable technique the lungs are clear. IMPRESSION: 1. No acute cardiopulmonary abnormality. 2. Proximal right humerus and coracoid fractures better demonstrated by CT. Electronically Signed   By: Althea GrimmerH  Hall M.D.  On: 08/25/2022 07:29   DG Wrist Complete Left  Result Date: 08/25/2022 CLINICAL DATA:  70 year old female status post trauma. Fall down a flight of stairs. Wrist and shoulder deformities. EXAM: LEFT WRIST - COMPLETE 3+ VIEW COMPARISON:  None Available. FINDINGS: AP, oblique, and cross-table lateral views of the left wrist demonstrate comminuted fractures of both the distal left radius and ulna. Oblique and comminuted distal ulna shaft transverse fracture with 1/2 shaft width ulnar and 1 full shaft width dorsal displacement. Ulnar styloid fracture superimposed. Highly impacted distal left radius  metadiaphysis fracture with DRU and radiocarpal joint involvement. One full shaft width dorsal displacement and over-riding. No definite superimposed carpal fracture or dislocation. Metacarpals appear intact. IMPRESSION: Comminuted and displaced fractures of both the distal left radius and ulna. Highly impacted distal radius fracture with DRU and radiocarpal joint involvement. Electronically Signed   By: Odessa Fleming M.D.   On: 08/25/2022 07:28   CT CHEST ABDOMEN PELVIS W CONTRAST  Result Date: 08/25/2022 CLINICAL DATA:  71 year old female status post trauma. Fall down a flight of stairs. EXAM: CT CHEST, ABDOMEN, AND PELVIS WITH CONTRAST TECHNIQUE: Multidetector CT imaging of the chest, abdomen and pelvis was performed following the standard protocol during bolus administration of intravenous contrast. RADIATION DOSE REDUCTION: This exam was performed according to the departmental dose-optimization program which includes automated exposure control, adjustment of the mA and/or kV according to patient size and/or use of iterative reconstruction technique. CONTRAST:  OMNIPAQUE IOHEXOL 300 MG/ML  SOLN COMPARISON:  CT cervical spine. FINDINGS: CT CHEST FINDINGS Cardiovascular: Mildly tortuous but intact thoracic aorta. No cardiomegaly or pericardial effusion. Mild for age atherosclerosis. Mediastinum/Nodes: Negative. No mediastinal hematoma, lymphadenopathy. Lungs/Pleura: Major airways are patent. Normal lung volumes. Minor lung base atelectasis or scarring. No pneumothorax. No pleural effusion. No pulmonary contusion. Musculoskeletal: Comminuted and impacted fracture of the right humerus head and neck. Associated mildly displaced fracture of the right scapula coracoid (series 5, image 111). Elsewhere the visible right scapula and clavicle appear intact. Contralateral left shoulder osseous structures appear intact. No rib or thoracic vertebral fracture identified. CT ABDOMEN PELVIS FINDINGS Hepatobiliary: Liver and  gallbladder appear intact with no perihepatic free fluid. Small oval nonenhancing cyst of the liver dome measures 7 mm and appears unchanged on delayed images (no follow-up imaging recommended). Pancreas: Negative. Spleen: Spleen appears intact with no perisplenic fluid. Adrenals/Urinary Tract: Intact, negative. Stomach/Bowel: Large bowel appears negative except for retained stool. Normal appendix on series 3, image 99. No dilated small bowel. No free air or free fluid. There is a 3.4 cm diverticulum of the proximal duodenum series 3, image 72. No active inflammation. Stomach and duodenum otherwise appear negative. Vascular/Lymphatic: Mild Calcified aortic atherosclerosis. Major arterial structures in the abdomen and pelvis are patent. No lymphadenopathy. Portal venous system appears grossly patent on delayed images. Reproductive: Negative. Other: No pelvic free fluid.  Incidental pelvic phleboliths. Musculoskeletal: Normal lumbar segmentation. Mild grade 1 anterolisthesis of L4 on L5 appears to be degenerative with moderate facet arthropathy at both levels. Ununited L1 left transverse process ossification center, normal variant. Lumbar vertebrae, sacrum, SI joints, pelvis, and proximal femurs appear intact. IMPRESSION: 1. Comminuted and impacted fracture of the proximal right humerus, with associated mildly displaced fracture of the right scapula coracoid process. 2. No other acute traumatic injury identified in the chest, abdomen, or pelvis. Electronically Signed   By: Odessa Fleming M.D.   On: 08/25/2022 06:25   CT MAXILLOFACIAL WO CONTRAST  Result Date: 08/25/2022 CLINICAL DATA:  71 year old female status post trauma. Fall down a flight of stairs. EXAM: CT MAXILLOFACIAL WITHOUT CONTRAST TECHNIQUE: Multidetector CT imaging of the maxillofacial structures was performed. Multiplanar CT image reconstructions were also generated. RADIATION DOSE REDUCTION: This exam was performed according to the departmental  dose-optimization program which includes automated exposure control, adjustment of the mA and/or kV according to patient size and/or use of iterative reconstruction technique. COMPARISON:  CT head and cervical spine today reported separately. FINDINGS: Osseous: Evidence of chronic left TMJ degeneration. Mandible appears intact. Bilateral maxilla, pterygoid, zygoma, and nasal bones appear intact. No acute dental finding identified. Central skull base appears intact. Orbits: Intact bilateral orbital walls. Globes and intraorbital soft tissues appears symmetric and normal. Sinuses: Tympanic cavities, Visualized paranasal sinuses and mastoids are clear. Soft tissues: Right lateral and supraorbital scalp hematoma is mild and was also visible on head CT. No superficial match that no soft tissue gas identified. Negative visible noncontrast deep soft tissue spaces of the face. Limited intracranial: Reported separately. IMPRESSION: 1. No Face fracture identified.  Left TMJ degeneration. 2. Mild right lateral, supraorbital scalp hematoma. 3. Head and Cervical spine detailed separately. Electronically Signed   By: Odessa Fleming M.D.   On: 08/25/2022 06:17   CT CERVICAL SPINE WO CONTRAST  Result Date: 08/25/2022 CLINICAL DATA:  71 year old female status post trauma. Fall down a flight of stairs. EXAM: CT CERVICAL SPINE WITHOUT CONTRAST TECHNIQUE: Multidetector CT imaging of the cervical spine was performed without intravenous contrast. Multiplanar CT image reconstructions were also generated. RADIATION DOSE REDUCTION: This exam was performed according to the departmental dose-optimization program which includes automated exposure control, adjustment of the mA and/or kV according to patient size and/or use of iterative reconstruction technique. COMPARISON:  CT head and face today.  Chest CT today. FINDINGS: Alignment: Relatively maintained cervical lordosis. Cervicothoracic junction alignment is within normal limits. Bilateral  posterior element alignment is within normal limits. Skull base and vertebrae: Visualized skull base is intact. No atlanto-occipital dissociation. Congenital incomplete ossification of the posterior C1 ring. C1 and C2 otherwise appear intact and aligned. No acute osseous abnormality identified. Soft tissues and spinal canal: No prevertebral fluid or swelling. No visible canal hematoma. Negative visible noncontrast neck soft tissues. Disc levels: Advanced chronic disc and endplate degeneration at C6-C7, but mild for age elsewhere. Doubt significant spinal stenosis. There is moderate to severe bilateral C7 foraminal stenosis. Multilevel cervical facet degeneration mostly on the left side. Upper chest: Negative, and chest CT is reported separately. IMPRESSION: 1. No acute traumatic injury identified in the cervical spine. 2. 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 Electronically Signed   By: Odessa Fleming M.D.   On: 08/25/2022 06:13   CT HEAD WO CONTRAST ( )  Addendum Date: 08/25/2022   ADDENDUM REPORT: 08/25/2022 05:58 ADDENDUM: Study discussed by telephone with Dr. Kennis Carina on 08/25/2022 at 0557 hours. Electronically Signed   By: Odessa Fleming M.D.   On: 08/25/2022 05:58   Result Date: 08/25/2022 CLINICAL DATA:  71 year old female status post trauma. Fall down a flight of stairs. EXAM: CT HEAD WITHOUT CONTRAST TECHNIQUE: Contiguous axial images were obtained from the base of the skull through the vertex without intravenous contrast. RADIATION DOSE REDUCTION: This exam was performed according to the departmental dose-optimization program which includes automated exposure control, adjustment of the mA and/or kV according to patient size and/or use of iterative reconstruction technique. COMPARISON:  CT face and cervical spine today reported separately. FINDINGS: Brain: Incidental  dural calcification of the prepontine cistern. Basilar cisterns are normal. Small  volume posterior para falcine subdural blood along the falx (series 3, image 24). There is also evidence of trace left hemisphere subdural blood on coronal image 35, 1-2 mm only. Trace bilateral subarachnoid blood over the superior convexities. No IVH or ventriculomegaly. No cerebral hemorrhagic contusion identified. Gray-white matter differentiation appears within normal limits for age. No ventriculomegaly. No cortically based acute infarct identified. No intracranial mass effect or midline shift. Vascular: Calcified atherosclerosis at the skull base. Skull: No skull fracture identified. Facial bones and cervical spine detailed separately. Sinuses/Orbits: Visualized paranasal sinuses and mastoids are clear. Other: Right anterior frontal convexity scalp hematoma best seen on coronal image 18, up to 5 mm in thickness. Underlying calvarium appears intact. No other discrete orbit or scalp soft tissue injury identified. Face CT is reported separately. Proximal right humerus fracture visible on the scout view. IMPRESSION: 1. Trace left side and para falcine Subdural Hematoma. Trace bilateral Subarachnoid Blood. No cerebral hemorrhagic contusion or intracranial mass effect. 2. Right scalp soft tissue injury with no skull fracture identified. Facial bones and cervical spine detailed separately. 3. Proximal Right Humerus Fracture visible on the scout view. Electronically Signed: By: Odessa Fleming M.D. On: 08/25/2022 05:54    Assessment/Plan: 72 year old female presented to the ED after falling down stairs. CT head shows a very tiny parafalcine SDH and a tiny SAH. No neurosurgical intervention is warranted. Probably dont even need a repeat head CT unless she has a neuro change. Please call with any concerns.    Tiana Loft [facility] 08/25/2022 8:13 AM

## 2022-08-26 LAB — BASIC METABOLIC PANEL
Anion gap: 7 (ref 5–15)
BUN: 16 mg/dL (ref 8–23)
CO2: 22 mmol/L (ref 22–32)
Calcium: 8.5 mg/dL — ABNORMAL LOW (ref 8.9–10.3)
Chloride: 109 mmol/L (ref 98–111)
Creatinine, Ser: 0.71 mg/dL (ref 0.44–1.00)
GFR, Estimated: >60 mL/min (ref >60)
Glucose, Bld: 134 mg/dL — ABNORMAL HIGH (ref 70–99)
Potassium: 3.8 mmol/L (ref 3.5–5.1)
Sodium: 138 mmol/L (ref 135–145)

## 2022-08-26 LAB — CBC
HCT: 34.1 % — ABNORMAL LOW (ref 36.0–46.0)
Hemoglobin: 11.6 g/dL — ABNORMAL LOW (ref 12.0–15.0)
MCH: 32.3 pg (ref 26.0–34.0)
MCHC: 34 g/dL (ref 30.0–36.0)
MCV: 95 fL (ref 80.0–100.0)
Platelets: 147 10*3/uL — ABNORMAL LOW (ref 150–400)
RBC: 3.59 MIL/uL — ABNORMAL LOW (ref 3.87–5.11)
RDW: 13.3 % (ref 11.5–15.5)
WBC: 7.2 10*3/uL (ref 4.0–10.5)
nRBC: 0 % (ref 0.0–0.2)

## 2022-08-26 LAB — MRSA NEXT GEN BY PCR, NASAL: MRSA by PCR Next Gen: NOT DETECTED

## 2022-08-26 MED ORDER — METHOCARBAMOL 500 MG PO TABS
1000.0000 mg | ORAL_TABLET | Freq: Three times a day (TID) | ORAL | Status: DC
  Administered 2022-08-26 – 2022-09-03 (×24): 1000 mg via ORAL
  Filled 2022-08-26 (×24): qty 2

## 2022-08-26 MED ORDER — CEFAZOLIN SODIUM-DEXTROSE 2-4 GM/100ML-% IV SOLN
2.0000 g | INTRAVENOUS | Status: AC
  Administered 2022-08-27 (×2): 2 g via INTRAVENOUS
  Filled 2022-08-26: qty 100

## 2022-08-26 MED ORDER — ZOLPIDEM TARTRATE 5 MG PO TABS
5.0000 mg | ORAL_TABLET | Freq: Every evening | ORAL | Status: DC | PRN
  Administered 2022-08-26 – 2022-08-27 (×2): 5 mg via ORAL
  Filled 2022-08-26 (×2): qty 1

## 2022-08-26 MED ORDER — KETOROLAC TROMETHAMINE 15 MG/ML IJ SOLN
15.0000 mg | Freq: Four times a day (QID) | INTRAMUSCULAR | Status: AC
  Administered 2022-08-26 – 2022-08-31 (×19): 15 mg via INTRAVENOUS
  Filled 2022-08-26 (×19): qty 1

## 2022-08-26 MED ORDER — DULOXETINE HCL 60 MG PO CPEP
60.0000 mg | ORAL_CAPSULE | Freq: Every day | ORAL | Status: DC
  Administered 2022-08-28 – 2022-09-03 (×6): 60 mg via ORAL
  Filled 2022-08-26 (×7): qty 1

## 2022-08-26 MED ORDER — GABAPENTIN 300 MG PO CAPS
300.0000 mg | ORAL_CAPSULE | Freq: Every day | ORAL | Status: DC
  Administered 2022-08-26 – 2022-09-02 (×8): 300 mg via ORAL
  Filled 2022-08-26 (×8): qty 1

## 2022-08-26 MED ORDER — LEVOTHYROXINE SODIUM 50 MCG PO TABS
50.0000 ug | ORAL_TABLET | Freq: Every day | ORAL | Status: DC
  Administered 2022-08-27 – 2022-09-03 (×8): 50 ug via ORAL
  Filled 2022-08-26 (×8): qty 1

## 2022-08-26 MED ORDER — ENOXAPARIN SODIUM 30 MG/0.3ML IJ SOSY
30.0000 mg | PREFILLED_SYRINGE | Freq: Two times a day (BID) | INTRAMUSCULAR | Status: DC
  Administered 2022-08-26 – 2022-09-03 (×14): 30 mg via SUBCUTANEOUS
  Filled 2022-08-26 (×15): qty 0.3

## 2022-08-26 NOTE — Progress Notes (Signed)
Orthopaedic Trauma Service Progress Note  Patient ID: Vanessa [name] MRN: [id number] DOB/AGE: 12-30-1950 71 y.o.  Subjective:  Doing well Pain markedly improved after reductions completed   Reviewed CT findings with pt and family (husband and daughter)  Recommend R total shoulder arthroplasty   ORIF R distal humerus and radial head arthroplasty along with ligament repair for elbow dislocation   ORIF L distal radius and ulna   ROS As above  Objective:   VITALS:   Vitals:   08/26/22 0400 08/26/22 0500 08/26/22 0600 08/26/22 0800  BP: (!) 109/49 112/62 112/71   Pulse: 98 (!) 101 99   Resp: 18 18 19    Temp: 98.6 F (37 C)   98.4 F (36.9 C)  TempSrc: Oral   Oral  SpO2: 96% 97% 95%   Weight:      Height:        Estimated body mass index is 33.73 kg/m as calculated from the following:   Height as of this encounter: 5\' 6"  (1.676 m).   Weight as of this encounter: 94.8 kg.   Intake/Output      09/12 0701 09/13 0700 09/13 0701 09/14 0700   P.O. 120    I.V. (mL/kg) 1664.5 (17.6)    IV Piggyback 183.6    Total Intake(mL/kg) 1968.1 (20.8)    Urine (mL/kg/hr) 300 (0.1)    Total Output 300    Net +1668.1           LABS  Results for orders placed or performed during the hospital encounter of 08/25/22 (from the past 24 hour(s))  HIV Antibody (routine testing w rflx)     Status: None   Collection Time: 08/25/22  9:40 AM  Result Value Ref Range   HIV Screen 4th Generation wRfx Non Reactive Non Reactive  MRSA Next Gen by PCR, Nasal     Status: None   Collection Time: 08/25/22  8:41 PM   Specimen: Nasal Mucosa; Nasal Swab  Result Value Ref Range   MRSA by PCR Next Gen NOT DETECTED NOT DETECTED  Basic metabolic panel     Status: Abnormal   Collection Time: 08/26/22  2:16 AM  Result Value Ref Range   Sodium 138 135 - 145 mmol/L   Potassium 3.8 3.5 - 5.1 mmol/L   Chloride 109 98 - 111 mmol/L    CO2 22 22 - 32 mmol/L   Glucose, Bld 134 (H) 70 - 99 mg/dL   BUN 16 8 - 23 mg/dL   Creatinine, Ser 10/25/22 0.44 - 1.00 mg/dL   Calcium 8.5 (L) 8.9 - 10.3 mg/dL   GFR, Estimated 08/28/22 4.43 mL/min   Anion gap 7 5 - 15  CBC     Status: Abnormal   Collection Time: 08/26/22  3:03 AM  Result Value Ref Range   WBC 7.2 4.0 - 10.5 K/uL   RBC 3.59 (L) 3.87 - 5.11 MIL/uL   Hemoglobin 11.6 (L) 12.0 - 15.0 g/dL   HCT >40 (L) 08/28/22 - 08.6 %   MCV 95.0 80.0 - 100.0 fL   MCH 32.3 26.0 - 34.0 pg   MCHC 34.0 30.0 - 36.0 g/dL   RDW 76.1 95.0 - 93.2 %   Platelets 147 (L) 150 - 400 K/uL   nRBC 0.0 0.0 - 0.2 %  PHYSICAL EXAM:   Gen: resting comfortably in bed, NAD, appears well, very pleasant  Lungs: unlabored  Cardiac: reg Ext:       Right Upper Extremity   Sling and posterior long arm splint in place   Radial, ulnar, median motor and sensory functions intact  Axillary nerve motor and sensory functions grossly intact  AIN and PIN motor intact  Ext warm   Brisk cap refill   Swelling controlled       Left upper extremity   Sugartong splint fitting well  Mild swelling   Radial, ulnar, median motor and sensory functions intact  AiN and PIN motor intact  No pain out of proportion with passive stretching   Brisk cap refill   Good perfusion distally       Principal Problem:   Trauma   Anti-infectives (From admission, onward)    None      71 year old right-hand-dominant female fall down approximately 12 stairs, polytrauma   -Fall down 12 stairs   - R proximal humerus fracture  High comminuted  Recommend total shoulder arthoplasty   Dr. Aundria Rud to see   - R elbow fracture dislocation ---> R distal humerus fracture and comminuted R radial head fracture  OR tomorrow for ORIF R distal humerus, R radial head arthroplasty and repair of elbow dislocation    -Left distal radius and ulna fracture              OR tomorrow for ORIF    NWB B UEx for now    - Pain management:                Multimodal  - ABL anemia/Hemodynamics               Stable - Medical issues                For trauma  - FEN  NPO after MN    - Metabolic Bone Disease:               Check some basic labs               DEXA as outpatient   - Dispo:              OR tomorrow for R elbow and L wrist    Unsure of timing for R shoulder---> defer to Dr. Fredrik Cove    Could be done in the outpatient setting if necessary      [name], PA-C [phone] (C) 08/26/2022, 9:08 AM  [facility] [address] [place] [address] [phone] [phone] (F)    After 5pm and on the weekends please log on to Amion, go to orthopaedics and the look under the [facility] Call for the provider(s) on call. You can also call our office at [phone] and then follow the prompts to be connected to the call team.   Patient ID: Vanessa [name], female   DOB: 1951-06-27, 71 y.o.   MRN: [id number]

## 2022-08-26 NOTE — Progress Notes (Signed)
   Trauma/Critical Care Follow Up Note  Subjective:    Overnight Issues:   Objective:  Vital signs for last 24 hours: Temp:  [98.3 F (36.8 C)-98.9 F (37.2 C)] 98.4 F (36.9 C) (09/13 1200) Pulse Rate:  [74-101] 99 (09/13 0600) Resp:  [13-22] 19 (09/13 0600) BP: (108-127)/(49-98) 112/71 (09/13 0600) SpO2:  [95 %-99 %] 95 % (09/13 0600)  Hemodynamic parameters for last 24 hours:    Intake/Output from previous day: 09/12 0701 - 09/13 0700 In: 1968.1 [P.O.:120; I.V.:1664.5; IV Piggyback:183.6] Out: 300 [Urine:300]  Intake/Output this shift: No intake/output data recorded.  Vent settings for last 24 hours:    Physical Exam:  Gen: comfortable, no distress Neuro: non-focal exam HEENT: PERRL Neck: supple CV: RRR Pulm: unlabored breathing Abd: soft, NT GU: clear yellow urine Extr: wwp, no edema   Results for orders placed or performed during the hospital encounter of 08/25/22 (from the past 24 hour(s))  MRSA Next Gen by PCR, Nasal     Status: None   Collection Time: 08/25/22  8:41 PM   Specimen: Nasal Mucosa; Nasal Swab  Result Value Ref Range   MRSA by PCR Next Gen NOT DETECTED NOT DETECTED  Basic metabolic panel     Status: Abnormal   Collection Time: 08/26/22  2:16 AM  Result Value Ref Range   Sodium 138 135 - 145 mmol/L   Potassium 3.8 3.5 - 5.1 mmol/L   Chloride 109 98 - 111 mmol/L   CO2 22 22 - 32 mmol/L   Glucose, Bld 134 (H) 70 - 99 mg/dL   BUN 16 8 - 23 mg/dL   Creatinine, Ser 2.99 0.44 - 1.00 mg/dL   Calcium 8.5 (L) 8.9 - 10.3 mg/dL   GFR, Estimated >37 >16 mL/min   Anion gap 7 5 - 15  CBC     Status: Abnormal   Collection Time: 08/26/22  3:03 AM  Result Value Ref Range   WBC 7.2 4.0 - 10.5 K/uL   RBC 3.59 (L) 3.87 - 5.11 MIL/uL   Hemoglobin 11.6 (L) 12.0 - 15.0 g/dL   HCT 96.7 (L) 89.3 - 81.0 %   MCV 95.0 80.0 - 100.0 fL   MCH 32.3 26.0 - 34.0 pg   MCHC 34.0 30.0 - 36.0 g/dL   RDW 17.5 10.2 - 58.5 %   Platelets 147 (L) 150 - 400 K/uL    nRBC 0.0 0.0 - 0.2 %    Assessment & Plan: The plan of care was discussed with the bedside nurse for the day, [place] E, who is in agreement with this plan and no additional concerns were raised.   Present on Admission:  Trauma    LOS: 1 day   Additional comments:I reviewed the patient's new clinical lab test results.   and I reviewed the patients new imaging test results.    Fall down stairs   SDH, SAH - NSGY c/s, keppra for sz ppx, repeat CT negative. SLP eval. R prox humerus fx, R elbow fx/disloc, L wrist fx - Ortho c/s, plan for OR 9/14 FEN - reg diet DVT - SCDs, LMWH Dispo - 4NP    Diamantina Monks, MD Trauma & General Surgery Please use [email or web address] to contact on call provider  08/26/2022  *Care during the described time interval was provided by me. I have reviewed this patient's available data, including medical history, events of note, physical examination and test results as part of my evaluation.

## 2022-08-26 NOTE — Evaluation (Signed)
Occupational Therapy Evaluation Patient Details Name: Vanessa [name] MRN: [id number] DOB: 1951-02-20 Today's Date: 08/26/2022   History of Present Illness Pt is a 71 yo female who presented to [facility] on 08/25/22 after falling down 12 stairs at her home and sustaining SAH, R elbow fx with dislocation, L distal radius and ulna fx, and R proximal humerus fx. BUE surgery planned for 9/14. PMH: HTN, anxiety, depression, arthritis.   Clinical Impression   Vanessa [name] was evaluated s/p the above admission list, she is indep at baseline and lives with her husband who will be able to assist 24/7 at d/c. Upon evaluation pt had functional limitations due to multiple BUE fx's and NWB status, impaired cognition, poor balance, and decreased activity tolerance. Overall she required min A for bed mobility, transfers and short distance functional mobility. Due to BUE impairments, she required maximal assist for ADLs. She will benefit from OT acutely. Anticipate great progression, recommend d/c to home with family support and OP OT.      Recommendations for follow up therapy are one component of a multi-disciplinary discharge planning process, led by the attending physician.  Recommendations may be updated based on patient status, additional functional criteria and insurance authorization.   Follow Up Recommendations  Outpatient OT    Assistance Recommended at Discharge Frequent or constant Supervision/Assistance  Patient can return home with the following A little help with walking and/or transfers;A lot of help with bathing/dressing/bathroom;Assistance with cooking/housework;Assistance with feeding;Direct supervision/assist for medications management;Direct supervision/assist for financial management;Help with stairs or ramp for entrance;Assist for transportation    Functional Status Assessment  Patient has had a recent decline in their functional status and demonstrates the ability to make significant  improvements in function in a reasonable and predictable amount of time.  Equipment Recommendations  BSC/3in1    Recommendations for Other Services       Precautions / Restrictions Precautions Precautions: Fall Required Braces or Orthoses: Splint/Cast Splint/Cast: BUE splints Restrictions Weight Bearing Restrictions: Yes RUE Weight Bearing: Non weight bearing LUE Weight Bearing: Non weight bearing      Mobility Bed Mobility Overal bed mobility: Needs Assistance Bed Mobility: Supine to Sit     Supine to sit: Min assist     General bed mobility comments: assist for cues to sequence, maintain BUE NWB and for trunk elevation    Transfers Overall transfer level: Needs assistance Equipment used: None Transfers: Sit to/from Stand Sit to Stand: Min assist                  Balance Overall balance assessment: Needs assistance Sitting-balance support: Feet supported Sitting balance-Leahy Scale: Good     Standing balance support: No upper extremity supported, During functional activity Standing balance-Leahy Scale: Fair Standing balance comment: min A - close min G                           ADL either performed or assessed with clinical judgement   ADL Overall ADL's : Needs assistance/impaired Eating/Feeding: Moderate assistance;Sitting   Grooming: Moderate assistance;Sitting   Upper Body Bathing: Maximal assistance;Sitting   Lower Body Bathing: Maximal assistance;Sit to/from stand   Upper Body Dressing : Maximal assistance;Sitting   Lower Body Dressing: Maximal assistance;Sit to/from stand   Toilet Transfer: Minimal assistance;Ambulation   Toileting- Clothing Manipulation and Hygiene: Maximal assistance;Sit to/from stand       Functional mobility during ADLs: Minimal assistance General ADL Comments: limited by BUE NWB and ROM restrictions  Vision Baseline Vision/History: 0 No visual deficits Vision Assessment?: No apparent visual  deficits Additional Comments: overall WFL, would benefit from further assessment     Perception     Praxis      Pertinent Vitals/Pain Pain Assessment Pain Assessment: Faces Faces Pain Scale: Hurts a little bit Pain Location: RUE with any movement Pain Descriptors / Indicators: Discomfort Pain Intervention(s): Limited activity within patient's tolerance     Hand Dominance     Extremity/Trunk Assessment Upper Extremity Assessment Upper Extremity Assessment: RUE deficits/detail;LUE deficits/detail RUE Deficits / Details: several fxs, sling donned. sx planned 9/14. Pt is able to wiggle fingers, and denies any sensation changes RUE: Unable to fully assess due to pain;Unable to fully assess due to immobilization RUE Sensation: WNL RUE Coordination: decreased fine motor;decreased gross motor LUE Deficits / Details: full shoulder ROM, able to wiggle fingers. denies sensation changes. sx planned 9/14 LUE Sensation: WNL LUE Coordination: decreased fine motor   Lower Extremity Assessment Lower Extremity Assessment: Defer to PT evaluation   Cervical / Trunk Assessment Cervical / Trunk Assessment: Normal   Communication Communication Communication: No difficulties   Cognition Arousal/Alertness: Awake/alert Behavior During Therapy: Impulsive Overall Cognitive Status: Impaired/Different from baseline Area of Impairment: Attention, Memory, Following commands, Safety/judgement, Awareness, Problem solving                   Current Attention Level: Sustained Memory: Decreased short-term memory Following Commands: Follows one step commands consistently Safety/Judgement: Decreased awareness of deficits Awareness: Emergent Problem Solving: Difficulty sequencing, Requires verbal cues, Slow processing General Comments: Overall orietned, required significantly increased time for orientation questions. Unable to recall details of fall. Needed cues for attention and sequencing. Some  congnition impairments likely due to medication     General Comments  VSS on RA, family present and supportive    Exercises     Shoulder Instructions      Home Living Family/patient expects to be discharged to:: Private residence Living Arrangements: Spouse/significant other Available Help at Discharge: Family;Available 24 hours/day Type of Home: House Home Access: Stairs to enter [employer] of Steps: 3 Entrance Stairs-Rails: None Home Layout: Two level;Bed/bath upstairs Alternate Level Stairs-Number of Steps: flight   Bathroom Shower/Tub: Producer, television/film/video: Standard     Home Equipment: Agricultural consultant (2 wheels);Cane - single point;Shower seat   Additional Comments: lives with husband for 24/7 support. no bedroom downstairs, 3x lift recliner chiars (she can sleep in) and 1/2 bath downstairs      Prior Functioning/Environment Prior Level of Function : Independent/Modified Independent;Driving             Mobility Comments: no AD ADLs Comments: indep        OT Problem List: Decreased strength;Decreased range of motion;Decreased activity tolerance;Impaired balance (sitting and/or standing);Decreased cognition;Decreased safety awareness;Decreased knowledge of precautions;Impaired UE functional use;Pain      OT Treatment/Interventions: Self-care/ADL training;Therapeutic exercise;DME and/or AE instruction;Therapeutic activities;Patient/family education;Balance training    OT Goals(Current goals can be found in the care plan section) Acute Rehab OT Goals Patient Stated Goal: to feel better OT Goal Formulation: With patient Time For Goal Achievement: 09/09/22 Potential to Achieve Goals: Good ADL Goals Pt Will Perform Grooming: with set-up;sitting Pt Will Perform Upper Body Dressing: with min assist;sitting Pt Will Perform Lower Body Dressing: with min assist;sit to/from stand Pt Will Transfer to Toilet: with modified  independence;ambulating Additional ADL Goal #1: pt will indep maintain BUE precautions throughout ADL tasks  OT Frequency: Min 2X/week  Co-evaluation              AM-PAC OT "6 Clicks" Daily Activity     Outcome Measure Help from another person eating meals?: A Lot Help from another person taking care of personal grooming?: A Lot Help from another person toileting, which includes using toliet, bedpan, or urinal?: A Lot Help from another person bathing (including washing, rinsing, drying)?: A Lot Help from another person to put on and taking off regular upper body clothing?: A Lot Help from another person to put on and taking off regular lower body clothing?: A Lot 6 Click Score: 12   End of Session Equipment Utilized During Treatment: Gait belt Nurse Communication: Mobility status  Activity Tolerance: Patient tolerated treatment well Patient left: in chair;with call bell/phone within reach;with chair alarm set;with family/visitor present  OT Visit Diagnosis: Unsteadiness on feet (R26.81);Muscle weakness (generalized) (M62.81);History of falling (Z91.81);Pain                Time: 1139-1200 OT Time Calculation (min): 21 min Charges:  OT General Charges $OT Visit: 1 Visit OT Evaluation $OT Eval Moderate Complexity: 1 Mod    Uliana Brinker D Causey 08/26/2022, 3:01 PM

## 2022-08-26 NOTE — Progress Notes (Signed)
Patient ID: Vanessa [name], female   DOB: [birth year], 71 y.o.   MRN: [id number] Patient doing well transferring to a different room ambulating in the room without complaints no new neurosurgical recommendations

## 2022-08-26 NOTE — TOC CAGE-AID Note (Signed)
Transition of Care [facility]) - CAGE-AID Screening   Patient Details  Name: QUITA MCGRORY MRN: [id number] Date of Birth: 03-01-1951  Clinical Narrative:  Patient denies any alcohol or drug use, no need for substance abuse resources at this time.  CAGE-AID Screening:    Have You Ever Felt You Ought to Cut Down on Your Drinking or Drug Use?: No Have People Annoyed You By Critizing Your Drinking Or Drug Use?: No Have You Felt Bad Or Guilty About Your Drinking Or Drug Use?: No Have You Ever Had a Drink or Used Drugs First Thing In The Morning to Steady Your Nerves or to Get Rid of a Hangover?: No CAGE-AID Score: 0  Substance Abuse Education Offered: No

## 2022-08-26 NOTE — Evaluation (Signed)
Physical Therapy Evaluation Patient Details Name: Vanessa [name] MRN: [id number] DOB: 30-Nov-1951 Today's Date: 08/26/2022  History of Present Illness  Pt is a 71 yo female who presented to [facility] on 08/25/22 after falling down 12 stairs at her home and sustaining SAH, R elbow fx with dislocation, L distal radius and ulna fx, and R proximal humerus fx. BUE surgery planned for 9/14. PMH: HTN, anxiety, depression, arthritis.   Clinical Impression  Pt presents with condition above and deficits mentioned below, see PT Problem List. PTA, she was independent without an AD for mobility, but displaying some instability per family. She lives with her husband in a 2-level house (she can stay on the main level though) with 3 STE. Currently, pt demonstrates deficits in R lower extremity strength, balance, activity tolerance, and cognition. She is at risk for falls, requiring up to [facility] for all functional mobility. Recommending follow-up with OPPT and family support at d/c. Will continue to follow acutely.       Recommendations for follow up therapy are one component of a multi-disciplinary discharge planning process, led by the attending physician.  Recommendations may be updated based on patient status, additional functional criteria and insurance authorization.  Follow Up Recommendations Outpatient PT      Assistance Recommended at Discharge Frequent or constant Supervision/Assistance  Patient can return home with the following  A little help with walking and/or transfers;Assistance with cooking/housework;Help with stairs or ramp for entrance;Assist for transportation;Direct supervision/assist for financial management;Direct supervision/assist for medications management;A lot of help with bathing/dressing/bathroom    Equipment Recommendations Other (comment) (TBA pending WB status following surgery)  Recommendations for Other Services       Functional Status Assessment Patient has had a recent decline  in their functional status and demonstrates the ability to make significant improvements in function in a reasonable and predictable amount of time.     Precautions / Restrictions Precautions Precautions: Fall Required Braces or Orthoses: Splint/Cast Splint/Cast: BUE splints Restrictions Weight Bearing Restrictions: Yes RUE Weight Bearing: Non weight bearing LUE Weight Bearing: Non weight bearing      Mobility  Bed Mobility Overal bed mobility: Needs Assistance Bed Mobility: Supine to Sit     Supine to sit: Min assist     General bed mobility comments: assist for cues to sequence, maintain BUE NWB and for trunk elevation    Transfers Overall transfer level: Needs assistance Equipment used: None Transfers: Sit to/from Stand Sit to Stand: Min assist, +2 safety/equipment           General transfer comment: Light minA for stability coming to stand from EOB without using her UEs.    Ambulation/Gait Ambulation/Gait assistance: Min assist, +2 safety/equipment Gait Distance (Feet): 80 Feet Assistive device: None Gait Pattern/deviations: Step-through pattern, Decreased stride length, Decreased dorsiflexion - right, Wide base of support Gait velocity: reduced Gait velocity interpretation: <1.31 ft/sec, indicative of household ambulator   General Gait Details: Pt with wide BOS and decreased R foot clearance, needing cues to lift foot with mod momentary success noted. MinA for stability as pt has excessive trunk sway and intermittent stumbling.  Stairs            Wheelchair Mobility    Modified Rankin (Stroke Patients Only)       Balance Overall balance assessment: Needs assistance Sitting-balance support: Feet supported Sitting balance-Leahy Scale: Good     Standing balance support: No upper extremity supported, During functional activity Standing balance-Leahy Scale: Fair Standing balance comment: min A -  close min G                              Pertinent Vitals/Pain Pain Assessment Pain Assessment: Faces Faces Pain Scale: Hurts a little bit Pain Location: RUE with any movement Pain Descriptors / Indicators: Discomfort Pain Intervention(s): Limited activity within patient's tolerance, Monitored during session, Repositioned    Home Living Family/patient expects to be discharged to:: Private residence Living Arrangements: Spouse/significant other Available Help at Discharge: Family;Available 24 hours/day Type of Home: House Home Access: Stairs to enter Entrance Stairs-Rails: None Entrance Stairs-Number of Steps: 3 Alternate Level Stairs-Number of Steps: flight Home Layout: Two level;Bed/bath upstairs Home Equipment: Agricultural consultant (2 wheels);Cane - single point;Shower seat Additional Comments: lives with husband for 24/7 support. no bedroom downstairs, 3x lift recliner chairs (she can sleep in) and 1/2 bath downstairs    Prior Function Prior Level of Function : Independent/Modified Independent;Driving             Mobility Comments: no AD ADLs Comments: indep     Hand Dominance        Extremity/Trunk Assessment   Upper Extremity Assessment Upper Extremity Assessment: Defer to OT evaluation RUE Deficits / Details: several fxs, sling donned. sx planned 9/14. Pt is able to wiggle fingers, and denies any sensation changes RUE: Unable to fully assess due to pain;Unable to fully assess due to immobilization RUE Sensation: WNL RUE Coordination: decreased fine motor;decreased gross motor LUE Deficits / Details: full shoulder ROM, able to wiggle fingers. denies sensation changes. sx planned 9/14 LUE Sensation: WNL LUE Coordination: decreased fine motor    Lower Extremity Assessment Lower Extremity Assessment: RLE deficits/detail RLE Deficits / Details: MMT scores of 4- hip flexion (4+ on L), 4 knee extension (5 on L), 5 ankle dorsiflexion (5 on L); denies numbness/tingling bil, able to detect location of  touch RLE Sensation: WNL    Cervical / Trunk Assessment Cervical / Trunk Assessment: Normal  Communication   Communication: No difficulties  Cognition Arousal/Alertness: Awake/alert Behavior During Therapy: Impulsive Overall Cognitive Status: Impaired/Different from baseline Area of Impairment: Attention, Memory, Following commands, Safety/judgement, Awareness, Problem solving                   Current Attention Level: Sustained Memory: Decreased short-term memory Following Commands: Follows one step commands consistently Safety/Judgement: Decreased awareness of deficits Awareness: Emergent Problem Solving: Difficulty sequencing, Requires verbal cues, Slow processing General Comments: Overall oriented, required significantly increased time for orientation questions. Unable to recall details of fall. Needed cues for attention and sequencing. Some congnition impairments likely due to medication        General Comments General comments (skin integrity, edema, etc.): VSS on RA, family present and supportive; educated husband on required assistance at d/c due to risk for falls, provided him with a gait belt    Exercises     Assessment/Plan    PT Assessment Patient needs continued PT services  PT Problem List Decreased strength;Decreased activity tolerance;Decreased balance;Decreased mobility;Decreased cognition;Pain       PT Treatment Interventions DME instruction;Gait training;Stair training;Functional mobility training;Therapeutic activities;Therapeutic exercise;Balance training;Cognitive remediation;Neuromuscular re-education;Patient/family education    PT Goals (Current goals can be found in the Care Plan section)  Acute Rehab PT Goals Patient Stated Goal: to get better PT Goal Formulation: With patient/family Time For Goal Achievement: 09/09/22 Potential to Achieve Goals: Good    Frequency Min 4X/week     Co-evaluation PT/OT/SLP Co-Evaluation/Treatment:  Yes Reason for Co-Treatment: For patient/therapist safety;To address functional/ADL transfers PT goals addressed during session: Mobility/safety with mobility;Balance         AM-PAC PT "6 Clicks" Mobility  Outcome Measure Help needed turning from your back to your side while in a flat bed without using bedrails?: A Little Help needed moving from lying on your back to sitting on the side of a flat bed without using bedrails?: A Little Help needed moving to and from a bed to a chair (including a wheelchair)?: A Little Help needed standing up from a chair using your arms (e.g., wheelchair or bedside chair)?: A Little Help needed to walk in hospital room?: A Little Help needed climbing 3-5 steps with a railing? : A Lot 6 Click Score: 17    End of Session Equipment Utilized During Treatment: Gait belt Activity Tolerance: Patient tolerated treatment well Patient left: in chair;with call bell/phone within reach;with chair alarm set;with family/visitor present Nurse Communication: Mobility status PT Visit Diagnosis: Unsteadiness on feet (R26.81);Other abnormalities of gait and mobility (R26.89);Muscle weakness (generalized) (M62.81);Difficulty in walking, not elsewhere classified (R26.2);Pain Pain - Right/Left: Right Pain - part of body: Arm    Time: 3244-0102 PT Time Calculation (min) (ACUTE ONLY): 30 min   Charges:   PT Evaluation $PT Eval Moderate Complexity: 1 Mod          Raymond Gurney, PT, DPT Acute Rehabilitation Services  Office: [phone]   [name] 08/26/2022, 3:51 PM

## 2022-08-27 ENCOUNTER — Encounter (HOSPITAL_COMMUNITY): Payer: Self-pay

## 2022-08-27 ENCOUNTER — Other Ambulatory Visit: Payer: Self-pay

## 2022-08-27 ENCOUNTER — Inpatient Hospital Stay (HOSPITAL_COMMUNITY): Payer: Medicare Other

## 2022-08-27 ENCOUNTER — Inpatient Hospital Stay (HOSPITAL_COMMUNITY): Payer: Medicare Other | Admitting: Certified Registered"

## 2022-08-27 ENCOUNTER — Encounter (HOSPITAL_COMMUNITY): Admission: EM | Disposition: A | Discharge status: Rehabilitation Facility | Payer: Self-pay | Source: Home / Self Care

## 2022-08-27 DIAGNOSIS — I10 Essential (primary) hypertension: Secondary | ICD-10-CM

## 2022-08-27 DIAGNOSIS — S52502A Unspecified fracture of the lower end of left radius, initial encounter for closed fracture: Secondary | ICD-10-CM

## 2022-08-27 DIAGNOSIS — S52121A Displaced fracture of head of right radius, initial encounter for closed fracture: Secondary | ICD-10-CM | POA: Diagnosis not present

## 2022-08-27 DIAGNOSIS — S42201A Unspecified fracture of upper end of right humerus, initial encounter for closed fracture: Secondary | ICD-10-CM | POA: Diagnosis not present

## 2022-08-27 HISTORY — PX: OPEN REDUCTION INTERNAL FIXATION (ORIF) DISTAL RADIAL FRACTURE: SHX5989

## 2022-08-27 HISTORY — PX: ELBOW LIGAMENT RECONSTRUCTION: SHX6359

## 2022-08-27 HISTORY — PX: ORIF ULNAR FRACTURE: SHX5417

## 2022-08-27 HISTORY — PX: RADIAL HEAD ARTHROPLASTY: SHX6044

## 2022-08-27 LAB — CBC
HCT: 29.4 % — ABNORMAL LOW (ref 36.0–46.0)
Hemoglobin: 9.8 g/dL — ABNORMAL LOW (ref 12.0–15.0)
MCH: 31.8 pg (ref 26.0–34.0)
MCHC: 33.3 g/dL (ref 30.0–36.0)
MCV: 95.5 fL (ref 80.0–100.0)
Platelets: 109 10*3/uL — ABNORMAL LOW (ref 150–400)
RBC: 3.08 MIL/uL — ABNORMAL LOW (ref 3.87–5.11)
RDW: 13.3 % (ref 11.5–15.5)
WBC: 6.8 10*3/uL (ref 4.0–10.5)
nRBC: 0 % (ref 0.0–0.2)

## 2022-08-27 LAB — VITAMIN D 25 HYDROXY (VIT D DEFICIENCY, FRACTURES): Vit D, 25-Hydroxy: 37.08 ng/mL (ref 30–100)

## 2022-08-27 SURGERY — ARTHROPLASTY, RADIUS, HEAD
Anesthesia: General | Site: Elbow | Laterality: Right

## 2022-08-27 MED ORDER — FENTANYL CITRATE (PF) 250 MCG/5ML IJ SOLN
INTRAMUSCULAR | Status: DC | PRN
  Administered 2022-08-27: 150 ug via INTRAVENOUS
  Administered 2022-08-27: 100 ug via INTRAVENOUS

## 2022-08-27 MED ORDER — ONDANSETRON HCL 4 MG/2ML IJ SOLN: 4.0000 mg | Freq: Once | INTRAMUSCULAR | Status: DC | PRN

## 2022-08-27 MED ORDER — FENTANYL CITRATE (PF) 100 MCG/2ML IJ SOLN
INTRAMUSCULAR | Status: AC
  Filled 2022-08-27: qty 2

## 2022-08-27 MED ORDER — ROCURONIUM BROMIDE 100 MG/10ML IV SOLN
INTRAVENOUS | Status: DC | PRN
  Administered 2022-08-27: 30 mg via INTRAVENOUS
  Administered 2022-08-27 (×2): 20 mg via INTRAVENOUS
  Administered 2022-08-27: 50 mg via INTRAVENOUS

## 2022-08-27 MED ORDER — LIDOCAINE HCL (CARDIAC) PF 100 MG/5ML IV SOSY
PREFILLED_SYRINGE | INTRAVENOUS | Status: DC | PRN
  Administered 2022-08-27: 60 mg via INTRATRACHEAL

## 2022-08-27 MED ORDER — ALBUMIN HUMAN 5 % IV SOLN: INTRAVENOUS | Status: DC | PRN

## 2022-08-27 MED ORDER — AMISULPRIDE (ANTIEMETIC) 5 MG/2ML IV SOLN: 10.0000 mg | Freq: Once | INTRAVENOUS | Status: DC | PRN

## 2022-08-27 MED ORDER — PHENYLEPHRINE HCL-NACL 20-0.9 MG/250ML-% IV SOLN
INTRAVENOUS | Status: DC | PRN
  Administered 2022-08-27: 15 ug/min via INTRAVENOUS

## 2022-08-27 MED ORDER — FENTANYL CITRATE (PF) 100 MCG/2ML IJ SOLN: 100.0000 ug | Freq: Once | INTRAMUSCULAR | Status: AC

## 2022-08-27 MED ORDER — LACTATED RINGERS IV SOLN: INTRAVENOUS | Status: DC

## 2022-08-27 MED ORDER — FENTANYL CITRATE (PF) 100 MCG/2ML IJ SOLN
INTRAMUSCULAR | Status: AC
  Administered 2022-08-27: 100 ug via INTRAVENOUS
  Filled 2022-08-27: qty 2

## 2022-08-27 MED ORDER — PROPOFOL 10 MG/ML IV BOLUS
INTRAVENOUS | Status: DC | PRN
  Administered 2022-08-27: 100 mg via INTRAVENOUS

## 2022-08-27 MED ORDER — CEFAZOLIN SODIUM-DEXTROSE 2-4 GM/100ML-% IV SOLN
2.0000 g | Freq: Three times a day (TID) | INTRAVENOUS | Status: AC
  Administered 2022-08-28 (×2): 2 g via INTRAVENOUS
  Filled 2022-08-27 (×3): qty 100

## 2022-08-27 MED ORDER — DEXAMETHASONE SODIUM PHOSPHATE 10 MG/ML IJ SOLN
INTRAMUSCULAR | Status: DC | PRN
  Administered 2022-08-27: 10 mg via INTRAVENOUS

## 2022-08-27 MED ORDER — ACETAMINOPHEN 10 MG/ML IV SOLN
INTRAVENOUS | Status: AC
  Filled 2022-08-27: qty 100

## 2022-08-27 MED ORDER — ACETAMINOPHEN 10 MG/ML IV SOLN: 1000.0000 mg | Freq: Once | INTRAVENOUS | Status: DC | PRN

## 2022-08-27 MED ORDER — ONDANSETRON HCL 4 MG/2ML IJ SOLN
INTRAMUSCULAR | Status: DC | PRN
  Administered 2022-08-27: 4 mg via INTRAVENOUS

## 2022-08-27 MED ORDER — CHLORHEXIDINE GLUCONATE 0.12 % MT SOLN: 15.0000 mL | Freq: Once | OROMUCOSAL | Status: AC

## 2022-08-27 MED ORDER — FENTANYL CITRATE (PF) 100 MCG/2ML IJ SOLN
25.0000 ug | INTRAMUSCULAR | Status: DC | PRN
  Administered 2022-08-27: 50 ug via INTRAVENOUS

## 2022-08-27 MED ORDER — CHLORHEXIDINE GLUCONATE 0.12 % MT SOLN
OROMUCOSAL | Status: AC
  Administered 2022-08-27: 15 mL via OROMUCOSAL
  Filled 2022-08-27: qty 15

## 2022-08-27 MED ORDER — ACETAMINOPHEN 10 MG/ML IV SOLN
INTRAVENOUS | Status: DC | PRN
  Administered 2022-08-27: 1000 mg via INTRAVENOUS

## 2022-08-27 MED ORDER — 0.9 % SODIUM CHLORIDE (POUR BTL) OPTIME
TOPICAL | Status: DC | PRN
  Administered 2022-08-27 (×2): 1000 mL

## 2022-08-27 MED ORDER — FENTANYL CITRATE (PF) 250 MCG/5ML IJ SOLN
INTRAMUSCULAR | Status: AC
  Filled 2022-08-27: qty 5

## 2022-08-27 MED ORDER — DEXMEDETOMIDINE HCL IN NACL 80 MCG/20ML IV SOLN
INTRAVENOUS | Status: DC | PRN
  Administered 2022-08-27 (×2): 8 ug via BUCCAL

## 2022-08-27 MED ORDER — ORAL CARE MOUTH RINSE: 15.0000 mL | Freq: Once | OROMUCOSAL | Status: AC

## 2022-08-27 SURGICAL SUPPLY — 132 items
ANCH SUT 2 SHRT 1.45 DRLBT (Anchor) ×6 IMPLANT
ANCHOR JUGGERKNOT W/DRL 2/1.45 (Anchor) IMPLANT
APL SKNCLS STERI-STRIP NONHPOA (GAUZE/BANDAGES/DRESSINGS)
BAG COUNTER SPONGE SURGICOUNT (BAG) ×3 IMPLANT
BAG SPNG CNTER NS LX DISP (BAG) ×3
BANDAGE ACE 4 STERILE (GAUZE/BANDAGES/DRESSINGS) IMPLANT
BAR CARBON EXFX 6X150 (EXFIX) IMPLANT
BAR CARBON EXFX 6X200 (EXFIX) IMPLANT
BAR CARBON EXFX 6X250 (EXFIX) IMPLANT
BAR EXFX 150X6 NS LF (EXFIX) ×9
BAR EXFX 200X6 NS LF (EXFIX) ×3
BAR EXFX 65X6 NS LF DISP (EXFIX) ×3
BAR XTRAFIX CF EXFX 6X65 (EXFIX) IMPLANT
BENZOIN TINCTURE PRP APPL 2/3 (GAUZE/BANDAGES/DRESSINGS) IMPLANT
BIT DRILL 2 FAST STEP (BIT) IMPLANT
BIT DRILL 2.0 LNG QUCK RELEASE (BIT) IMPLANT
BIT DRILL 2.2 SS TIBIAL (BIT) IMPLANT
BLADE AVERAGE 25X9 (BLADE) ×3 IMPLANT
BLADE SURG 10 STRL SS (BLADE) IMPLANT
BNDG CMPR 9X4 STRL LF SNTH (GAUZE/BANDAGES/DRESSINGS) ×3
BNDG COHESIVE 4X5 TAN STRL (GAUZE/BANDAGES/DRESSINGS) ×3 IMPLANT
BNDG ELASTIC 2X5.8 VLCR STR LF (GAUZE/BANDAGES/DRESSINGS) IMPLANT
BNDG ELASTIC 3X5.8 VLCR STR LF (GAUZE/BANDAGES/DRESSINGS) ×3 IMPLANT
BNDG ELASTIC 4X5.8 VLCR STR LF (GAUZE/BANDAGES/DRESSINGS) ×3 IMPLANT
BNDG ELASTIC 6X5.8 VLCR STR LF (GAUZE/BANDAGES/DRESSINGS) ×3 IMPLANT
BNDG ESMARK 4X9 LF (GAUZE/BANDAGES/DRESSINGS) ×3 IMPLANT
BNDG GAUZE DERMACEA FLUFF 4 (GAUZE/BANDAGES/DRESSINGS) ×3 IMPLANT
BNDG GZE DERMACEA 4 6PLY (GAUZE/BANDAGES/DRESSINGS) ×3
BRUSH SCRUB EZ PLAIN DRY (MISCELLANEOUS) ×6 IMPLANT
CAST PADDING 2X4YD NS (MISCELLANEOUS) IMPLANT
CAST PADDING STERILE 3X4 (CAST SUPPLIES) IMPLANT
CAST PADDING STERILE 4X4 (CAST SUPPLIES) IMPLANT
CLAMP BAR/PIN TO BAR/PIN SM (EXFIX) IMPLANT
CLAMP EXFX B/P SMALL 2D LCK (CLAMP) IMPLANT
CLAMP PIN TO BAR 6MM EXFIX (EXFIX) IMPLANT
CLEANER TIP ELECTROSURG 2X2 (MISCELLANEOUS) ×3 IMPLANT
COVER SURGICAL LIGHT HANDLE (MISCELLANEOUS) ×6 IMPLANT
DRAPE C-ARM 42X72 X-RAY (DRAPES) ×3 IMPLANT
DRAPE C-ARMOR (DRAPES) ×3 IMPLANT
DRAPE INCISE IOBAN 66X45 STRL (DRAPES) IMPLANT
DRAPE ORTHO SPLIT 77X108 STRL (DRAPES) ×3
DRAPE SURG ORHT 6 SPLT 77X108 (DRAPES) IMPLANT
DRAPE U-SHAPE 47X51 STRL (DRAPES) ×3 IMPLANT
DRILL 2.0 LNG QUICK RELEASE (BIT) ×3
DRSG ADAPTIC 3X8 NADH LF (GAUZE/BANDAGES/DRESSINGS) IMPLANT
DRSG EMULSION OIL 3X3 NADH (GAUZE/BANDAGES/DRESSINGS) ×3 IMPLANT
DRSG MEPILEX POST OP 4X8 (GAUZE/BANDAGES/DRESSINGS) IMPLANT
DRSG MEPITEL 4X7.2 (GAUZE/BANDAGES/DRESSINGS) IMPLANT
ELECT REM PT RETURN 9FT ADLT (ELECTROSURGICAL) ×3
ELECTRODE REM PT RTRN 9FT ADLT (ELECTROSURGICAL) ×3 IMPLANT
GAUZE SPONGE 4X4 12PLY STRL (GAUZE/BANDAGES/DRESSINGS) ×3 IMPLANT
GAUZE XEROFORM 1X8 LF (GAUZE/BANDAGES/DRESSINGS) ×3 IMPLANT
GAUZE XEROFORM 5X9 LF (GAUZE/BANDAGES/DRESSINGS) ×3 IMPLANT
GLOVE BIO SURGEON STRL SZ7.5 (GLOVE) ×3 IMPLANT
GLOVE BIO SURGEON STRL SZ8 (GLOVE) ×3 IMPLANT
GLOVE BIOGEL PI IND STRL 7.5 (GLOVE) ×3 IMPLANT
GLOVE BIOGEL PI IND STRL 8 (GLOVE) ×3 IMPLANT
GLOVE SURG ORTHO LTX SZ7.5 (GLOVE) ×6 IMPLANT
GOWN STRL REUS W/ TWL LRG LVL3 (GOWN DISPOSABLE) ×6 IMPLANT
GOWN STRL REUS W/ TWL XL LVL3 (GOWN DISPOSABLE) ×3 IMPLANT
GOWN STRL REUS W/TWL LRG LVL3 (GOWN DISPOSABLE) ×6
GOWN STRL REUS W/TWL XL LVL3 (GOWN DISPOSABLE) ×3
HEAD RADIAL ARH 20 RT (Head) IMPLANT
K-WIRE .062 (WIRE) ×3
K-WIRE 1.6 (WIRE) ×15
K-WIRE FX5X1.6XNS BN SS (WIRE) ×15
K-WIRE FX6X.062X2 END TROC (WIRE) ×3
KIT BASIN OR (CUSTOM PROCEDURE TRAY) ×3 IMPLANT
KIT TURNOVER KIT B (KITS) ×3 IMPLANT
KWIRE FX5X1.6XNS BN SS (WIRE) IMPLANT
KWIRE FX6X.062X2 END TROC (WIRE) IMPLANT
NDL 1/2 CIR CATGUT .05X1.09 (NEEDLE) ×3 IMPLANT
NDL HYPO 25GX1X1/2 BEV (NEEDLE) IMPLANT
NEEDLE 1/2 CIR CATGUT .05X1.09 (NEEDLE) IMPLANT
NEEDLE HYPO 25GX1X1/2 BEV (NEEDLE) IMPLANT
NS IRRIG 1000ML POUR BTL (IV SOLUTION) ×3 IMPLANT
PACK ORTHO EXTREMITY (CUSTOM PROCEDURE TRAY) ×3 IMPLANT
PAD ARMBOARD 7.5X6 YLW CONV (MISCELLANEOUS) ×6 IMPLANT
PAD CAST 3X4 CTTN HI CHSV (CAST SUPPLIES) ×3 IMPLANT
PAD CAST 4YDX4 CTTN HI CHSV (CAST SUPPLIES) ×3 IMPLANT
PADDING CAST COTTON 3X4 STRL (CAST SUPPLIES) ×3
PADDING CAST COTTON 4X4 STRL (CAST SUPPLIES)
PEG FULLY THREADED 2.5X22MM (Peg) IMPLANT
PEG LOCKING SMOOTH 2.2X18 (Peg) IMPLANT
PEG LOCKING SMOOTH 2.2X20 (Screw) IMPLANT
PEG LOCKING SMOOTH 2.2X22 (Screw) IMPLANT
PIN 4MMX120X25 (EXFIX) IMPLANT
PIN CLAMP 1BAR 33MM EXFIX (EXFIX) IMPLANT
PIN CLAMP 2BAR 33MM EXFIX (EXFIX) IMPLANT
PIN HALF 3.0MM 120X25MM EXFIX (EXFIX) IMPLANT
PLATE DVR RADIUS L CROSSLOCK (Plate) IMPLANT
PLATE LOCKING 2.5 STRAIGHT (Plate) IMPLANT
PLATE STANDARD DVR LEFT (Plate) ×3 IMPLANT
PLATE STD DVR LT 24X51 (Plate) IMPLANT
RETRIEVER SUT HEWSON (MISCELLANEOUS) IMPLANT
SCREW BN 14X2.7XNONLOCK 3 LD (Screw) IMPLANT
SCREW LOCK 14X2.7X 3 LD TPR (Screw) IMPLANT
SCREW LOCK 16X2.7X 3 LD TPR (Screw) IMPLANT
SCREW LOCKING 2.7X14 (Screw) ×6 IMPLANT
SCREW LOCKING 2.7X16 (Screw) ×6 IMPLANT
SCREW NLOCK 2.7X14 (Screw) ×3 IMPLANT
SCREW PEG 2.5X14 NONLOCK (Screw) IMPLANT
SCREW PEG LOCK 2.5X12 (Screw) IMPLANT
SCREW PEG LOCK 2.5X14 (Peg) IMPLANT
SCREW PEG LOCK 2.5X15 (Orthopedic Implant) IMPLANT
SCREW PEG LOCK 2.5X18 (Peg) IMPLANT
SPLINT PLASTER CAST XFAST 5X30 (CAST SUPPLIES) ×3 IMPLANT
SPLINT PLASTER GYPS XFAST 3X15 (CAST SUPPLIES) IMPLANT
SPONGE T-LAP 18X18 ~~LOC~~+RFID (SPONGE) ×6 IMPLANT
STAPLER VISISTAT 35W (STAPLE) ×3 IMPLANT
STEM MORSE LONG STEM 9 (Stem) IMPLANT
STRIP CLOSURE SKIN 1/2X4 (GAUZE/BANDAGES/DRESSINGS) IMPLANT
SUCTION FRAZIER HANDLE 10FR (MISCELLANEOUS)
SUCTION TUBE FRAZIER 10FR DISP (MISCELLANEOUS) ×3 IMPLANT
SUT ETHILON 3 0 FSL (SUTURE) IMPLANT
SUT FIBERWIRE #2 38 T-5 BLUE (SUTURE) ×12
SUT PDS AB 2-0 CT1 27 (SUTURE) IMPLANT
SUT PROLENE 3 0 PS 2 (SUTURE) ×6 IMPLANT
SUT VIC AB 0 CT1 27 (SUTURE) ×6
SUT VIC AB 0 CT1 27XBRD ANBCTR (SUTURE) ×6 IMPLANT
SUT VIC AB 1 CT1 36 (SUTURE) IMPLANT
SUT VIC AB 2-0 CT1 27 (SUTURE) ×12
SUT VIC AB 2-0 CT1 TAPERPNT 27 (SUTURE) ×6 IMPLANT
SUT VIC AB 2-0 CT3 27 (SUTURE) IMPLANT
SUTURE FIBERWR #2 38 T-5 BLUE (SUTURE) IMPLANT
SYR CONTROL 10ML LL (SYRINGE) ×3 IMPLANT
TOWEL GREEN STERILE (TOWEL DISPOSABLE) ×6 IMPLANT
TOWEL GREEN STERILE FF (TOWEL DISPOSABLE) ×3 IMPLANT
TUBE CONNECTING 12X1/4 (SUCTIONS) ×3 IMPLANT
UNDERPAD 30X36 HEAVY ABSORB (UNDERPADS AND DIAPERS) ×3 IMPLANT
WATER STERILE IRR 1000ML POUR (IV SOLUTION) ×3 IMPLANT
YANKAUER SUCT BULB TIP NO VENT (SUCTIONS) ×3 IMPLANT

## 2022-08-27 NOTE — Progress Notes (Signed)
PT Cancellation Note  Patient Details Name: Vanessa [name] MRN: [id number] DOB: Aug 06, 1951   Cancelled Treatment:    Reason Eval/Treat Not Completed: Other (comment). Pt currently sleeping and daughter requesting PT let her sleep as she was disoriented and agitated earlier today. Pt plans to have surgery this afternoon, thus will likely follow-up tomorrow.   Raymond Gurney, PT, DPT Acute Rehabilitation Services  Office: [phone]    [name] 08/27/2022, 11:06 AM

## 2022-08-27 NOTE — Progress Notes (Signed)
I discussed with the patient the risks and benefits of surgery for her right distal humerus and radial head fracture dislocation as well as left sided distal radius and ulna repairs, including the possibility of infection, nerve injury, vessel injury, wound breakdown, arthritis, symptomatic hardware, DVT/ PE, loss of motion, malunion, nonunion, and need for further surgery among others.  We also specifically discussed the need for subsequent treatment of her should by another surgeon Dr. Aundria Rud with arthroplasty. They acknowledged these risks and provided consent to proceed.  Myrene Galas, MD [facility], [facility] [phone]

## 2022-08-27 NOTE — Progress Notes (Signed)
Pt is off the unit to the OR.  

## 2022-08-27 NOTE — Transfer of Care (Signed)
Immediate Anesthesia Transfer of Care Note  Patient: Vanessa [name]  Procedure(s) Performed: RADIAL HEAD ARTHROPLASTY (Right) ELBOW LIGAMENT RECONSTRUCTION (Right: Elbow) OPEN REDUCTION INTERNAL FIXATION (ORIF) DISTAL RADIUS FRACTURE (Left) OPEN REDUCTION INTERNAL FIXATION (ORIF) ULNAR FRACTURE (Left)  Patient Location: PACU  Anesthesia Type:General  Level of Consciousness: drowsy  Airway & Oxygen Therapy: Patient Spontanous Breathing and Patient connected to face mask oxygen  Post-op Assessment: Report given to RN and Post -op Vital signs reviewed and stable  Post vital signs: Reviewed and stable  Last Vitals:  Vitals Value Taken Time  BP 178/66 08/27/22 2055  Temp    Pulse 104 08/27/22 2100  Resp 19 08/27/22 2100  SpO2 94 % 08/27/22 2100  Vitals shown include unvalidated device data.  Last Pain:  Vitals:   08/27/22 1325  TempSrc:   PainSc: 0-No pain         Complications: No notable events documented.

## 2022-08-27 NOTE — Progress Notes (Signed)
Progress Note  Day of Surgery  Subjective: No new complaints. NPO for OR with ortho but had been eating well. Has a headache that was improved with pain medications  Daughter is bedside  Objective: Vital signs in last 24 hours: Temp:  [98.1 F (36.7 C)-98.6 F (37 C)] 98.1 F (36.7 C) (09/14 1132) Pulse Rate:  [76-107] 93 (09/14 1132) Resp:  [12-19] 15 (09/14 1132) BP: (106-155)/(60-90) 144/68 (09/14 1132) SpO2:  [93 %-100 %] 100 % (09/14 1132) Last BM Date : 08/24/22  Intake/Output from previous day: 09/13 0701 - 09/14 0700 In: 1392.4 [I.V.:1292; IV Piggyback:100.4] Out: -  Intake/Output this shift: Total I/O In: -  Out: 600 [Urine:600]  PE: General: pleasant, WD, female who is laying in bed in NAD HEENT: left periorbital bruising. Mouth is pink and moist Heart: regular, rate, and rhythm.   Lungs:  Respiratory effort nonlabored on room air Abd: soft, NT, ND, +BS, no masses, hernias, or organomegaly MSK: RUE in splint and sling - hand WWP. LUE in splint - hand WWP Skin: warm and dry with no masses, lesions, or rashes Neuro: Cranial nerves 2-12 grossly intact, sensation is normal throughout Psych: A&Ox3 with an appropriate affect.    Lab Results:  Recent Labs    08/26/22 0303 08/27/22 0148  WBC 7.2 6.8  HGB 11.6* 9.8*  HCT 34.1* 29.4*  PLT 147* 109*   BMET Recent Labs    08/25/22 0506 08/25/22 0508 08/26/22 0216  NA 140 142 138  K 4.0 4.0 3.8  CL 110 107 109  CO2 21*  --  22  GLUCOSE 157* 159* 134*  BUN 18 20 16   CREATININE 0.80 0.70 0.71  CALCIUM 9.1  --  8.5*   PT/INR Recent Labs    08/25/22 0506  LABPROT 13.0  INR 1.0   CMP     Component Value Date/Time   NA 138 08/26/2022 0216   K 3.8 08/26/2022 0216   CL 109 08/26/2022 0216   CO2 22 08/26/2022 0216   GLUCOSE 134 (H) 08/26/2022 0216   BUN 16 08/26/2022 0216   CREATININE 0.71 08/26/2022 0216   CREATININE 0.67 01/13/2016 0833   CALCIUM 8.5 (L) 08/26/2022 0216   PROT 6.2 (L)  08/25/2022 0506   ALBUMIN 3.5 08/25/2022 0506   AST 26 08/25/2022 0506   ALT 23 08/25/2022 0506   ALKPHOS 67 08/25/2022 0506   BILITOT 0.8 08/25/2022 0506   GFRNONAA >60 08/26/2022 0216   GFRNONAA >89 08/09/2015 0833   GFRAA >89 08/09/2015 0833   Lipase  No results found for: "LIPASE"     Studies/Results: CT SHOULDER RIGHT WO CONTRAST  Result Date: 08/25/2022 CLINICAL DATA:  Found down. Proximal right humeral and right scapular fracture. EXAM: CT OF THE UPPER RIGHT EXTREMITY WITHOUT CONTRAST TECHNIQUE: Multidetector CT imaging of the right shoulder was performed according to the standard protocol. RADIATION DOSE REDUCTION: This exam was performed according to the departmental dose-optimization program which includes automated exposure control, adjustment of the mA and/or kV according to patient size and/or use of iterative reconstruction technique. COMPARISON:  Radiographs and chest CT earlier the same date. FINDINGS: Bones/Joint/Cartilage Again demonstrated is an extensively comminuted and displaced fracture of the right humeral neck which involves the articular surface of the humeral head and both tuberosities. This fracture is impacted with anterior displacement and apex anterior angulation. The humeral head remains located. There is a mildly displaced fracture of the anterior inferior glenoid. In addition, there is a mildly displaced fracture  involving the coracoid process. The remainder of the right scapula appears intact. The right clavicle and acromioclavicular joint are intact. Moderate size shoulder joint effusion. Ligaments Suboptimally assessed by CT. Muscles and Tendons Soft tissue swelling within the deltoid musculature without focal fluid collection or foreign body. Soft tissues Soft tissue swelling around the shoulder with increased involvement of the right axilla compared with earlier CT. No high-density components to suggest active bleeding. No evidence of foreign body or soft  tissue emphysema. IMPRESSION: 1. Extensively comminuted, displaced and angulated fracture of the right humeral head and neck as described. 2. Fractures of the anterior inferior glenoid rim and coracoid process. 3. Shoulder joint effusion with diffuse soft tissue swelling around the shoulder extending into the right axilla. Electronically Signed   By: Carey Bullocks M.D.   On: 08/25/2022 17:21   CT ELBOW RIGHT WO CONTRAST  Result Date: 08/25/2022 CLINICAL DATA:  Found down. Elbow trauma with fracture dislocation, postreduction. EXAM: CT OF THE UPPER RIGHT EXTREMITY WITHOUT CONTRAST 3-DIMENSIONAL CT IMAGE RENDERING ON ACQUISITION WORKSTATION TECHNIQUE: Multidetector CT imaging of the right elbow was performed according to the standard protocol. 3-dimensional CT images were rendered by post-processing of the original CT data on an acquisition workstation. The 3-dimensional CT images were interpreted and findings were reported in the accompanying complete CT report for this study RADIATION DOSE REDUCTION: This exam was performed according to the departmental dose-optimization program which includes automated exposure control, adjustment of the mA and/or kV according to patient size and/or use of iterative reconstruction technique. COMPARISON:  Radiographs same date. FINDINGS: Bones/Joint/Cartilage Examination was performed with the forearm over the patient's abdomen, limiting bone detail. The elbow and forearm are splinted. The ulnohumeral dislocation has been reduced. There is a small fracture of the coronoid process of the proximal ulna. There are mildly displaced fractures of the lateral humeral epicondyle. As seen on earlier radiographs, there is an extensively comminuted and displaced fracture of the radial head and neck. The radial head is rotated and no longer articulates with the capitellum. No definite fracture of the articular surface of the capitellum identified. Moderate-sized elbow joint effusion with  intra-articular fracture fragments. Limited imaging of the distal forearm demonstrates no other acute osseous abnormalities. Ligaments Suboptimally assessed by CT. Muscles and Tendons No intramuscular fluid collection identified. The biceps tendon is not well visualized. Soft tissues Soft tissue swelling around the elbow without evidence of foreign body, soft tissue emphysema or focal fluid collection. IMPRESSION: 1. Detail limited by positioning and body habitus. 2. Reduction of the ulnohumeral dislocation with small fracture of the coronoid process. 3. Extensively comminuted fracture of the radial head and neck with persistent dislocation of the radial head. Mildly comminuted fracture of the lateral humeral epicondyle. 4. Surrounding soft tissue swelling and joint effusion without evidence of foreign body. Electronically Signed   By: Carey Bullocks M.D.   On: 08/25/2022 17:12   CT 3D RECON AT SCANNER  Result Date: 08/25/2022 CLINICAL DATA:  Found down. Elbow trauma with fracture dislocation, postreduction. EXAM: CT OF THE UPPER RIGHT EXTREMITY WITHOUT CONTRAST 3-DIMENSIONAL CT IMAGE RENDERING ON ACQUISITION WORKSTATION TECHNIQUE: Multidetector CT imaging of the right elbow was performed according to the standard protocol. 3-dimensional CT images were rendered by post-processing of the original CT data on an acquisition workstation. The 3-dimensional CT images were interpreted and findings were reported in the accompanying complete CT report for this study RADIATION DOSE REDUCTION: This exam was performed according to the departmental dose-optimization program which includes automated  exposure control, adjustment of the mA and/or kV according to patient size and/or use of iterative reconstruction technique. COMPARISON:  Radiographs same date. FINDINGS: Bones/Joint/Cartilage Examination was performed with the forearm over the patient's abdomen, limiting bone detail. The elbow and forearm are splinted. The  ulnohumeral dislocation has been reduced. There is a small fracture of the coronoid process of the proximal ulna. There are mildly displaced fractures of the lateral humeral epicondyle. As seen on earlier radiographs, there is an extensively comminuted and displaced fracture of the radial head and neck. The radial head is rotated and no longer articulates with the capitellum. No definite fracture of the articular surface of the capitellum identified. Moderate-sized elbow joint effusion with intra-articular fracture fragments. Limited imaging of the distal forearm demonstrates no other acute osseous abnormalities. Ligaments Suboptimally assessed by CT. Muscles and Tendons No intramuscular fluid collection identified. The biceps tendon is not well visualized. Soft tissues Soft tissue swelling around the elbow without evidence of foreign body, soft tissue emphysema or focal fluid collection. IMPRESSION: 1. Detail limited by positioning and body habitus. 2. Reduction of the ulnohumeral dislocation with small fracture of the coronoid process. 3. Extensively comminuted fracture of the radial head and neck with persistent dislocation of the radial head. Mildly comminuted fracture of the lateral humeral epicondyle. 4. Surrounding soft tissue swelling and joint effusion without evidence of foreign body. Electronically Signed   By: Carey Bullocks M.D.   On: 08/25/2022 17:12   CT HEAD WO CONTRAST ( )  Result Date: 08/25/2022 CLINICAL DATA:  Head trauma, moderate-severe.  Fall down steps. EXAM: CT HEAD WITHOUT CONTRAST TECHNIQUE: Contiguous axial images were obtained from the base of the skull through the vertex without intravenous contrast. RADIATION DOSE REDUCTION: This exam was performed according to the departmental dose-optimization program which includes automated exposure control, adjustment of the mA and/or kV according to patient size and/or use of iterative reconstruction technique. COMPARISON:  08/25/2022  FINDINGS: Brain: Previously seen subdural hemorrhage along the posterior falx no longer visualized. Previously seen bilateral subarachnoid hemorrhage no longer visualized. No new hemorrhage. No hydrocephalus or acute infarction. Vascular: No hyperdense vessel or unexpected calcification. Skull: No acute calvarial abnormality. Sinuses/Orbits: No acute findings Other: None IMPRESSION: Areas of subdural and subarachnoid hemorrhage previously noted no longer visualized. No acute intracranial abnormality currently. Electronically Signed   By: Charlett Nose M.D.   On: 08/25/2022 17:05   DG Elbow 2 Views Right  Result Date: 08/25/2022 CLINICAL DATA:  Postreduction right elbow EXAM: RIGHT ELBOW - 2 VIEW COMPARISON:  Right elbow radiographs 08/25/2022; earlier right humerus and forearm radiographs 08/25/2022 FINDINGS: There is new overlying cast material. Redemonstration of comminuted fracture of the distal lateral humerus and markedly comminuted fracture of the proximal radius. There is improved, grossly normal alignment of the previously dislocated humerus-radius and humerus-ulnar articulations. Mild volar displacement and angulation of the fractured radial head on lateral view. On frontal view there is mild obliquity but grossly normal radiocapitellar alignment, within the limitation of the bone loss from the comminuted proximal radial fracture. IMPRESSION: 1. Improved gross alignment of the elbow with resolution of the prior complete dislocation. 2. Improved, grossly normal alignment of the distal humerus with respect to the proximal ulna and radius, however the markedly comminuted proximal radial head still has some mildly displaced components. Electronically Signed   By: Neita Garnet M.D.   On: 08/25/2022 12:45   DG Wrist Complete Left  Result Date: 08/25/2022 CLINICAL DATA:  Postreduction of left wrist EXAM: LEFT  WRIST - COMPLETE 3+ VIEW COMPARISON:  Left wrist radiographs 08/25/2022 at 10:02 a.m. and 6:26  a.m. FINDINGS: There is new overlying casting material that limits evaluation of fine bony detail. Redemonstration of distal radial metaphyseal through the articular surface fracture lines with improved, now 6 mm lateral displacement of the distal fracture component with respect to the proximal fracture component (previously 10 mm). There is improved now appropriate AP alignment of the fracture fragments on lateral view. There is improved alignment of the distal ulnar comminuted, acute, diaphyseal fracture with now approximately 2 mm lateral displacement but resolution of the prior AP displacement of the distal fracture component. IMPRESSION: 1. New overlying casting material. 2. Improved alignment of the distal radial fracture with persistent 6 mm lateral displacement of the distal fracture component. 3. Improved alignment of the distal ulnar comminuted diaphyseal fracture with mild-to-moderate lateral displacement of the distal fracture component. Electronically Signed   By: Neita Garnet M.D.   On: 08/25/2022 12:41    Anti-infectives: Anti-infectives (From admission, onward)    Start     Dose/Rate Route Frequency Ordered Stop   08/27/22 1300  ceFAZolin (ANCEF) IVPB 2g/100 mL premix        2 g 200 mL/hr over 30 Minutes Intravenous On call to O.R. 08/26/22 1111 08/28/22 0559        Assessment/Plan Fall down stairs   SDH, SAH - NSGY c/s, keppra for sz ppx, repeat CT negative. SLP eval pending R prox humerus fx - ortho evaluated and recommend total arthoplasty with Dr. Aundria Rud to evaluate R elbow fx/disloc, L wrist fx - plan for OR today ABLA - hgb 9.8. monitor FEN - NPO for OR. Okay for reg diet after DVT - SCDs, LMWH  I reviewed Consultant orthopedic notes, last 24 h vitals and pain scores, last 48 h intake and output, last 24 h labs and trends, and last 24 h imaging results.    LOS: 2 days   Eric Form, [facility] 08/27/2022, 12:00 PM Please see Amion for  pager number during day hours 7:00am-4:30pm

## 2022-08-27 NOTE — Anesthesia Postprocedure Evaluation (Signed)
Anesthesia Post Note  Patient: Vanessa [name]  Procedure(s) Performed: RADIAL HEAD ARTHROPLASTY (Right) ELBOW LIGAMENT RECONSTRUCTION (Right: Elbow) OPEN REDUCTION INTERNAL FIXATION (ORIF) DISTAL RADIUS FRACTURE (Left) OPEN REDUCTION INTERNAL FIXATION (ORIF) ULNAR FRACTURE (Left)     Patient location during evaluation: PACU Anesthesia Type: General Level of consciousness: awake and alert Pain management: pain level controlled Vital Signs Assessment: post-procedure vital signs reviewed and stable Respiratory status: spontaneous breathing, nonlabored ventilation, respiratory function stable and patient connected to nasal cannula oxygen Cardiovascular status: blood pressure returned to baseline and stable Postop Assessment: no apparent nausea or vomiting Anesthetic complications: no   No notable events documented.  Last Vitals:  Vitals:   08/27/22 2125 08/27/22 2140  BP: (!) 167/82 (!) 167/82  Pulse: (!) 102 (!) 106  Resp: 10 14  Temp:  36.7 C  SpO2: 93% 99%    Last Pain:  Vitals:   08/27/22 2125  TempSrc:   PainSc: 5                  Shelton Silvas

## 2022-08-27 NOTE — Progress Notes (Signed)
SLP Cancellation Note  Patient Details Name: CLEMENTINE SOULLIERE MRN: [id number] DOB: 10/05/1951   Cancelled treatment:       Reason Eval/Treat Not Completed: Patient at procedure or test/unavailable Ferdinand Lango MA, CCC-SLP   Azora Bonzo Meryl 08/27/2022, 1:43 PM

## 2022-08-27 NOTE — Anesthesia Procedure Notes (Signed)
Central Venous Catheter Insertion Performed by: Leonides Grills, MD, anesthesiologist Start/End9/14/2023 2:15 PM, 08/27/2022 2:30 PM Patient location: Pre-op. Preanesthetic checklist: patient identified, IV checked, site marked, risks and benefits discussed, surgical consent, monitors and equipment checked, pre-op evaluation, timeout performed and anesthesia consent Position: Trendelenburg Lidocaine 1% used for infiltration and patient sedated Hand hygiene performed , maximum sterile barriers used  and Seldinger technique used Catheter size: 8 Fr Total catheter length 16. Central line was placed.Double lumen Procedure performed using ultrasound guided technique. Ultrasound Notes:anatomy identified, needle tip was noted to be adjacent to the nerve/plexus identified, no ultrasound evidence of intravascular and/or intraneural injection and image(s) printed for medical record Attempts: 1 Following insertion, dressing applied, line sutured and Biopatch. Post procedure assessment: blood return through all ports  Patient tolerated the procedure well with no immediate complications.

## 2022-08-27 NOTE — Progress Notes (Signed)
OT Cancellation Note  Patient Details Name: Vanessa [name] MRN: [id number] DOB: 02-10-1951   Cancelled Treatment:    Reason Eval/Treat Not Completed: Other (comment) (Scheduled for surgery today. Will follow up tomorrow as appropriate.)  [facility] 08/27/2022, 7:53 AM Luisa Dago, OT/L   Acute OT Clinical Specialist Acute Rehabilitation Services Pager [phone] Office [phone]

## 2022-08-27 NOTE — Progress Notes (Deleted)
1423- Pt admitted to 4NP-12 from PACU. Pt responds to voice, A&O x 4. Complains of 10/10 pain to left leg/arm pt repositioned and icepacks placed to leg and arm. Assessment and VS documented. Pt oriented to unit. CB within reach, bed in lowest position, bed alarm on.

## 2022-08-27 NOTE — Anesthesia Preprocedure Evaluation (Addendum)
Anesthesia Evaluation  Patient identified by MRN, date of birth, ID band Patient awake    Reviewed: Allergy & Precautions, NPO status , Patient's Chart, lab work & pertinent test results  Airway Mallampati: III  TM Distance: >3 FB Neck ROM: Full    Dental no notable dental hx.    Pulmonary neg pulmonary ROS   Pulmonary exam normal        Cardiovascular hypertension, Pt. on home beta blockers Normal cardiovascular exam     Neuro/Psych  PSYCHIATRIC DISORDERS Anxiety Depression    SDH SAH      GI/Hepatic negative GI ROS, Neg liver ROS,,,  Endo/Other  Hypothyroidism    Renal/GU negative Renal ROS     Musculoskeletal  (+) Arthritis ,    Abdominal  (+) + obese  Peds  Hematology  (+) Blood dyscrasia, anemia   Anesthesia Other Findings right proximal humerus fracture, right radial head fracture with elbow dislocation, left distal radius fracture  Reproductive/Obstetrics                             Anesthesia Physical Anesthesia Plan  ASA: 3  Anesthesia Plan: General   Post-op Pain Management:    Induction: Intravenous  PONV Risk Score and Plan: 3 and Ondansetron, Dexamethasone, Midazolam and Treatment may vary due to age or medical condition  Airway Management Planned: Oral ETT  Additional Equipment: CVP and Ultrasound Guidance Line Placement  Intra-op Plan:   Post-operative Plan: Extubation in OR  Informed Consent: I have reviewed the patients History and Physical, chart, labs and discussed the procedure including the risks, benefits and alternatives for the proposed anesthesia with the patient or authorized representative who has indicated his/her understanding and acceptance.     Dental advisory given  Plan Discussed with: CRNA and Surgeon  Anesthesia Plan Comments:        Anesthesia Quick Evaluation

## 2022-08-28 ENCOUNTER — Inpatient Hospital Stay: Payer: Self-pay

## 2022-08-28 ENCOUNTER — Inpatient Hospital Stay (HOSPITAL_COMMUNITY): Payer: Medicare Other

## 2022-08-28 LAB — CBC
HCT: 24.8 % — ABNORMAL LOW (ref 36.0–46.0)
Hemoglobin: 8.4 g/dL — ABNORMAL LOW (ref 12.0–15.0)
MCH: 32.1 pg (ref 26.0–34.0)
MCHC: 33.9 g/dL (ref 30.0–36.0)
MCV: 94.7 fL (ref 80.0–100.0)
Platelets: 141 10*3/uL — ABNORMAL LOW (ref 150–400)
RBC: 2.62 MIL/uL — ABNORMAL LOW (ref 3.87–5.11)
RDW: 13.2 % (ref 11.5–15.5)
WBC: 7 10*3/uL (ref 4.0–10.5)
nRBC: 0 % (ref 0.0–0.2)

## 2022-08-28 LAB — VITAMIN D 25 HYDROXY (VIT D DEFICIENCY, FRACTURES): Vit D, 25-Hydroxy: 30.91 ng/mL (ref 30–100)

## 2022-08-28 MED ORDER — MELATONIN 5 MG PO TABS
5.0000 mg | ORAL_TABLET | Freq: Every evening | ORAL | Status: DC | PRN
  Administered 2022-08-28 – 2022-09-02 (×5): 5 mg via ORAL
  Filled 2022-08-28 (×5): qty 1

## 2022-08-28 MED ORDER — POLYETHYLENE GLYCOL 3350 17 G PO PACK
17.0000 g | PACK | Freq: Every day | ORAL | Status: DC
  Administered 2022-08-28 – 2022-09-03 (×6): 17 g via ORAL
  Filled 2022-08-28 (×7): qty 1

## 2022-08-28 MED ORDER — LEVETIRACETAM 500 MG PO TABS
500.0000 mg | ORAL_TABLET | Freq: Two times a day (BID) | ORAL | Status: AC
  Administered 2022-08-28 – 2022-08-31 (×7): 500 mg via ORAL
  Filled 2022-08-28 (×7): qty 1

## 2022-08-28 MED ORDER — HYDROMORPHONE HCL 1 MG/ML IJ SOLN
0.5000 mg | INTRAMUSCULAR | Status: DC | PRN
  Administered 2022-09-01 – 2022-09-02 (×2): 0.5 mg via INTRAVENOUS
  Filled 2022-08-28 (×2): qty 0.5

## 2022-08-28 MED ORDER — SODIUM CHLORIDE 0.9% FLUSH: 10.0000 mL | INTRAVENOUS | Status: DC | PRN

## 2022-08-28 MED ORDER — SODIUM CHLORIDE 0.9% FLUSH
10.0000 mL | Freq: Two times a day (BID) | INTRAVENOUS | Status: DC
  Administered 2022-08-28 – 2022-09-02 (×11): 10 mL
  Administered 2022-09-03: 30 mL

## 2022-08-28 NOTE — Care Management Important Message (Signed)
Important Message  Patient Details  Name: Vanessa [name] MRN: [id number] Date of Birth: 12/13/1951   Medicare Important Message Given:  Yes     Sherilyn Banker 08/28/2022, 12:08 PM

## 2022-08-28 NOTE — Progress Notes (Signed)
Per order IJ from R side of neck removed. Pt tolerated well. Pressure held to site x 5 mins no signs of bleeding noted. Gauze dressing placed.

## 2022-08-28 NOTE — Progress Notes (Signed)
[facility] Progress Note  1 Day Post-Op  Subjective: CC-  Daughter at bedside.  Feeling sleepy this morning, she was given ambien last night. Continues to have a lot of pain in the upper extremities. Pain medication does help. She did not get to get OOB yesterday. Tolerating diet. No BM since admission.  Objective: Vital signs in last 24 hours: Temp:  [97.9 F (36.6 C)-98.9 F (37.2 C)] 98.7 F (37.1 C) (09/15 0733) Pulse Rate:  [90-118] 108 (09/15 0733) Resp:  [10-24] 19 (09/15 0733) BP: (135-192)/(62-89) 135/62 (09/15 0733) SpO2:  [93 %-100 %] 95 % (09/15 0733) Weight:  [90.7 kg] 90.7 kg (09/14 1316) Last BM Date : 08/24/22  Intake/Output from previous day: 09/14 0701 - 09/15 0700 In: 2550 [I.V.:2000; IV Piggyback:550] Out: 2400 [Urine:2250; Blood:150] Intake/Output this shift: No intake/output data recorded.  PE: Gen:  Alert, NAD, pleasant HEENT: left periorbital bruising Card:  RRR, palpable pedal pulses bilaterally, fingers WWP bilaterally Pulm:  CTAB, no W/R/R, rate and effort normal on room air Abd: Soft, NT/ND, +BS BLE:  calves soft and nontender without edema BUE: RUE in splint and ex fix - hand WWP, fingers mildly edematous. LUE in splint - hand WWP, fingers mildly edematous Psych: A&Ox4 Skin: no rashes noted, warm and dry  Lab Results:  Recent Labs    08/27/22 0148 08/28/22 0600  WBC 6.8 7.0  HGB 9.8* 8.4*  HCT 29.4* 24.8*  PLT 109* 141*   BMET Recent Labs    08/26/22 0216  NA 138  K 3.8  CL 109  CO2 22  GLUCOSE 134*  BUN 16  CREATININE 0.71  CALCIUM 8.5*   PT/INR No results for input(s): "LABPROT", "INR" in the last 72 hours. CMP     Component Value Date/Time   NA 138 08/26/2022 0216   K 3.8 08/26/2022 0216   CL 109 08/26/2022 0216   CO2 22 08/26/2022 0216   GLUCOSE 134 (H) 08/26/2022 0216   BUN 16 08/26/2022 0216   CREATININE 0.71 08/26/2022 0216   CREATININE 0.67 01/13/2016 0833   CALCIUM 8.5 (L) 08/26/2022 0216    PROT 6.2 (L) 08/25/2022 0506   ALBUMIN 3.5 08/25/2022 0506   AST 26 08/25/2022 0506   ALT 23 08/25/2022 0506   ALKPHOS 67 08/25/2022 0506   BILITOT 0.8 08/25/2022 0506   GFRNONAA >60 08/26/2022 0216   GFRNONAA >89 08/09/2015 0833   GFRAA >89 08/09/2015 0833   Lipase  No results found for: "LIPASE"     Studies/Results: [place] EKG SITE RITE  Result Date: 08/28/2022 If Site Rite image not attached, placement could not be confirmed due to current cardiac rhythm.  DG Elbow 2 Views Right  Result Date: 08/27/2022 CLINICAL DATA:  Fracture. EXAM: RIGHT ELBOW - 2 VIEW COMPARISON:  Right elbow x-ray 08/25/2022 FINDINGS: There is a new right radial head arthroplasty. Alignment is anatomic. No acute fracture. There is soft tissue swelling surrounding the elbow. External fixator device present with 2 screws in the distal humerus and 2 screws in the mid ulna. IMPRESSION: 1. New radial head arthroplasty and external fixation device present. Alignment is anatomic. Electronically Signed   By: Darliss Cheney M.D.   On: 08/27/2022 23:19   DG Wrist Complete Left  Result Date: 08/27/2022 CLINICAL DATA:  Fracture EXAM: LEFT WRIST - COMPLETE 3+ VIEW COMPARISON:  Right wrist x-ray 08/25/2022 FINDINGS: There is a new ventral sideplate and multiple screws fixating a comminuted distal radius fracture. Alignment is anatomic. There is a  new sideplate and screws fixating a distal ulnar diaphyseal fracture. Alignment is anatomic. No dislocation. There is soft tissue swelling surrounding the fractures. IMPRESSION: 1. ORIF distal radial and distal ulnar fractures. Alignment is anatomic. Electronically Signed   By: Darliss Cheney M.D.   On: 08/27/2022 23:16   DG ELBOW COMPLETE RIGHT (3+VIEW)  Result Date: 08/27/2022 CLINICAL DATA:  ORIF proximal radial fracture. EXAM: RIGHT ELBOW - COMPLETE 3+ VIEW COMPARISON:  August 25, 2022 FINDINGS: Intraoperative fluoroscopic images demonstrate placement of radial head prosthesis  replacing the previously demonstrated comminuted radial head fracture. Near anatomic alignment. The humero-ulnar joint appears widened. IMPRESSION: 1. Intraoperative fluoroscopic images from radial head prosthesis placement with near anatomic alignment. 2. Apparent widening of the humero-ulnar joint. Electronically Signed   By: Ted Mcalpine M.D.   On: 08/27/2022 20:40   DG Forearm Left  Result Date: 08/27/2022 CLINICAL DATA:  Open reduction internal fixation distal radius fracture EXAM: LEFT FOREARM - 2 VIEW FLUOROSCOPY TIME:  Fluoroscopy Time:  1 minute 46 seconds Radiation Exposure Index (if provided by the fluoroscopic device): 5.42 mGy Number of Acquired Spot Images: 4 COMPARISON:  08/25/2022 FINDINGS: Multiple intraoperative fluoroscopic spot images are provided without a radiologist present. Plate and screw fixation of the distal left radius fracture. Redemonstrated angulated fracture of the distal left ulna. IMPRESSION: Intraoperative images during ORIF distal left radius fracture. Electronically Signed   By: Minerva Fester M.D.   On: 08/27/2022 20:39   DG C-Arm 1-60 Min-No Report  Result Date: 08/27/2022 Fluoroscopy was utilized by the requesting physician.  No radiographic interpretation.   DG C-Arm 1-60 Min-No Report  Result Date: 08/27/2022 Fluoroscopy was utilized by the requesting physician.  No radiographic interpretation.   DG C-Arm 1-60 Min-No Report  Result Date: 08/27/2022 Fluoroscopy was utilized by the requesting physician.  No radiographic interpretation.   DG C-Arm 1-60 Min-No Report  Result Date: 08/27/2022 Fluoroscopy was utilized by the requesting physician.  No radiographic interpretation.   DG C-Arm 1-60 Min-No Report  Result Date: 08/27/2022 Fluoroscopy was utilized by the requesting physician.  No radiographic interpretation.   DG C-Arm 1-60 Min-No Report  Result Date: 08/27/2022 Fluoroscopy was utilized by the requesting physician.  No radiographic  interpretation.    Anti-infectives: Anti-infectives (From admission, onward)    Start     Dose/Rate Route Frequency Ordered Stop   08/27/22 2300  ceFAZolin (ANCEF) IVPB 2g/100 mL premix        2 g 200 mL/hr over 30 Minutes Intravenous Every 8 hours 08/27/22 2210 08/28/22 2159   08/27/22 1300  ceFAZolin (ANCEF) IVPB 2g/100 mL premix        2 g 200 mL/hr over 30 Minutes Intravenous On call to O.R. 08/26/22 1111 08/27/22 1914        Assessment/Plan Fall down stairs   SDH, SAH - NSGY c/s, keppra for sz ppx x7 days, repeat CT negative. SLP eval pending R prox humerus fx - ortho evaluated and recommend total arthoplasty with Dr. Aundria Rud to evaluate R elbow fx/dislocation - s/p OR with Dr. Carola Frost 9/14. Getting CT of the elbow today to see if she needs further OR. NWB RUE Left wrist fx - s/p ORIF 9/14 Dr. Carola Frost. NWB LUE ABLA - hgb 8.4 from 9.8, monitor FEN - SLIV, reg diet, colace, add miralax DVT - SCDs, LMWH Foley - d/c 9/15  Dispo - TBI team therapies. Voiding trial. CT elbow as above. Follow up ortho recs.   I reviewed Consultant orthopedic notes, last 23  h vitals and pain scores, last 48 h intake and output, last 24 h labs and trends, and last 24 h imaging results.    LOS: 3 days    Franne Forts, [facility] 08/28/2022, 10:12 AM Please see Amion for pager number during day hours 7:00am-4:30pm

## 2022-08-28 NOTE — TOC Initial Note (Signed)
Transition of Care [facility]) - Initial/Assessment Note    Patient Details  Name: Vanessa [name] MRN: [id number] Date of Birth: May 09, 1951  Transition of Care [facility]) CM/SW Contact:    Ella Bodo, RN Phone Number: 08/28/2022, 4:33 PM  Clinical Narrative:                 Pt is a 71 yo female who presented to [facility] on 08/25/22 after falling down 12 stairs at her home and sustaining SAH, R elbow fx with dislocation, L distal radius and ulna fx, and R proximal humerus fx. S/p right radial head arthroplasty, R elbow LCL repair, R coronoid repair, application of R UE external fixator for persistent instability, and ORIF of L radius and ulna fx 9/14.  Met with patient to discuss discharge planning.  She states that her husband can provide 24h assistance at dc, and her daughter will be helping as well.  Noted PT/OT initially recommending OP therapies, but now recommending CIR.  Rehab consult ordered; will follow for updates as available.    Expected Discharge Plan: IP Rehab Facility Barriers to Discharge: (S) Continued Medical Work up          Expected Discharge Plan and Services Expected Discharge Plan: [place] arrangements for the past 2 months: Single Family Home                                      Prior Living Arrangements/Services Living arrangements for the past 2 months: Single Family Home Lives with:: Spouse Patient language and need for interpreter reviewed:: Yes Do you feel safe going back to the place where you live?: Yes      Need for Family Participation in Patient Care: Yes (Comment) Care giver support system in place?: Yes (comment)   Criminal Activity/Legal Involvement Pertinent to Current Situation/Hospitalization: No - Comment as needed                 Emotional Assessment Appearance:: Appears stated age Attitude/Demeanor/Rapport: Engaged Affect (typically observed): Accepting Orientation: : Oriented to Self, Oriented to  Place, Oriented to  Time, Oriented to Situation      Admission diagnosis:  Subarachnoid hemorrhage ([place]) [I60.9] Trauma [T14.90XA] Fall down stairs, initial encounter [W10.8XXA] Closed fracture of proximal end of right humerus, unspecified fracture morphology, initial encounter [S42.201A] Patient Active Problem List   Diagnosis Date Noted   Trauma 08/25/2022   Thrombocytopenia ([place]) 02/24/2016   Insomnia 08/09/2015   Snoring 10/14/2014   Hypertension 03/09/2012   Hyperlipidemia 03/09/2012   Depression 03/09/2012   PCP:  Arvella Nigh, MD Pharmacy:   [place], [address] [place] [place] [address] Phone: [phone] Fax: [phone]  [place], [place] [address] Phone: [phone] Fax: [phone]     Social Determinants of Health (SDOH) Interventions    Readmission Risk Interventions     No data to display         Reinaldo Raddle, RN, BSN  Trauma/Neuro ICU Case Manager [phone]

## 2022-08-28 NOTE — Progress Notes (Signed)
Physical Therapy Treatment Patient Details Name: Vanessa [name] MRN: [id number] DOB: September 14, 1951 Today's Date: 08/28/2022   History of Present Illness Pt is a 71 yo female who presented to [facility] on 08/25/22 after falling down 12 stairs at her home and sustaining SAH, R elbow fx with dislocation, L distal radius and ulna fx, and R proximal humerus fx. S/p right radial head arthroplasty, R elbow LCL repair, R coronoid repair, application of R UE external fixator for persistent instability, and ORIF of L radius and ulna fx 9/14. PMH: HTN, anxiety, depression, arthritis.    PT Comments    Pt is continuing to display R lower extremity weakness along with deficits in balance and cognition that place her at high risk for falls and injury. She required min-modA for transfers and modA to just take a few steps today with L elbow support. Pt had multiple LOB bouts due to her R knee buckling and her noted trunk sway. She has poor awareness of her deficits and needs cues to attend to them and correct them. Completed session with permitted UE AROM and lower extremity strengthening exercises to address her deficits. Due to her need for extensive assistance, drastic functional decline, and good family support, recommending intensive therapy in the AIR setting. If AIR is not approved, then recommend SNF. Will continue to follow acutely.     Recommendations for follow up therapy are one component of a multi-disciplinary discharge planning process, led by the attending physician.  Recommendations may be updated based on patient status, additional functional criteria and insurance authorization.  Follow Up Recommendations  Acute inpatient rehab (3hours/day) (SNF if not AIR)     Assistance Recommended at Discharge Frequent or constant Supervision/Assistance  Patient can return home with the following Assistance with cooking/housework;Help with stairs or ramp for entrance;Assist for transportation;Direct  supervision/assist for financial management;Direct supervision/assist for medications management;A lot of help with bathing/dressing/bathroom;A lot of help with walking and/or transfers   Equipment Recommendations  Other (comment) (TBA)    Recommendations for Other Services Rehab consult     Precautions / Restrictions Precautions Precautions: Fall Precaution Comments: external fixator R UE Required Braces or Orthoses: Splint/Cast Splint/Cast: external fixator R UE; splint L UE Restrictions Weight Bearing Restrictions: Yes RUE Weight Bearing: Non weight bearing LUE Weight Bearing: Weight bear through elbow only Other Position/Activity Restrictions: permitted to use L hand for basic ADLs and feed self; L UE digit motion as tolerated, no ROM restrictions to L elbow and shoulder; no lifting with R UE; permitted to move R digits and wrist as tolerated     Mobility  Bed Mobility Overal bed mobility: Needs Assistance Bed Mobility: Supine to Sit     Supine to sit: Min assist     General bed mobility comments: MinA to ascend trunk and cue pt to bring legs off EOB and pivot buttocks to then ascend trunk maintaining NWB through UEs.    Transfers Overall transfer level: Needs assistance Equipment used: 1 person hand held assist Transfers: Sit to/from Stand, Bed to chair/wheelchair/BSC Sit to Stand: Min assist, Mod assist   Step pivot transfers: Mod assist       General transfer comment: ModA to power up to stand and stabilize pt from EOB the first rep. Min-modA for subsequent x7 reps from recliner. Cued pt to place weight through L elbow on PT's hand/arm for stability. Cues provided to gain momentum with transfers. ModA for stability stepping to R bed > recliner with L elbow support. Pt displaying  trunk sway and R knee instability when stepping.    Ambulation/Gait Ambulation/Gait assistance: Mod assist Gait Distance (Feet): 3 Feet Assistive device: 1 person hand held assist Gait  Pattern/deviations: Step-through pattern, Decreased stride length, Decreased dorsiflexion - right, Wide base of support, Knees buckling Gait velocity: reduced Gait velocity interpretation: <1.31 ft/sec, indicative of household ambulator Pre-gait activities: Marching in place in front of recliner 2x 4-6 reps with modA, LOB due to R knee buckle and trunk sway, needing assistance to recover and cue pt to sit as needed for safety. Cues provided to slow down the weight shift to focus on extending R knee. General Gait Details: Pt with small, short, unsteady steps to R bed > recliner with L elbow support. Pt with trunk sway and R knee buckling at times, displaying posterior or lateral LOB bouts, needing modA to recover and safely navigate pt to chair.   Stairs             Wheelchair Mobility    Modified Rankin (Stroke Patients Only)       Balance Overall balance assessment: Needs assistance Sitting-balance support: Feet supported Sitting balance-Leahy Scale: Good     Standing balance support: During functional activity, Single extremity supported Standing balance-Leahy Scale: Poor Standing balance comment: Reliant on modA and L elbow support                            Cognition Arousal/Alertness: Awake/alert Behavior During Therapy: WFL for tasks assessed/performed Overall Cognitive Status: Impaired/Different from baseline Area of Impairment: Attention, Memory, Following commands, Safety/judgement, Awareness, Problem solving, Orientation, Rancho level               Rancho Levels of Cognitive Functioning Rancho Los Amigos Scales of Cognitive Functioning: Automatic, Appropriate Orientation Level: Disoriented to, Time (month was "July" because they had been "celebrating Christmas in July") Current Attention Level: Sustained Memory: Decreased short-term memory, Decreased recall of precautions Following Commands: Follows one step commands consistently, Follows one  step commands with increased time Safety/Judgement: Decreased awareness of deficits Awareness: Emergent Problem Solving: Difficulty sequencing, Requires verbal cues, Slow processing, Requires tactile cues General Comments: Oriented to location, self, and situation but thought the month was "July". Pt oriented to year. Pt needing cues to remain on task at times and for maintaining WB precautions. Slow processing and decreased awareness of deficits, needing cues to attend to deficits to correct them   [facility] Scales of Cognitive Functioning: Automatic, Appropriate    Exercises General Exercises - Upper Extremity Shoulder Flexion: Left, 10 reps, AROM, Seated Elbow Flexion: AROM, Left, 10 reps, Seated Elbow Extension: AROM, Left, 10 reps, Seated Wrist Flexion: Right, AROM, 10 reps, Seated Wrist Extension: AROM, Right, 10 reps, Seated Digit Composite Flexion: AROM, Both, 10 reps, Seated Composite Extension: AROM, Both, 10 reps, Seated General Exercises - Lower Extremity Long Arc Quad: AROM, Both, 20 reps, Seated Hip Flexion/Marching: AROM, Both, Other reps (comment), Standing (x2 bouts of ~4-6 reps each with modA and L elbow support for stability) Other Exercises Other Exercises: sit <> stand 8x with min-modA    General Comments        Pertinent Vitals/Pain Pain Assessment Pain Assessment: Faces Faces Pain Scale: Hurts little more Pain Location: UEs Pain Descriptors / Indicators: Discomfort, Guarding, Operative site guarding Pain Intervention(s): Limited activity within patient's tolerance, Monitored during session, Repositioned, Premedicated before session    Home Living  Prior Function            PT Goals (current goals can now be found in the care plan section) Acute Rehab PT Goals Patient Stated Goal: to get better PT Goal Formulation: With patient/family Time For Goal Achievement: 09/09/22 Potential to Achieve Goals:  Good Progress towards PT goals: Not progressing toward goals - comment (decline in functional status today)    Frequency    Min 5X/week      PT Plan Discharge plan needs to be updated;Frequency needs to be updated    Co-evaluation              AM-PAC PT "6 Clicks" Mobility   Outcome Measure  Help needed turning from your back to your side while in a flat bed without using bedrails?: A Little Help needed moving from lying on your back to sitting on the side of a flat bed without using bedrails?: A Little Help needed moving to and from a bed to a chair (including a wheelchair)?: A Lot Help needed standing up from a chair using your arms (e.g., wheelchair or bedside chair)?: A Lot Help needed to walk in hospital room?: Total Help needed climbing 3-5 steps with a railing? : Total 6 Click Score: 12    End of Session Equipment Utilized During Treatment: Gait belt Activity Tolerance: Patient tolerated treatment well Patient left: in chair;with call bell/phone within reach;with chair alarm set;with family/visitor present Nurse Communication: Mobility status PT Visit Diagnosis: Unsteadiness on feet (R26.81);Other abnormalities of gait and mobility (R26.89);Muscle weakness (generalized) (M62.81);Difficulty in walking, not elsewhere classified (R26.2);Pain Pain - Right/Left: Right Pain - part of body: Arm     Time: 1310-1346 PT Time Calculation (min) (ACUTE ONLY): 36 min  Charges:  $Therapeutic Exercise: 8-22 mins $Therapeutic Activity: 8-22 mins                     Raymond Gurney, PT, DPT Acute Rehabilitation Services  Office: [phone]    [name] 08/28/2022, 4:10 PM

## 2022-08-28 NOTE — Evaluation (Signed)
Speech Language Pathology Evaluation Patient Details Name: Vanessa [name] MRN: [id number] DOB: 1951/01/09 Today's Date: 08/28/2022 Time: 1137-1200 SLP Time Calculation (min) (ACUTE ONLY): 23 min  Problem List:  Patient Active Problem List   Diagnosis Date Noted   Trauma 08/25/2022   Thrombocytopenia (HCC) 02/24/2016   Insomnia 08/09/2015   Snoring 10/14/2014   Hypertension 03/09/2012   Hyperlipidemia 03/09/2012   Depression 03/09/2012   Past Medical History:  Past Medical History:  Diagnosis Date   Anxiety    Arthritis    Depression    Hypertension    Past Surgical History:  Past Surgical History:  Procedure Laterality Date   FRACTURE SURGERY     HPI:  Pt is a 71 y/o female who presented to the ED on 9/12 after falling down 12 stairs at her home and sustaining SAH, R elbow fx with dislocation, L distal radius and ulna fx, and R proximal humerus fx. PMH: HTN, anxiety, depression, arthritis.   Assessment / Plan / Recommendation Clinical Impression  Pt participated in speech-language-cognition evaluation with her daughter present for part of the evaluation. Pt reported that she has recently been having some difficulty with memory tasks provided by her PCP, but that she had not noticed any baseline difficulty with speech, language, or cognition. Pt stated that she was independent with medication and financial management prior to admission. The [facility] Mental Status Examination was completed to evaluate the pt's cognitive-linguistic skills. Her score was adjusted to exclude portions of the evaluation which required writing. She achieved an adjusted score of 18/24 which is below the normal limits. Her overall presentation was most representative of Rancho level VIII (Purposeful, Appropriate). She exhibited difficulty in the areas of awareness, attention, memory, complex problem solving, and executive function. Some tangentiality and word retrieval difficulty were noted  during conversational discourse; pt's daughter stated that pt demonstrated tangential tendencies at baseline, but that word retrieval difficulty is new. Motor speech skills were WFL. Skilled SLP services are clinically indicated at this time to improve cognitive-linguistic function.    SLP Assessment  SLP Recommendation/Assessment: Patient needs continued Speech Lanaguage Pathology Services SLP Visit Diagnosis: Cognitive communication deficit (R41.841)    Recommendations for follow up therapy are one component of a multi-disciplinary discharge planning process, led by the attending physician.  Recommendations may be updated based on patient status, additional functional criteria and insurance authorization.    Follow Up Recommendations  Outpatient SLP    Assistance Recommended at Discharge  Intermittent Supervision/Assistance  Functional Status Assessment Patient has had a recent decline in their functional status and demonstrates the ability to make significant improvements in function in a reasonable and predictable amount of time.  Frequency and Duration min 2x/week  2 weeks      SLP Evaluation Cognition  Overall Cognitive Status: Impaired/Different from baseline Arousal/Alertness: Awake/alert Orientation Level: Oriented X4 Year: 2023 Month: September Day of Week: Correct Attention: Focused;Sustained Focused Attention: Appears intact Sustained Attention: Impaired Sustained Attention Impairment: Verbal complex Memory: Impaired Memory Impairment:  (Immediate: 5/5 with repetition x5; delayed:) Awareness: Impaired Awareness Impairment: Emergent impairment Problem Solving: Impaired Problem Solving Impairment: Verbal complex (money: 3/3 with repetition; time) Executive Function: Landscape architect: Impaired Organizing Impairment: Verbal complex (backward digit span: 1/2) Rancho [employer] Scales of Cognitive Functioning: Purposeful, Appropriate       Comprehension  Auditory  Comprehension Overall Auditory Comprehension: Appears within functional limits for tasks assessed Yes/No Questions: Within Functional Limits Commands: Within Functional Limits Conversation: Complex    Expression  Expression Primary Mode of Expression: Verbal Verbal Expression Overall Verbal Expression: Impaired Initiation: No impairment Level of Generative/Spontaneous Verbalization: Conversation Repetition: No impairment Naming: Impairment Other Naming Comments:  (word retrieval difficult during conversation) Pragmatics: Impairment Impairments: Topic maintenance   Oral / Motor  Oral Motor/Sensory Function Overall Oral Motor/Sensory Function: Within functional limits Motor Speech Overall Motor Speech: Appears within functional limits for tasks assessed Respiration: Within functional limits Phonation: Normal Resonance: Within functional limits Articulation: Within functional limitis Intelligibility: Intelligible Motor Planning: Witnin functional limits Motor Speech Errors: Not applicable           Jr Milliron I. Vear Clock, MS, CCC-SLP Acute Rehabilitation Services Office number [phone]  Scheryl Marten 08/28/2022, 12:28 PM

## 2022-08-28 NOTE — Progress Notes (Signed)
Per order foley d/c at 1015. Pt tolerated well/ Pt is due to void by 1615.

## 2022-08-28 NOTE — Progress Notes (Signed)
Peripherally Inserted Central Catheter Placement  The IV Nurse has discussed with the patient and/or persons authorized to consent for the patient, the purpose of this procedure and the potential benefits and risks involved with this procedure.  The benefits include less needle sticks, lab draws from the catheter, and the patient may be discharged home with the catheter. Risks include, but not limited to, infection, bleeding, blood clot (thrombus formation), and puncture of an artery; nerve damage and irregular heartbeat and possibility to perform a PICC exchange if needed/ordered by physician.  Alternatives to this procedure were also discussed.  Bard Power PICC patient education guide, fact sheet on infection prevention and patient information card has been provided to patient /or left at bedside.    PICC Placement Documentation  PICC Triple Lumen 08/28/22 Left Cephalic 44 cm 0 cm (Active)  Indication for Insertion or Continuance of Line Limited venous access - need for IV therapy >5 days (PICC only) 08/28/22 1744  Exposed Catheter (cm) 0 cm 08/28/22 1744  Site Assessment Clean, Dry, Intact 08/28/22 1744  Lumen #1 Status Flushed;Saline locked;Blood return noted 08/28/22 1744  Lumen #2 Status Flushed;Saline locked;Blood return noted 08/28/22 1744  Lumen #3 Status Flushed;Saline locked;Blood return noted 08/28/22 1744  Dressing Type Transparent;Securing device 08/28/22 1744  Dressing Status Antimicrobial disc in place;Clean, Dry, Intact 08/28/22 1744  Safety Lock Not Applicable 08/28/22 1744  Line Adjustment (NICU/IV Team Only) No 08/28/22 1744  Dressing Intervention New dressing;Other (Comment) 08/28/22 1744  Dressing Change Due 09/04/22 08/28/22 1744     Okay to use LUA for PICC placement per MD. R arm restricted.  Annett Fabian 08/28/2022, 5:46 PM

## 2022-08-28 NOTE — Progress Notes (Signed)
Inpatient Rehab Admissions Coordinator:  ? ?Per therapy recommendations,  patient was screened for CIR candidacy by Ellianah Cordy, MS, CCC-SLP. At this time, Pt. Appears to be a a potential candidate for CIR. I will place   order for rehab consult per protocol for full assessment. Please contact me any with questions. ? ?Uriyah Massimo, MS, CCC-SLP ?Rehab Admissions Coordinator  ?[phone] (celll) ?[phone] (office) ? ?

## 2022-08-28 NOTE — Progress Notes (Signed)
Orthopaedic Trauma Service Progress Note  Patient ID: Vanessa [name] MRN: [id number] DOB/AGE: [birth year] 71 y.o.  Subjective:  Doing okay given magnitude of surgery yesterday More pain in right elbow than left wrist Right shoulder is doing okay  No other complaints other than being a little sleepy  ROS As above Objective:   VITALS:   Vitals:   08/27/22 2205 08/27/22 2347 08/28/22 0411 08/28/22 0733  BP: (!) 157/89 (!) 192/77 (!) 143/66 135/62  Pulse: (!) 109 (!) 104 (!) 118 (!) 108  Resp: 16 14 16 19   Temp: 98.4 F (36.9 C) 97.9 F (36.6 C) 98.9 F (37.2 C) 98.7 F (37.1 C)  TempSrc: Oral Oral Oral Oral  SpO2: 100% 94% 96% 95%  Weight:      Height:        Estimated body mass index is 32.28 kg/m as calculated from the following:   Height as of this encounter: 5\' 6"  (1.676 m).   Weight as of this encounter: 90.7 kg.   Intake/Output      09/14 0701 09/15 0700 09/15 0701 09/16 0700   I.V. (mL/kg) 2000 (22.1)    IV Piggyback 550    Total Intake(mL/kg) 2550 (28.1)    Urine (mL/kg/hr) 2250 (1)    Blood 150    Total Output 2400    Net +150           LABS  Results for orders placed or performed during the hospital encounter of 08/25/22 (from the past 24 hour(s))  CBC     Status: Abnormal   Collection Time: 08/28/22  6:00 AM  Result Value Ref Range   WBC 7.0 4.0 - 10.5 K/uL   RBC 2.62 (L) 3.87 - 5.11 MIL/uL   Hemoglobin 8.4 (L) 12.0 - 15.0 g/dL   HCT 10/25/22 (L) 08/30/22 - 17.0 %   MCV 94.7 80.0 - 100.0 fL   MCH 32.1 26.0 - 34.0 pg   MCHC 33.9 30.0 - 36.0 g/dL   RDW 01.7 49.4 - 49.6 %   Platelets 141 (L) 150 - 400 K/uL   nRBC 0.0 0.0 - 0.2 %  VITAMIN D 25 Hydroxy (Vit-D Deficiency, Fractures)     Status: None   Collection Time: 08/28/22  6:00 AM  Result Value Ref Range   Vit D, 25-Hydroxy 30.91 30 - 100 ng/mL     PHYSICAL EXAM:   Gen: resting comfortably in bed, NAD, appears  well, very pleasant, daughter at bedside Lungs: unlabored  Cardiac: reg Ext:       Right Upper Extremity              External fixator right elbow is intact   Dressings are stable   Pin sites look good             Radial, ulnar, median motor and sensory functions intact             Axillary nerve motor and sensory functions grossly intact             AIN and PIN motor intact             Ext warm              Brisk cap refill  Swelling stable        Left upper extremity              Volar splint is fitting well             Moderate swelling              Radial, ulnar, median motor and sensory functions intact             AiN and PIN motor intact             No pain out of proportion with passive stretching              Brisk cap refill              Good perfusion distally   Assessment/Plan: 1 Day Post-Op   Principal Problem:   Trauma   Anti-infectives (From admission, onward)    Start     Dose/Rate Route Frequency Ordered Stop   08/27/22 2300  ceFAZolin (ANCEF) IVPB 2g/100 mL premix        2 g 200 mL/hr over 30 Minutes Intravenous Every 8 hours 08/27/22 2210 08/28/22 2159   08/27/22 1300  ceFAZolin (ANCEF) IVPB 2g/100 mL premix        2 g 200 mL/hr over 30 Minutes Intravenous On call to O.R. 08/26/22 1111 08/27/22 1914     .  POD/HD#: 93  71 year old right-hand-dominant female fall down approximately 12 stairs, polytrauma   -Fall down 12 stairs   - R proximal humerus fracture             High comminuted             Recommend total shoulder arthoplasty              Dr. Rogers---> total shoulder arthroplasty in about 10 to 14 days   - R elbow fracture dislocation ---> terrible triad type pattern s/p right radial head arthroplasty, LCL repair, coronoid repair, application of external fixator for persistent instability   Nonweightbearing right upper extremity  Okay to move digits and wrist as tolerated  No lifting with right upper extremity   Fixator  dressing changes as needed otherwise leave alone   Repeat CT scan to further evaluate right elbow.  May return to the OR for revision early next week  Check x-ray of contralateral elbow for comparison  -Left distal radius and ulna fracture s/p ORIF  Nonweightbearing to left wrist  Okay to use left hand for basic ADLs and to feed self  No axial loading of the left wrist  Ice and elevate  Digit motion as tolerated  No motion restrictions to elbow and shoulder   Therapy evaluations    - Pain management:               Multimodal   - ABL anemia/Hemodynamics               Stable - Medical issues                For trauma    - Metabolic Bone Disease:               Vitamin D levels are low normal   - Dispo:             Continue with inpatient care  Follow-up on repeat CT scan of right elbow, possible return to the OR Monday or Tuesday  Will make NPO at 0001 on 08/31/2022 for possible revision  of R elbow      [name], PA-C [phone] (C) 08/28/2022, 9:57 AM  [facility] [address] [place] [address] [phone] [phone] (F)    After 5pm and on the weekends please log on to Amion, go to orthopaedics and the look under the [facility] Call for the provider(s) on call. You can also call our office at [phone] and then follow the prompts to be connected to the call team.   Patient ID: Vanessa [name], female   DOB: Jul 25, 1951, 71 y.o.   MRN: [id number]

## 2022-08-29 LAB — CBC
HCT: 23.3 % — ABNORMAL LOW (ref 36.0–46.0)
Hemoglobin: 7.7 g/dL — ABNORMAL LOW (ref 12.0–15.0)
MCH: 32.5 pg (ref 26.0–34.0)
MCHC: 33 g/dL (ref 30.0–36.0)
MCV: 98.3 fL (ref 80.0–100.0)
Platelets: 127 10*3/uL — ABNORMAL LOW (ref 150–400)
RBC: 2.37 MIL/uL — ABNORMAL LOW (ref 3.87–5.11)
RDW: 13.9 % (ref 11.5–15.5)
WBC: 5.4 10*3/uL (ref 4.0–10.5)
nRBC: 0 % (ref 0.0–0.2)

## 2022-08-29 LAB — BASIC METABOLIC PANEL
Anion gap: 7 (ref 5–15)
BUN: 15 mg/dL (ref 8–23)
CO2: 25 mmol/L (ref 22–32)
Calcium: 8.4 mg/dL — ABNORMAL LOW (ref 8.9–10.3)
Chloride: 109 mmol/L (ref 98–111)
Creatinine, Ser: 0.82 mg/dL (ref 0.44–1.00)
GFR, Estimated: >60 mL/min (ref >60)
Glucose, Bld: 144 mg/dL — ABNORMAL HIGH (ref 70–99)
Potassium: 3.1 mmol/L — ABNORMAL LOW (ref 3.5–5.1)
Sodium: 141 mmol/L (ref 135–145)

## 2022-08-29 MED ORDER — PANTOPRAZOLE SODIUM 40 MG PO TBEC
40.0000 mg | DELAYED_RELEASE_TABLET | Freq: Every day | ORAL | Status: DC
  Administered 2022-08-29 – 2022-09-03 (×6): 40 mg via ORAL
  Filled 2022-08-29 (×6): qty 1

## 2022-08-29 MED ORDER — POTASSIUM CHLORIDE CRYS ER 20 MEQ PO TBCR
40.0000 meq | EXTENDED_RELEASE_TABLET | Freq: Once | ORAL | Status: AC
  Administered 2022-08-29: 40 meq via ORAL
  Filled 2022-08-29: qty 2

## 2022-08-29 MED ORDER — VITAMIN D 25 MCG (1000 UNIT) PO TABS
1000.0000 [IU] | ORAL_TABLET | Freq: Every day | ORAL | Status: DC
  Administered 2022-08-29 – 2022-09-03 (×6): 1000 [IU] via ORAL
  Filled 2022-08-29 (×6): qty 1

## 2022-08-29 NOTE — Progress Notes (Signed)
Inpatient Rehab Admissions Coordinator:   Cir consult received. OT currently with recs for outpatient. To get insurance auth, both PT and OT will need to be recommending CIR. I will follow up with pt. And pursue AIR if OT updates recommendations to AIR.   Clemens Catholic, [place], [place] Admissions Coordinator  [phone] ([place]) [phone] (office)

## 2022-08-29 NOTE — Progress Notes (Signed)
Occupational Therapy Treatment Patient Details Name: Vanessa [name] MRN: [id number] DOB: 06-08-1951 Today's Date: 08/29/2022   History of present illness Pt is a 71 yo female who presented to [facility] on 08/25/22 after falling down 12 stairs at her home and sustaining SAH, R elbow fx with dislocation, L distal radius and ulna fx, and R proximal humerus fx. S/p right radial head arthroplasty, R elbow LCL repair, R coronoid repair, application of R UE external fixator for persistent instability, and ORIF of L radius and ulna fx 9/14. PMH: HTN, anxiety, depression, arthritis.   OT comments  Pt progressing towards established OT goals. Pt is very motivated to participate in therapy. Facilitating AROM/AAROM/PROM of BUEs; x10 each. Pt performing marching activity while standing; demonstrating poor balance with L lateral LOB x4. Requiring Min-mod A to correct LOB. Pt also continues to demonstrate decreased memory, problem solving, and awareness consistent with Ranchos level Vll. Update dc recommendation to AIR to optimize balance, safety, and cognition in preparation for safe dc with family.Will continue to follow acutely as admitted.    Recommendations for follow up therapy are one component of a multi-disciplinary discharge planning process, led by the attending physician.  Recommendations may be updated based on patient status, additional functional criteria and insurance authorization.    Follow Up Recommendations  Acute inpatient rehab (3hours/day)    Assistance Recommended at Discharge Frequent or constant Supervision/Assistance  Patient can return home with the following  A little help with walking and/or transfers;A lot of help with bathing/dressing/bathroom;Assistance with cooking/housework;Assistance with feeding;Direct supervision/assist for medications management;Direct supervision/assist for financial management;Help with stairs or ramp for entrance;Assist for transportation   Equipment  Recommendations  BSC/3in1    Recommendations for Other Services      Precautions / Restrictions Precautions Precautions: Fall Precaution Comments: external fixator R UE Required Braces or Orthoses: Splint/Cast Splint/Cast: external fixator R UE; splint L UE Restrictions Weight Bearing Restrictions: Yes RUE Weight Bearing: Non weight bearing LUE Weight Bearing: Non weight bearing Other Position/Activity Restrictions: LUE: permitted to use L hand for basic ADLs and feed self; L UE digit motion as tolerated, no ROM restrictions to L elbow and shoulder. RUE: no lifting with R UE; permitted to move R digits and wrist as tolerated       Mobility Bed Mobility               General bed mobility comments: Sitting in recliner upon arrival    Transfers Overall transfer level: Needs assistance Equipment used: 1 person hand held assist Transfers: Sit to/from Stand, Bed to chair/wheelchair/BSC Sit to Stand: Min assist           General transfer comment: Min A for gaining balance in standing     Balance Overall balance assessment: Needs assistance Sitting-balance support: Feet supported Sitting balance-Leahy Scale: Good     Standing balance support: During functional activity, Single extremity supported Standing balance-Leahy Scale: Poor                             ADL either performed or assessed with clinical judgement   ADL Overall ADL's : Needs assistance/impaired     Grooming: Wash/dry hands;Minimal assistance;Sitting                               Functional mobility during ADLs: Minimal assistance General ADL Comments: Pt will need further education on compensatory techniques  for ADLs. Focused on ROM exercises and balance    Extremity/Trunk Assessment Upper Extremity Assessment Upper Extremity Assessment: RUE deficits/detail;LUE deficits/detail RUE Deficits / Details: Ex fix immobilizing elbow. Able to perform digit opposition, digit  flex/ext, and wrist ROM. RUE Sensation: WNL RUE Coordination: decreased fine motor;decreased gross motor LUE Deficits / Details: full shoulder ROM, able to wiggle fingers. denies sensation changes. sx planned 9/14 LUE Sensation: WNL LUE Coordination: decreased fine motor   Lower Extremity Assessment Lower Extremity Assessment: Defer to PT evaluation        Vision       Perception     Praxis      Cognition Arousal/Alertness: Awake/alert Behavior During Therapy: WFL for tasks assessed/performed Overall Cognitive Status: Impaired/Different from baseline Area of Impairment: Attention, Memory, Following commands, Safety/judgement, Awareness, Problem solving, Rancho level               Rancho Levels of Cognitive Functioning Rancho Los Amigos Scales of Cognitive Functioning: Automatic, Appropriate   Current Attention Level: Sustained Memory: Decreased short-term memory, Decreased recall of precautions Following Commands: Follows one step commands consistently, Follows one step commands with increased time Safety/Judgement: Decreased awareness of deficits Awareness: Emergent Problem Solving: Difficulty sequencing, Requires verbal cues, Slow processing, Requires tactile cues General Comments: Pt very agreeable to therapy and motivated. Requiring increased time for processing thorughout. Decreased memory and asking reassuring quesyions throughout. In naming animals that start with a "C, pt able name 1/3 without cues. Rancho [employer] Scales of Cognitive Functioning: Automatic, Appropriate      Exercises Exercises: General Upper Extremity, General Lower Extremity, Other exercises, Hand exercises General Exercises - Upper Extremity Shoulder Flexion: AROM, Left, 10 reps, Seated, PROM, Right, 5 reps, Limitations Shoulder Flexion Limitations: RUE only within 0-30 degrees and very gentle PROM Shoulder ABduction: PROM, Right, 5 reps, Seated Elbow Flexion: AROM, Left, 10 reps,  Seated Elbow Extension: AROM, Left, 10 reps, Seated Wrist Flexion: Right, AROM, 10 reps, Seated Wrist Extension: AROM, Right, 10 reps, Seated Digit Composite Flexion: AROM, Both, 10 reps, Seated Composite Extension: AROM, Both, 10 reps, Seated General Exercises - Lower Extremity Long Arc Quad: AROM, Both, 20 reps, Seated Hip Flexion/Marching: AROM, Both, Other reps (comment), Standing, Seated Hand Exercises Forearm Supination: AROM, Left, 10 reps, Seated Forearm Pronation: AROM, Left, 10 reps, Seated Thumb Abduction: AROM, Left, 10 reps, Seated Thumb Adduction: AROM, Left, 10 reps, Seated Opposition: AROM, Left, 10 reps, Seated    Shoulder Instructions       General Comments HR elevating to 164    Pertinent Vitals/ Pain       Pain Assessment Pain Assessment: Faces Faces Pain Scale: Hurts a little bit Pain Location: UEs Pain Descriptors / Indicators: Discomfort, Guarding, Operative site guarding Pain Intervention(s): Monitored during session, Repositioned  Home Living                                          Prior Functioning/Environment              Frequency  Min 2X/week        Progress Toward Goals  OT Goals(current goals can now be found in the care plan section)     Acute Rehab OT Goals OT Goal Formulation: With patient Time For Goal Achievement: 09/09/22 Potential to Achieve Goals: Good ADL Goals Pt Will Perform Grooming: with set-up;sitting Pt Will Perform Upper Body Dressing: with  min assist;sitting Pt Will Perform Lower Body Dressing: with min assist;sit to/from stand Pt Will Transfer to Toilet: with modified independence;ambulating Additional ADL Goal #1: pt will indep maintain BUE precautions throughout ADL tasks  Plan Discharge plan needs to be updated    Co-evaluation                 AM-PAC OT "6 Clicks" Daily Activity     Outcome Measure   Help from another person eating meals?: A Lot Help from another person  taking care of personal grooming?: A Lot Help from another person toileting, which includes using toliet, bedpan, or urinal?: A Lot Help from another person bathing (including washing, rinsing, drying)?: A Lot Help from another person to put on and taking off regular upper body clothing?: A Lot Help from another person to put on and taking off regular lower body clothing?: A Lot 6 Click Score: 12    End of Session Equipment Utilized During Treatment: Gait belt  OT Visit Diagnosis: Unsteadiness on feet (R26.81);Muscle weakness (generalized) (M62.81);History of falling (Z91.81);Pain   Activity Tolerance Patient tolerated treatment well   Patient Left in chair;with call bell/phone within reach;with chair alarm set;with family/visitor present   Nurse Communication Mobility status        Time: 1423-1450 OT Time Calculation (min): 27 min  Charges: OT General Charges $OT Visit: 1 Visit OT Treatments $Therapeutic Activity: 8-22 mins $Therapeutic Exercise: 8-22 mins  Elan Mcelvain MSOT, OTR/L Acute Rehab Office: [phone]  Theodoro Grist Kendi Defalco 08/29/2022, 4:51 PM

## 2022-08-29 NOTE — Progress Notes (Signed)
2 Days Post-Op   Subjective/Chief Complaint: No c/o No n/v No BM Pain controlled States eating ok   Objective: Vital signs in last 24 hours: Temp:  [98.1 F (36.7 C)-99.5 F (37.5 C)] 98.2 F (36.8 C) (09/16 0729) Pulse Rate:  [92-113] 106 (09/16 0729) Resp:  [14-18] 16 (09/16 0729) BP: (109-144)/(51-67) 144/67 (09/16 0729) SpO2:  [92 %-97 %] 93 % (09/16 0729) Last BM Date : 08/24/22  Intake/Output from previous day: 09/15 0701 - 09/16 0700 In: 440 [P.O.:240; IV Piggyback:200] Out: 2750 [Urine:2750] Intake/Output this shift: No intake/output data recorded.  Gen:  Alert, NAD, pleasant HEENT: left periorbital bruising Card:  RRR, palpable pedal pulses bilaterally, fingers WWP bilaterally Pulm:  CTAB, no W/R/R, rate and effort normal on room air Abd: Soft, NT/ND, +BS BLE:  calves soft and nontender without edema BUE: RUE in splint and ex fix - hand WWP, fingers mildly edematous. LUE in splint - hand WWP, fingers mildly edematous Psych: A&Ox4 Skin: no rashes noted, warm and dry  Lab Results:  Recent Labs    08/28/22 0600 08/29/22 0520  WBC 7.0 5.4  HGB 8.4* 7.7*  HCT 24.8* 23.3*  PLT 141* 127*   BMET Recent Labs    08/29/22 0520  NA 141  K 3.1*  CL 109  CO2 25  GLUCOSE 144*  BUN 15  CREATININE 0.82  CALCIUM 8.4*   PT/INR No results for input(s): "LABPROT", "INR" in the last 72 hours. ABG No results for input(s): "PHART", "HCO3" in the last 72 hours.  Invalid input(s): "PCO2", "PO2"  Studies/Results:  Anti-infectives: Anti-infectives (From admission, onward)    Start     Dose/Rate Route Frequency Ordered Stop   08/27/22 2300  ceFAZolin (ANCEF) IVPB 2g/100 mL premix        2 g 200 mL/hr over 30 Minutes Intravenous Every 8 hours 08/27/22 2210 08/28/22 2159   08/27/22 1300  ceFAZolin (ANCEF) IVPB 2g/100 mL premix        2 g 200 mL/hr over 30 Minutes Intravenous On call to O.R. 08/26/22 1111 08/27/22 1914       Assessment/Plan:   Fall  down stairs   SDH, SAH - NSGY c/s, keppra for sz ppx x7 days, repeat CT negative. SLP eval pending R prox humerus fx - ortho evaluated and recommend total arthoplasty with Dr. Stann Mainland to evaluate - prob surgery in 10-12 days per ortho R elbow fx/dislocation - s/p OR with Dr. Marcelino Scot 9/14. Getting CT of the elbow today to see if she needs further OR. NWB RUE; possible revision surgery 9/18 Left wrist fx - s/p ORIF 9/14 Dr. Marcelino Scot. NWB LUE ABLA - 9.8-->8.4-->7.7, monitor, will type &screen in AM in case cont to trend down FEN - SLIV, reg diet, colace, miralax; hypokalemia - replace potassium, repeat lytes in am; add protonix since on scheduled toradol; add daily vit d3;  no BM since admiss - on colace/miralax - monitor DVT - SCDs, LMWH Foley - d/c 9/15   Dispo - TBI team therapies. Voiding trial. CT elbow as above. Follow up ortho recs.   I reviewed Consultant orthopedic notes, last 24 h vitals and pain scores, last 48 h intake and output, last 24 h labs and trends, and last 24 h imaging results.  LOS: 4 days    Greer Pickerel 08/29/2022

## 2022-08-29 NOTE — Progress Notes (Signed)
Orthopaedic Trauma Progress Note  SUBJECTIVE: Doing well today.  Pain controlled.  No specific concerns or complaints.  Has been working on moving her fingers to assist with swelling.  Denies any nausea or vomiting.  No fever or chills.  OBJECTIVE:  Vitals:   08/29/22 0729 08/29/22 1134  BP: (!) 144/67 (!) 141/65  Pulse: (!) 106 98  Resp: 16 19  Temp: 98.2 F (36.8 C) 97.7 F (36.5 C)  SpO2: 93% 98%    General: Sitting up in bed, no acute distress.  Pleasant and cooperative Respiratory: No increased work of breathing.  Right Upper Extremity: Ex-Fix in place.  Dressing stable.  Pin sites look good.  Radial, ulnar, median motor and sensory function intact.  Axillary nerve motor and sensory function grossly intact.  AIN and PIN motor intact.  Extremity warm.  Fingers well-perfused.  Swelling stable. Left upper extremity: Volar splint in place.  Moderate swelling to the hand.  Radial, ulnar, median nerve motor and sensory function intact.  No pain out of proportion with passive stretch of the fingers.  Hand warm and well-perfused.              IMAGING: Stable post op imaging.   LABS:  Results for orders placed or performed during the hospital encounter of 08/25/22 (from the past 24 hour(s))  CBC     Status: Abnormal   Collection Time: 08/29/22  5:20 AM  Result Value Ref Range   WBC 5.4 4.0 - 10.5 K/uL   RBC 2.37 (L) 3.87 - 5.11 MIL/uL   Hemoglobin 7.7 (L) 12.0 - 15.0 g/dL   HCT 23.3 (L) 36.0 - 46.0 %   MCV 98.3 80.0 - 100.0 fL   MCH 32.5 26.0 - 34.0 pg   MCHC 33.0 30.0 - 36.0 g/dL   RDW 13.9 11.5 - 15.5 %   Platelets 127 (L) 150 - 400 K/uL   nRBC 0.0 0.0 - 0.2 %  Basic metabolic panel     Status: Abnormal   Collection Time: 08/29/22  5:20 AM  Result Value Ref Range   Sodium 141 135 - 145 mmol/L   Potassium 3.1 (L) 3.5 - 5.1 mmol/L   Chloride 109 98 - 111 mmol/L   CO2 25 22 - 32 mmol/L   Glucose, Bld 144 (H) 70 - 99 mg/dL   BUN 15 8 - 23 mg/dL   Creatinine, Ser 0.82 0.44 -  1.00 mg/dL   Calcium 8.4 (L) 8.9 - 10.3 mg/dL   GFR, Estimated >60 >60 mL/min   Anion gap 7 5 - 15    ASSESSMENT: Vanessa [name] is a 71 y.o. female, 2 Days Post-Op s/p RADIAL HEAD ARTHROPLASTY ELBOW LIGAMENT RECONSTRUCTION ORIF LEFT DISTAL RADIUS  AND ULNA FRACTURE RIGHT PROXIMAL HUMERUS FRACTURE  CV/Blood loss: Acute blood loss anemia, Hgb 7.7 this a.m. Hemodynamically stable  PLAN: Weightbearing: - LUE: NWB through the wrist. - RUE: NWB ROM:  - LUE: Shoulder, elbow, digit motion as tolerated.  Okay to use left hand for basic ADLs and to feed self. No axial loading of the left wrist  - RUE: Wrist and finger motion as tolerated. Incisional and dressing care:  - LUE: Maintain splint - RUE: Change pin dressings as needed Showering: Okay for bed bath Orthopedic device(s):   - LUE: Volar splint - RUE: External fixator Pain management: Continue current regimen of multimodal pain control VTE prophylaxis: Lovenox, SCDs ID: Perioperative ABX completed Foley/Lines:  No foley, KVO IVFs Impediments to Fracture Healing: Vit  D level 30, started on supplementation Dispo: Continue with inpatient care.  Continue therapies as tolerated.  Possible return to the OR Monday or Tuesday for revision right elbow.  NPO 0001 on 08/31/2022.  Patient will require total shoulder arthroplasty by Dr. Aundria Rud in about 10 to 14 days  Follow - up plan:  To be determined   Contact information:  Truitt Merle MD, Thyra Breed PA-C. After hours and holidays please check [email or web address] for group call information for [facility]   Thompson Caul, PA-C [phone] (office) [email or web address]

## 2022-08-30 ENCOUNTER — Inpatient Hospital Stay (HOSPITAL_COMMUNITY): Payer: Medicare Other

## 2022-08-30 LAB — BASIC METABOLIC PANEL
Anion gap: 8 (ref 5–15)
BUN: 14 mg/dL (ref 8–23)
CO2: 25 mmol/L (ref 22–32)
Calcium: 8.4 mg/dL — ABNORMAL LOW (ref 8.9–10.3)
Chloride: 108 mmol/L (ref 98–111)
Creatinine, Ser: 0.67 mg/dL (ref 0.44–1.00)
GFR, Estimated: >60 mL/min (ref >60)
Glucose, Bld: 106 mg/dL — ABNORMAL HIGH (ref 70–99)
Potassium: 3.4 mmol/L — ABNORMAL LOW (ref 3.5–5.1)
Sodium: 141 mmol/L (ref 135–145)

## 2022-08-30 LAB — CBC
HCT: 22.7 % — ABNORMAL LOW (ref 36.0–46.0)
Hemoglobin: 7.5 g/dL — ABNORMAL LOW (ref 12.0–15.0)
MCH: 32.5 pg (ref 26.0–34.0)
MCHC: 33 g/dL (ref 30.0–36.0)
MCV: 98.3 fL (ref 80.0–100.0)
Platelets: 121 10*3/uL — ABNORMAL LOW (ref 150–400)
RBC: 2.31 MIL/uL — ABNORMAL LOW (ref 3.87–5.11)
RDW: 14.5 % (ref 11.5–15.5)
WBC: 3.8 10*3/uL — ABNORMAL LOW (ref 4.0–10.5)
nRBC: 0.5 % — ABNORMAL HIGH (ref 0.0–0.2)

## 2022-08-30 LAB — TYPE AND SCREEN
ABO/RH(D): B POS
Antibody Screen: NEGATIVE

## 2022-08-30 LAB — MAGNESIUM: Magnesium: 2 mg/dL (ref 1.7–2.4)

## 2022-08-30 LAB — ABO/RH: ABO/RH(D): B POS

## 2022-08-30 NOTE — PMR Pre-admission (Signed)
PMR Admission Coordinator Pre-Admission Assessment  Patient: Vanessa [name] is an 71 y.o., female MRN: [id number] DOB: [birth year] Height: $RemoveBeforeDE'5\' 6"'GyOJaqRpUVAVyVh$  (167.6 cm) Weight: 90.7 kg  Insurance Information HMO: yes    PPO:      PCP:      IPA:      80/20:      OTHER:  PRIMARY: [employer] Medicare      Policy#: [id number]      Subscriber: Pt.  CM Name:       Phone#: [phone]     Fax#: [phone] Pre-Cert#: [id number] approved for 13 days from 9/20 to 09/14/22     Employer: Not employed Benefits:  Phone #:  [phone]    Name: Checked on line Eff Date: 12/14/2021 - still active Deductible: $0 (does not have deductible) OOP Max: $3,600 ($20 met) CIR: $295/day co-pay for days 1-5, $0/day co-pay for days 6+ SNF: $0.00 Copayment per day for days 1-20; $196.00 Copayment per day for days 21-39; $0.00 Copayment per day for days 40-100 for Medicare-covered care/maximum 100 days/benefit period Outpatient: $20/visit co-pay Home Health:  100% coverage; limited by medical necessity DME: 80% coverage; 20% co-insurance Providers: in network   SECONDARY:       Policy#:      Phone#:   Development worker, community:       Phone#:   The Engineer, petroleum" for patients in Inpatient Rehabilitation Facilities with attached "Privacy Act [place] Records" was provided and verbally reviewed with: Patient and Family  Emergency Contact Information Contact Information     Name Relation Home Work Mobile   [place] Spouse [phone]  [phone]       Current Medical History  Patient Admitting Diagnosis: Fall s// polytrauma  History of Present Illness: Pt. Is a  71 year old woman  with PMH of anxiety, depression, and arthritis who presented as a level 2 after a fall down about 12 stairs 08/25/22. Pt. Sustained SAH, R elbow fx with dislocation, L distal radius and ulna fx, and R proximal humerus fx. S/p right radial head arthroplasty, R elbow LCL repair, R coronoid repair, application of R  UE external fixator for persistent instability, and ORIF of L radius and ulna fx 9/14. Pt. With external fixator for RUE, a splint on LUE, and is NWB on BUEs. Pt. Is permitted to use L hand for basic ADLs and self feeding. LUE digit motion as tolerated, no ROM restrictions to the L elbow and shoulder. For RUE, no lifting, permitted to move R digits and wrist as tolerated. Ortho with plans to return to OR around 9/28 (potentially a few days later) for shoulder replacement with Dr. Stann Mainland. Pt. Seen by PT/OT/SLP who recommend CIR to assist return to PLOF.   Patient's medical record from [facility]  has been reviewed by the rehabilitation admission coordinator and physician.  Past Medical History  Past Medical History:  Diagnosis Date   Anxiety    Arthritis    Depression    Hypertension     Has the patient had major surgery during 100 days prior to admission? Yes  Family History   family history includes Cancer in her mother.  Current Medications  Current Facility-Administered Medications:    acetaminophen (TYLENOL) tablet 1,000 mg, 1,000 mg, Oral, Q6H, Ainsley Spinner, PA-C, 1,000 mg at 09/03/22 0901   albuterol (PROVENTIL) (2.5 MG/3ML) 0.083% nebulizer solution 3 mL, 3 mL, Inhalation, Q4H PRN, Ainsley Spinner, PA-C   ascorbic acid (VITAMIN C) tablet 500 mg, 500 mg,  Oral, Daily, Meuth, Brooke A, PA-C, 500 mg at 09/03/22 0900   atorvastatin (LIPITOR) tablet 20 mg, 20 mg, Oral, Daily, Meuth, Brooke A, PA-C, 20 mg at 09/03/22 0900   bisacodyl (DULCOLAX) suppository 10 mg, 10 mg, Rectal, Daily PRN, Ainsley Spinner, PA-C   Chlorhexidine Gluconate Cloth 2 % PADS 6 each, 6 each, Topical, Daily, Ainsley Spinner, PA-C, 6 each at 09/03/22 7078   cholecalciferol (VITAMIN D3) 25 MCG (1000 UNIT) tablet 1,000 Units, 1,000 Units, Oral, Daily, Greer Pickerel, MD, 1,000 Units at 09/03/22 0900   docusate sodium (COLACE) capsule 100 mg, 100 mg, Oral, BID, Ainsley Spinner, PA-C, 100 mg at 09/03/22 0901    DULoxetine (CYMBALTA) DR capsule 60 mg, 60 mg, Oral, Daily, Ainsley Spinner, PA-C, 60 mg at 09/03/22 0901   enoxaparin (LOVENOX) injection 30 mg, 30 mg, Subcutaneous, Q12H, Ainsley Spinner, PA-C, 30 mg at 09/03/22 0901   ferrous sulfate tablet 325 mg, 325 mg, Oral, Daily, Meuth, Brooke A, PA-C, 325 mg at 09/03/22 0901   gabapentin (NEURONTIN) capsule 300 mg, 300 mg, Oral, QHS, Ainsley Spinner, PA-C, 300 mg at 09/02/22 2119   hydrALAZINE (APRESOLINE) injection 10 mg, 10 mg, Intravenous, Q2H PRN, Ainsley Spinner, PA-C, 10 mg at 08/27/22 2353   HYDROmorphone (DILAUDID) injection 0.5 mg, 0.5 mg, Intravenous, Q4H PRN, Meuth, Brooke A, PA-C, 0.5 mg at 09/02/22 2258   levothyroxine (SYNTHROID) tablet 50 mcg, 50 mcg, Oral, Q0600, Ainsley Spinner, PA-C, 50 mcg at 09/03/22 0530   losartan (COZAAR) tablet 100 mg, 100 mg, Oral, Daily, Meuth, Brooke A, PA-C, 100 mg at 09/03/22 0901   melatonin tablet 5 mg, 5 mg, Oral, QHS PRN, Meuth, Brooke A, PA-C, 5 mg at 09/02/22 2120   methocarbamol (ROBAXIN) tablet 1,000 mg, 1,000 mg, Oral, Q8H, Ainsley Spinner, PA-C, 1,000 mg at 09/03/22 0530   metoprolol tartrate (LOPRESSOR) injection 5 mg, 5 mg, Intravenous, Q6H PRN, Ainsley Spinner, PA-C   ondansetron (ZOFRAN-ODT) disintegrating tablet 4 mg, 4 mg, Oral, Q6H PRN **OR** ondansetron (ZOFRAN) injection 4 mg, 4 mg, Intravenous, Q6H PRN, Ainsley Spinner, PA-C   oxyCODONE (Oxy IR/ROXICODONE) immediate release tablet 10 mg, 10 mg, Oral, Q4H PRN, Ainsley Spinner, PA-C, 10 mg at 09/02/22 2120   oxyCODONE (Oxy IR/ROXICODONE) immediate release tablet 5 mg, 5 mg, Oral, Q4H PRN, Ainsley Spinner, PA-C, 5 mg at 09/03/22 0900   pantoprazole (PROTONIX) EC tablet 40 mg, 40 mg, Oral, Daily, Greer Pickerel, MD, 40 mg at 09/03/22 0901   polyethylene glycol (MIRALAX / GLYCOLAX) packet 17 g, 17 g, Oral, Daily, Meuth, Brooke A, PA-C, 17 g at 09/03/22 0901   propranolol (INDERAL) tablet 10 mg, 10 mg, Oral, BID, Meuth, Brooke A, PA-C, 10 mg at 09/03/22 0901   sodium chloride flush (NS)  0.9 % injection 10-40 mL, 10-40 mL, Intracatheter, Q12H, Meuth, Brooke A, PA-C, 30 mL at 09/03/22 0901   sodium chloride flush (NS) 0.9 % injection 10-40 mL, 10-40 mL, Intracatheter, PRN, Meuth, Brooke A, PA-C  Patients Current Diet:  Diet Order             Diet regular Room service appropriate? Yes; Fluid consistency: Thin  Diet effective now                   Precautions / Restrictions Precautions Precautions: Fall, Other (comment) Precaution Comments: external fixator R UE Restrictions Weight Bearing Restrictions: Yes RUE Weight Bearing: Non weight bearing LUE Weight Bearing: Weight bear through elbow only Other Position/Activity Restrictions: LUE: permitted to use L hand for basic  ADLs and feed self; L UE digit motion as tolerated, no ROM restrictions to L elbow and shoulder. RUE: no lifting with R UE; permitted to move R digits and wrist as tolerated   Has the patient had 2 or more falls or a fall with injury in the past year? Yes  Prior Activity Level Community (5-7x/wk): Pt active in the community PTA  Prior Functional Level Self Care: Did the patient need help bathing, dressing, using the toilet or eating? Independent  Indoor Mobility: Did the patient need assistance with walking from room to room (with or without device)? Independent  Stairs: Did the patient need assistance with internal or external stairs (with or without device)? Independent  Functional Cognition: Did the patient need help planning regular tasks such as shopping or remembering to take medications? Independent  Patient Information Are you of Hispanic, Latino/a,or Spanish origin?: A. No, not of Hispanic, Latino/a, or Spanish origin What is your race?: A. White Do you need or want an interpreter to communicate with a doctor or health care staff?: 0. No  Patient's Response To:  Health Literacy and Transportation Is the patient able to respond to health literacy and transportation needs?:  Yes Health Literacy - How often do you need to have someone help you when you read instructions, pamphlets, or other written material from your doctor or pharmacy?: Never In the past 12 months, has lack of transportation kept you from medical appointments or from getting medications?: No In the past 12 months, has lack of transportation kept you from meetings, work, or from getting things needed for daily living?: No  Home Assistive Devices / Equipment Home Equipment: Conservation officer, nature (2 wheels), [employer] - single point, Control and instrumentation engineer Use: Indicate devices/aids used by the patient prior to current illness, exacerbation or injury? None of the above  Current Functional Level Cognition  Arousal/Alertness: Awake/alert Overall Cognitive Status: Impaired/Different from baseline Current Attention Level: Alternating Orientation Level: Oriented X4 Following Commands: Follows multi-step commands consistently, Follows multi-step commands with increased time Safety/Judgement: Decreased awareness of safety, Decreased awareness of deficits General Comments: Pt with difficulty dual-tasking, making x3 mistakes counting backwards by 3s from 30 and displaying increased instability and R foot drag when ambulating with cognitive challenge. A&Ox4 though and more aware of her deficits and safety today with less repetition of comments noted. Attention: Focused, Sustained Focused Attention: Appears intact Sustained Attention: Impaired Sustained Attention Impairment: Verbal complex Memory: Impaired Memory Impairment:  (Immediate: 5/5 with repetition x5; delayed:) Awareness: Impaired Awareness Impairment: Emergent impairment Problem Solving: Impaired Problem Solving Impairment: Verbal complex (money: 3/3 with repetition; time) Executive Function: Writer: Impaired Organizing Impairment: Verbal complex (backward digit span: 1/2) [employer] Scales of Cognitive Functioning: Purposeful,  Appropriate    Extremity Assessment (includes Sensation/Coordination)  Upper Extremity Assessment: RUE deficits/detail, LUE deficits/detail RUE Deficits / Details: Ex fix immobilizing elbow. Able to perform digit opposition, digit flex/ext, and wrist ROM. RUE: Unable to fully assess due to pain, Unable to fully assess due to immobilization RUE Sensation: WNL RUE Coordination: decreased fine motor, decreased gross motor LUE Deficits / Details: full shoulder ROM, able to wiggle fingers. denies sensation changes. sx planned 9/14 LUE Sensation: WNL LUE Coordination: decreased fine motor  Lower Extremity Assessment: Defer to PT evaluation RLE Deficits / Details: MMT scores of 4- hip flexion (4+ on L), 4 knee extension (5 on L), 5 ankle dorsiflexion (5 on L); denies numbness/tingling bil, able to detect location of touch RLE Sensation: WNL  ADLs  Overall ADL's : Needs assistance/impaired Eating/Feeding: Moderate assistance, Sitting Grooming: Wash/dry hands, Adhering to UE precautions, Cueing for safety, Standing, Min guard Grooming Details (indicate cue type and reason): at the sink Upper Body Bathing: Maximal assistance, Sitting Lower Body Bathing: Maximal assistance, Sit to/from stand Upper Body Dressing : Maximal assistance, Sitting Lower Body Dressing: Maximal assistance, Sit to/from stand Toilet Transfer: Min guard, Ambulation, Regular Toilet Toileting- Clothing Manipulation and Hygiene: Maximal assistance, Sit to/from stand Toileting - Clothing Manipulation Details (indicate cue type and reason): in standing Functional mobility during ADLs: Min guard, Cueing for safety General ADL Comments: pt with great recall of education from prior sessions. close min G for all standing and OOB tasks. increased time needed for safety.    Mobility  Overal bed mobility: Needs Assistance Bed Mobility: Sit to Supine Supine to sit: Min assist, HOB elevated Sit to supine: Min assist General bed  mobility comments: Sitting up in chair upon arrival.    Transfers  Overall transfer level: Needs assistance Equipment used: 1 person hand held assist Transfers: Sit to/from Stand Sit to Stand: Min assist, Mod assist Bed to/from chair/wheelchair/BSC transfer type:: Step pivot Step pivot transfers: Mod assist General transfer comment: Pt recalling to scoot to edge of seat, minA under L elbow to power up to stand and steady transferring from chair the first 3x, but then modA as pt fatigued the final x3 bouts.    Ambulation / Gait / Stairs / Wheelchair Mobility  Ambulation/Gait Ambulation/Gait assistance: Herbalist (Feet): 204 Feet Assistive device: 1 person hand held assist Gait Pattern/deviations: Step-through pattern, Decreased stride length, Wide base of support, Decreased dorsiflexion - right, Decreased step length - right General Gait Details: Pt with slow, small steps, with good heel strike carryover from previous session. Pt cued to look superiorly and not at feet. Improved speed, but still slow. Pt slows speed or displays slightly increased lateral trunk sway when turning head L <> R or looking up <> down. SLight instability when changing speeds. Decreased R step length and foot clearance along with increased instability and slower speed when cued to dual task by counting backwards by 3s from 30 while ambulating. x2 LOB, minA for stability and recovery. Gait velocity: decreased Gait velocity interpretation: <1.31 ft/sec, indicative of household ambulator Pre-gait activities: Marching in place in front of recliner 2x 4-6 reps with modA, LOB due to R knee buckle and trunk sway, needing assistance to recover and cue pt to sit as needed for safety. Cues provided to slow down the weight shift to focus on extending R knee.    Posture / Balance Balance Overall balance assessment: Needs assistance Sitting-balance support: Feet supported, No upper extremity supported Sitting  balance-Leahy Scale: Good Standing balance support: Single extremity supported, No upper extremity supported, During functional activity Standing balance-Leahy Scale: Poor Standing balance comment: Reliant on support    Special needs/care consideration Skin Has right and left arm wounds and dressings in place and Special service needs potentially return to the OR on 09/10/22 for a shoulder replacement.  Hope to DC home after shoulder replacement.   Previous Home Environment (from acute therapy documentation) Living Arrangements: Spouse/significant other  Lives With: Spouse Available Help at Discharge: Family, Available 24 hours/day Type of Home: House Home Layout: Two level, Bed/bath upstairs Alternate Level Stairs-Number of Steps: flight Home Access: Stairs to enter Entrance Stairs-Rails: None Entrance Stairs-Number of Steps: 3 Bathroom Shower/Tub: Multimedia programmer: Standard Bathroom Accessibility: Yes How Accessible: Accessible  via walker Additional Comments: lives with husband for 24/7 support. no bedroom downstairs, 3x lift recliner chairs (she can sleep in) and 1/2 bath downstairs  Discharge Living Setting Plans for Discharge Living Setting: Patient's home Type of Home at Discharge: House Discharge Home Layout: Two level, Bed/bath upstairs Alternate Level Stairs-Rails: None Alternate Level Stairs-Number of Steps: 3 Discharge Home Access: Stairs to enter Entrance Stairs-Rails: None Entrance Stairs-Number of Steps: 3 Discharge Bathroom Shower/Tub: Tub/shower unit Discharge Bathroom Toilet: Standard Discharge Bathroom Accessibility: Yes How Accessible: Accessible via walker  Social/Family/Support Systems Patient Roles: Spouse Contact Information: [phone] Anticipated Caregiver: Migdalia Dk Anticipated Caregiver's Contact Information: [phone] Ability/Limitations of Caregiver: 24/7 Caregiver Availability: 24/7 Discharge Plan Discussed with Primary  Caregiver: Yes Is Caregiver In Agreement with Plan?: Yes Does Caregiver/Family have Issues with Lodging/Transportation while Pt is in Rehab?: Yes  Goals Patient/Family Goal for Rehab: PT/OT/SLP Supervision Expected length of stay: 12-14 days Pt/Family Agrees to Admission and willing to participate: Yes Program Orientation Provided & Reviewed with Pt/Caregiver Including Roles  & Responsibilities: Yes  Decrease burden of Care through IP rehab admission: Specialzed equipment needs, Decrease number of caregivers, Bowel and bladder program, and Patient/family education  Possible need for SNF placement upon discharge: not stated  Patient Condition: I have reviewed medical records from [facility] , spoken with CM, and patient. I met with patient at the bedside for inpatient rehabilitation assessment.  Patient will benefit from ongoing PT, OT, and SLP, can actively participate in 3 hours of therapy a day 5 days of the week, and can make measurable gains during the admission.  Patient will also benefit from the coordinated team approach during an Inpatient Acute Rehabilitation admission.  The patient will receive intensive therapy as well as Rehabilitation physician, nursing, social worker, and care management interventions.  Due to safety, skin/wound care, disease management, medication administration, pain management, and patient education the patient requires 24 hour a day rehabilitation nursing.  The patient is currently min to mod assist with mobility and basic ADLs.  Discharge setting and therapy post discharge at home with home health is anticipated.  Patient has agreed to participate in the Acute Inpatient Rehabilitation Program and will admit today.  Preadmission Screen Completed By:  Retta Diones, 09/03/2022 10:38 AM ______________________________________________________________________   Discussed status with Dr. on 09/03/22 at 0945 and received approval for admission  today.  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 Yes Co-Morbidities requiring supervision/potential complications: wound care, pain mgt, Due to bladder management, bowel management, safety, skin/wound care, disease management, medication administration, pain management, and patient education, does the patient require 24 hr/day rehab nursing? Yes Does the patient require coordinated care of a physician, rehab nurse, PT, OT, and SLP to address physical and functional deficits in the context of the above medical diagnosis(es)? Yes Addressing deficits in the following areas: balance, endurance, locomotion, strength, transferring, bowel/bladder control, bathing, dressing, feeding, grooming, toileting, cognition, and psychosocial support Can the patient actively participate in an intensive therapy program of at least 3 hrs of therapy 5 days a week? Yes The potential for patient to make measurable gains while on inpatient rehab is excellent Anticipated functional outcomes upon discharge from inpatient rehab:  supervision PT, supervision and mod assist OT, modified independent and supervision SLP Estimated rehab length of stay to reach the above functional goals is: 12-14 days Anticipated discharge destination:  back to surgery for right TSA 10. Overall Rehab/Functional Prognosis: good   MD Signature: Meredith Staggers, MD, [place] Director Rehabilitation Services 09/03/2022

## 2022-08-31 LAB — BASIC METABOLIC PANEL
Anion gap: 5 (ref 5–15)
BUN: 12 mg/dL (ref 8–23)
CO2: 25 mmol/L (ref 22–32)
Calcium: 8.3 mg/dL — ABNORMAL LOW (ref 8.9–10.3)
Chloride: 110 mmol/L (ref 98–111)
Creatinine, Ser: 0.62 mg/dL (ref 0.44–1.00)
GFR, Estimated: >60 mL/min (ref >60)
Glucose, Bld: 120 mg/dL — ABNORMAL HIGH (ref 70–99)
Potassium: 3.6 mmol/L (ref 3.5–5.1)
Sodium: 140 mmol/L (ref 135–145)

## 2022-08-31 LAB — CBC
HCT: 24.9 % — ABNORMAL LOW (ref 36.0–46.0)
Hemoglobin: 7.8 g/dL — ABNORMAL LOW (ref 12.0–15.0)
MCH: 31.6 pg (ref 26.0–34.0)
MCHC: 31.3 g/dL (ref 30.0–36.0)
MCV: 100.8 fL — ABNORMAL HIGH (ref 80.0–100.0)
Platelets: 129 10*3/uL — ABNORMAL LOW (ref 150–400)
RBC: 2.47 MIL/uL — ABNORMAL LOW (ref 3.87–5.11)
RDW: 14.6 % (ref 11.5–15.5)
WBC: 3.4 10*3/uL — ABNORMAL LOW (ref 4.0–10.5)
nRBC: 2.3 % — ABNORMAL HIGH (ref 0.0–0.2)

## 2022-08-31 LAB — MAGNESIUM: Magnesium: 2 mg/dL (ref 1.7–2.4)

## 2022-08-31 NOTE — Progress Notes (Signed)
Physical Therapy Treatment Patient Details Name: Vanessa [name] MRN: [id number] DOB: [birth year] Today's Date: 08/31/2022   History of Present Illness Pt is a 71 yo female who presented to [facility] on 08/25/22 after falling down 12 stairs at her home and sustaining SAH, R elbow fx with dislocation, L distal radius and ulna fx, and R proximal humerus fx. S/p right radial head arthroplasty, R elbow LCL repair, R coronoid repair, application of R UE external fixator for persistent instability, and ORIF of L radius and ulna fx 9/14. PMH: HTN, anxiety, depression, arthritis.    PT Comments    The pt was agreeable to session, eager to progress mobility after success with ambulation earlier in the day. The pt was able to make great progress with 2 short bouts of ambulation to the bathroom and around room, remains limited by poor dynamic stability and HR elevation to 140bpm with activity. Will continue to benefit from skilled PT to progress functional endurance and independence with transfers and gait to allow for safest d/c.     Recommendations for follow up therapy are one component of a multi-disciplinary discharge planning process, led by the attending physician.  Recommendations may be updated based on patient status, additional functional criteria and insurance authorization.  Follow Up Recommendations  Acute inpatient rehab (3hours/day) (SNF if not AIR)     Assistance Recommended at Discharge Frequent or constant Supervision/Assistance  Patient can return home with the following Assistance with cooking/housework;Help with stairs or ramp for entrance;Assist for transportation;Direct supervision/assist for financial management;Direct supervision/assist for medications management;A lot of help with bathing/dressing/bathroom;A lot of help with walking and/or transfers   Equipment Recommendations   (defer to post acute)    Recommendations for Other Services       Precautions / Restrictions  Precautions Precautions: Fall Precaution Comments: external fixator R UE Required Braces or Orthoses: Splint/Cast Splint/Cast: external fixator R UE; splint L UE Restrictions Weight Bearing Restrictions: Yes RUE Weight Bearing: Non weight bearing LUE Weight Bearing: Non weight bearing Other Position/Activity Restrictions: LUE: permitted to use L hand for basic ADLs and feed self; L UE digit motion as tolerated, no ROM restrictions to L elbow and shoulder. RUE: no lifting with R UE; permitted to move R digits and wrist as tolerated     Mobility  Bed Mobility               General bed mobility comments: Sitting in recliner upon arrival    Transfers Overall transfer level: Needs assistance Equipment used: None Transfers: Sit to/from Stand Sit to Stand: Min assist, Min guard           General transfer comment: minA progressing to minG with reps to stand from recliner without use of UE. completed x 6 in session. HR to 141bpm after 5x sit-stand    Ambulation/Gait Ambulation/Gait assistance: Min assist Gait Distance (Feet): 15 Feet (+ 20 ft) Assistive device: None Gait Pattern/deviations: Step-through pattern, Decreased stride length, Wide base of support Gait velocity: decreased     General Gait Details: pt with small, wide steps with minimal clearance and minA to steady. No overt LOB but increased sway and at times moving forwards too fast for safety. HR to 140bpm with 20 ft ambulation      Balance Overall balance assessment: Needs assistance Sitting-balance support: Feet supported Sitting balance-Leahy Scale: Good     Standing balance support: During functional activity, Single extremity supported Standing balance-Leahy Scale: Poor Standing balance comment: minA through gait belt  Cognition Arousal/Alertness: Awake/alert Behavior During Therapy: WFL for tasks assessed/performed Overall Cognitive Status: Impaired/Different  from baseline Area of Impairment: Attention, Memory, Following commands, Safety/judgement, Awareness, Problem solving, Rancho level               Rancho Levels of Cognitive Functioning Rancho Los Amigos Scales of Cognitive Functioning: Automatic, Appropriate   Current Attention Level: Sustained Memory: Decreased short-term memory, Decreased recall of precautions Following Commands: Follows one step commands consistently, Follows one step commands with increased time Safety/Judgement: Decreased awareness of deficits Awareness: Emergent Problem Solving: Difficulty sequencing, Requires verbal cues, Slow processing, Requires tactile cues General Comments: Pt very agreeable to therapy and motivated. Requiring increased time for processing thorughout. Decreased memory and asking repeated questions at times, able to follow basic cues   [facility] Scales of Cognitive Functioning: Automatic, Appropriate    Exercises Other Exercises Other Exercises: 5x sit-stand from recliner without use of UE    General Comments General comments (skin integrity, edema, etc.): HR to 140bpm with sit-stand and 20 ft gait. recovers to 100s with seated rest      Pertinent Vitals/Pain Pain Assessment Pain Assessment: No/denies pain Pain Intervention(s): Monitored during session     PT Goals (current goals can now be found in the care plan section) Acute Rehab PT Goals Patient Stated Goal: to be able to go to the bathroom by herself PT Goal Formulation: With patient/family Time For Goal Achievement: 09/09/22 Potential to Achieve Goals: Good Progress towards PT goals: Progressing toward goals    Frequency    Min 5X/week      PT Plan Current plan remains appropriate       AM-PAC PT "6 Clicks" Mobility   Outcome Measure  Help needed turning from your back to your side while in a flat bed without using bedrails?: A Little Help needed moving from lying on your back to sitting on the side  of a flat bed without using bedrails?: A Little Help needed moving to and from a bed to a chair (including a wheelchair)?: A Little Help needed standing up from a chair using your arms (e.g., wheelchair or bedside chair)?: A Little Help needed to walk in hospital room?: A Little Help needed climbing 3-5 steps with a railing? : A Lot 6 Click Score: 17    End of Session Equipment Utilized During Treatment: Gait belt Activity Tolerance: Patient tolerated treatment well Patient left: in chair;with call bell/phone within reach;with chair alarm set;with family/visitor present Nurse Communication: Mobility status PT Visit Diagnosis: Unsteadiness on feet (R26.81);Other abnormalities of gait and mobility (R26.89);Muscle weakness (generalized) (M62.81);Difficulty in walking, not elsewhere classified (R26.2);Pain Pain - Right/Left: Right Pain - part of body: Arm     Time: [id number] PT Time Calculation (min) (ACUTE ONLY): 26 min  Charges:  $Gait Training: 8-22 mins $Therapeutic Exercise: 8-22 mins                     [place] Carbo, PT, DPT   Acute Rehabilitation Department   Sandra Cockayne 08/31/2022, 6:23 PM

## 2022-08-31 NOTE — Progress Notes (Signed)
Inpatient Rehab Admissions Coordinator:   Pt to OR possibly today for revision of elbow. CIR will follow up for potential admit afterwards.  Clemens Catholic, [place], [place] Admissions Coordinator  [phone] ([place]) [phone] (office)

## 2022-08-31 NOTE — Consult Note (Signed)
ORTHOPAEDIC CONSULTATION  REQUESTING PHYSICIAN: Md, Trauma, MD  PCP:  Richardean Chimera, MD  Chief Complaint: Right proximal humerus fracture  HPI: Vanessa [name] is a 71 y.o. female who complains of Right upper extremity pain following a fall during a nighttime bathroom break where she was found at the bottom of 12-14 stairs.  She had immediate deformity of right upper extremity as well as pain in the left upper extremity.  She has had management by the trauma service as well as fracture surgery already by my orthopedic trauma colleague Dr. Daneil Dolin.  I was asked to evaluate the patient's right proximal humerus fracture for potential need of arthroplasty given the degree of comminution.  She is currently complaining of shoulder pain but is managed well.  She denies numbness or tingling.  She is right-hand-dominant.  She denies smoking or diabetes.  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  Past Medical History:  Diagnosis Date   Anxiety    Arthritis    Depression    Hypertension    Past Surgical History:  Procedure Laterality Date   FRACTURE SURGERY     Social History   Socioeconomic History   Marital status: Married    Spouse name: Not on file   Number of children: Not on file   Years of education: Not on file   Highest education level: Not on file  Occupational History   Not on file  Tobacco Use   Smoking status: Never   Smokeless tobacco: Never  Substance and Sexual Activity   Alcohol use: No    Alcohol/week: 0.0 standard drinks of alcohol   Drug use: No   Sexual activity: Yes    Birth control/protection: None  Other Topics Concern   Not on file  Social History Narrative   Not on file   Social Determinants of Health   Financial Resource Strain: Not on file  Food Insecurity: Not on file  Transportation Needs: Not on file  Physical Activity: Not on file  Stress: Not on file  Social Connections: Not on file   Family History  Problem  Relation Age of Onset   Cancer Mother    Allergies  Allergen Reactions   Ace Inhibitors Cough   Prior to Admission medications   Medication Sig Start Date End Date Taking? Authorizing Provider  atorvastatin (LIPITOR) 20 MG tablet Take 20 mg by mouth daily. 05/29/22  Yes [provider]  DULoxetine (CYMBALTA) 60 MG capsule Take 60 mg by mouth daily. 08/02/22  Yes [provider]  famotidine (PEPCID) 40 MG tablet Take 40 mg by mouth daily. 05/29/22  Yes [provider]  gabapentin (NEURONTIN) 300 MG capsule Take 600 mg by mouth at bedtime. 05/29/22  Yes [provider]  levothyroxine (SYNTHROID) 50 MCG tablet Take 50 mcg by mouth daily. 05/29/22  Yes [provider]  losartan (COZAAR) 100 MG tablet Take 100 mg by mouth daily. 05/29/22  Yes [provider]  OVER THE COUNTER MEDICATION Apply 1 Application topically as needed (For back pain).   Yes [provider]  Polyethyl Glycol-Propyl Glycol (SYSTANE OP) Place 1-2 drops into both eyes daily as needed (Dry eyes).   Yes [provider]  propranolol (INDERAL) 10 MG tablet Take 10 mg by mouth 2 (two) times daily. 05/29/22  Yes [provider]  zaleplon (SONATA) 10 MG capsule Take 10 mg by mouth at bedtime. 08/03/22  Yes [provider]   CT  ELBOW RIGHT WO CONTRAST  Result Date: 08/30/2022 CLINICAL DATA:  Fracture, elbow posterior radial head arthroplasty with persistent instability. EXAM: CT OF THE UPPER RIGHT EXTREMITY WITHOUT CONTRAST TECHNIQUE: Multidetector CT imaging of the upper right extremity was performed according to the standard protocol. RADIATION DOSE REDUCTION: This exam was performed according to the departmental dose-optimization program which includes automated exposure control, adjustment of the mA and/or kV according to patient size and/or use of iterative reconstruction technique. COMPARISON:  08/27/2022, 08/25/2022. FINDINGS: Bones/Joint/Cartilage  Examination is limited due to motion artifact. Radial head arthroplasty changes are noted. There is redemonstration of fracture fragments at the lateral humeral condyle, unchanged from 08/25/2022. No dislocation at the elbow. External fixation hardware is noted in the humerus and ulna without evidence of hardware loosening. No joint effusion. Ligaments Suboptimally assessed by CT. Muscles and Tendons No acute abnormality. Soft tissues Subcutaneous fat stranding and edema are noted in the right lower extremity. Fluid is noted posterior to the elbow and proximal forearm. A tiny focus of subcutaneous air is noted along the posterior aspect of the proximal ulna, which was present on 08/17/2022 and likely related to recent surgery. IMPRESSION: 1. Limited evaluation due to patient's body habitus and motion artifact. 2. Status post radial head arthroplasty with no evidence of dislocation. 3. Nondisplaced fracture of the lateral humeral condyle, which was seen 08/25/2022. 4. Subcutaneous fat stranding, fluid, and foci of air along the posterior aspect of the proximal forearm, likely related to recent surgery. Electronically Signed   By: Thornell Sartorius M.D.   On: 08/30/2022 22:50    Positive ROS: All other systems have been reviewed and were otherwise negative with the exception of those mentioned in the HPI and as above.  Physical Exam: General: Alert, no acute distress Cardiovascular: No pedal edema Respiratory: No cyanosis, no use of accessory musculature GI: No organomegaly, abdomen is soft and non-tender Skin: No lesions in the area of chief complaint Neurologic: Sensation intact distally Psychiatric: Patient is competent for consent with normal mood and affect Lymphatic: No axillary or cervical lymphadenopathy  MUSCULOSKELETAL: Right upper extremity is in a sling and she has an external fixator on the elbow.  Pin sites are clean but wrapped with Ace bandage.  Distally neurovascular intact in the hand.  At  the shoulder sensation intact and motor intact in the axillary nerve.  Assessment: 1/Comminuted right four-part proximal humerus fracture with head splitting involvement 2. Inferior glenoid rim fracture, right closed.  Plan: - Together along with her family at the bedside we discussed the nature of her right proximal humerus fracture.  This is a significant injury and that involves the humeral head as well as significant displacement and involvement of the proximal humerus tuberosities.  She also has a inferior fracture of the glenoid which I do believe will not disrupt recommendation for arthroplasty.  She would be in need of a reverse arthroplasty given the involvement of the tuberosities.  We discussed risk and benefits of the procedure as well as the recommendation to wait approximately 2 weeks to allow soft tissue recovery and wounds to heal from the previous surgery already performed.  We will tentatively get her scheduled for September 28 which is a Thursday over at [facility].  If need be we can check availability at [facility] but in terms of scheduling with me that would be the easier option to have her at [facility].  She may discharge home first and come back for that surgery if necessary.  We  will follow along and see how she progresses on the floor.  - We discussed all risk and benefits of the procedure including but not limited to bleeding, infection, damage to surrounding nerves and vessels, dislocation, fracture, need for revision surgery, persistent pain and weakness as well as stiffness and the risk of anesthesia.  She has provided informed consent.    Nicholes Stairs, MD Cell [phone]    08/31/2022 12:29 AM

## 2022-08-31 NOTE — Progress Notes (Signed)
Mobility Specialist Progress Note   08/31/22 1830  Mobility  Activity Ambulated with assistance in room  Level of Assistance Minimal assist, patient does 75% or more  Assistive Device Other (Comment) (HHA)  RUE Weight Bearing NWB  LUE Weight Bearing NWB  Distance Ambulated (ft) 16 ft  Activity Response Tolerated well  $Mobility charge 1 Mobility   Pre Mobility: 103 HR, 117/80 BP, 100% SpO2 During Mobility: 134 HR, 96% SpO2 Post Mobility: 112 HR, 98% SpO2  Received in chair having minimal pain in RUE but agreeable. Pt demonstrating good adherence to WB precautions through recall. Pt requesting to use BR before ambulation, MinA to stand but contact guard for transfer to BR. Successful BM and void. Pt's HR steadily rising, session cut short d/t Afib and complaint of fatigue. Returned back to chair w/o fault, call bell in reach and all needs met. RN notified.  Holland Falling Mobility Specialist MS [facility] #:  [phone] Acute Rehab Office:  [phone]

## 2022-08-31 NOTE — Progress Notes (Addendum)
Patient ID: MARLOE TRUDO, female   DOB: 1951-01-08, 71 y.o.   MRN: [place]:[id number] 4 Days Post-Op    Subjective: Reports she thinks she is going to the OR with Dr. Marcelino Scot today Otherwise doing well. +BM, C/O LUE splint feels tight ROS negative except as listed above. Objective: Vital signs in last 24 hours: Temp:  [97.7 F (36.5 C)-98.5 F (36.9 C)] 98 F (36.7 C) (09/18 1121) Pulse Rate:  [90-112] 96 (09/18 1121) Resp:  [13-19] 13 (09/18 1121) BP: (114-159)/(59-74) 159/74 (09/18 1121) SpO2:  [95 %-100 %] 100 % (09/18 1121) Last BM Date : 08/24/22  Intake/Output from previous day: 09/17 0701 - 09/18 0700 In: -  Out: 1300 [Urine:1300] Intake/Output this shift: Total I/O In: -  Out: 1400 [Urine:1400]  General appearance: alert and cooperative Resp: clear to auscultation bilaterally Cardio: regular rate and rhythm GI: soft, NT, ND Extremities: ex fix RUE, splint LUE Neurologic: Mental status: Alert, oriented, thought content appropriate  Lab Results: CBC  Recent Labs    08/30/22 0510 08/31/22 0650  WBC 3.8* 3.4*  HGB 7.5* 7.8*  HCT 22.7* 24.9*  PLT 121* 129*   BMET Recent Labs    08/30/22 0510 08/31/22 0650  NA 141 140  K 3.4* 3.6  CL 108 110  CO2 25 25  GLUCOSE 106* 120*  BUN 14 12  CREATININE 0.67 0.62  CALCIUM 8.4* 8.3*   PT/INR No results for input(s): "LABPROT", "INR" in the last 72 hours. ABG No results for input(s): "PHART", "HCO3" in the last 72 hours.  Invalid input(s): "PCO2", "PO2"  Studies/Results: CT ELBOW RIGHT WO CONTRAST  Result Date: 08/30/2022 CLINICAL DATA:  Fracture, elbow posterior radial head arthroplasty with persistent instability. EXAM: CT OF THE UPPER RIGHT EXTREMITY WITHOUT CONTRAST TECHNIQUE: Multidetector CT imaging of the upper right extremity was performed according to the standard protocol. RADIATION DOSE REDUCTION: This exam was performed according to the departmental dose-optimization program which includes automated  exposure control, adjustment of the mA and/or kV according to patient size and/or use of iterative reconstruction technique. COMPARISON:  08/27/2022, 08/25/2022. FINDINGS: Bones/Joint/Cartilage Examination is limited due to motion artifact. Radial head arthroplasty changes are noted. There is redemonstration of fracture fragments at the lateral humeral condyle, unchanged from 08/25/2022. No dislocation at the elbow. External fixation hardware is noted in the humerus and ulna without evidence of hardware loosening. No joint effusion. Ligaments Suboptimally assessed by CT. Muscles and Tendons No acute abnormality. Soft tissues Subcutaneous fat stranding and edema are noted in the right lower extremity. Fluid is noted posterior to the elbow and proximal forearm. A tiny focus of subcutaneous air is noted along the posterior aspect of the proximal ulna, which was present on 08/17/2022 and likely related to recent surgery. IMPRESSION: 1. Limited evaluation due to patient's body habitus and motion artifact. 2. Status post radial head arthroplasty with no evidence of dislocation. 3. Nondisplaced fracture of the lateral humeral condyle, which was seen 08/25/2022. 4. Subcutaneous fat stranding, fluid, and foci of air along the posterior aspect of the proximal forearm, likely related to recent surgery. Electronically Signed   By: Brett Fairy M.D.   On: 08/30/2022 22:50    Anti-infectives: Anti-infectives (From admission, onward)    Start     Dose/Rate Route Frequency Ordered Stop   08/27/22 2300  ceFAZolin (ANCEF) IVPB 2g/100 mL premix        2 g 200 mL/hr over 30 Minutes Intravenous Every 8 hours 08/27/22 2210 08/28/22 2159   08/27/22  1300  ceFAZolin (ANCEF) IVPB 2g/100 mL premix        2 g 200 mL/hr over 30 Minutes Intravenous On call to O.R. 08/26/22 1111 08/27/22 1914       Assessment/Plan: Fall down stairs   SDH, SAH - NSGY c/s, keppra for sz ppx x7 days, repeat CT negative. SLP eval completed R prox  humerus fx - ortho evaluated and recommend total arthoplasty with Dr. Stann Mainland - scheduled 9/28 at [facility] elbow fx/dislocation - s/p OR with Dr. Marcelino Scot 9/14. Got CT of the elbow 9/17 to see if she needs further OR. NWB RUE; possible revision surgery 9/18 Left wrist fx - s/p ORIF 9/14 Dr. Marcelino Scot. NWB LUE ABLA - Hb 7.8, monitor, will type &screen done in case needs TF with OR FEN - SLIV, reg diet, NPO for possible OR, K better, bowel regimen working DVT - SCDs, LMWH Foley - d/c 9/15   Dispo - TBI team therapies. Possible OR with Dr. Marcelino Scot. Possible eventual CIR. I also spoke with her daughter at the bedside Moderate MDM   LOS: 6 days    Georganna Skeans, MD, MPH, FACS Trauma & General Surgery Use [email or web address] to contact on call provider  08/31/2022

## 2022-09-01 LAB — CBC
HCT: 26.2 % — ABNORMAL LOW (ref 36.0–46.0)
Hemoglobin: 8.2 g/dL — ABNORMAL LOW (ref 12.0–15.0)
MCH: 31.5 pg (ref 26.0–34.0)
MCHC: 31.3 g/dL (ref 30.0–36.0)
MCV: 100.8 fL — ABNORMAL HIGH (ref 80.0–100.0)
Platelets: 146 10*3/uL — ABNORMAL LOW (ref 150–400)
RBC: 2.6 MIL/uL — ABNORMAL LOW (ref 3.87–5.11)
RDW: 15.3 % (ref 11.5–15.5)
WBC: 3.8 10*3/uL — ABNORMAL LOW (ref 4.0–10.5)
nRBC: 1.3 % — ABNORMAL HIGH (ref 0.0–0.2)

## 2022-09-01 NOTE — Progress Notes (Signed)
Inpatient Rehab Admissions Coordinator:    Pt.'s elbow surgery to be in 4-6 weeks. Discussed potentially admitting Pt. In the next few days for maybe a week prior to shoulder surgery on the 28th vs waiting to admit after. Pt. Would prefer to come before even if that means she gets a fewer days on CIR. Additionally, I spoke with Dr. Corine Shelter and he stated that he could potentially postpone Pt.'s shoulder surgery a few days if needed. I will open insurance today.  Clemens Catholic, [place], [place] Admissions Coordinator  [phone] ([place]) [phone] (office)

## 2022-09-01 NOTE — Progress Notes (Addendum)
Patient ID: Vanessa [name], female   DOB: Mar 26, 1951, 71 y.o.   MRN: [id number] 5 Days Post-Op    Subjective: Did not sleep well ROS negative except as listed above. Objective: Vital signs in last 24 hours: Temp:  [98 F (36.7 C)-98.7 F (37.1 C)] 98.4 F (36.9 C) (09/19 0705) Pulse Rate:  [95-109] 95 (09/19 0705) Resp:  [13-19] 17 (09/19 0705) BP: (128-159)/(68-81) 149/68 (09/19 0705) SpO2:  [95 %-100 %] 97 % (09/19 0705) Last BM Date : 08/31/22  Intake/Output from previous day: 09/18 0701 - 09/19 0700 In: 240 [P.O.:240] Out: 2500 [Urine:2500] Intake/Output this shift: Total I/O In: -  Out: 900 [Urine:900]  General appearance: alert and cooperative Resp: clear to auscultation bilaterally Cardio: regular rate and rhythm GI: soft, NT Extremities: ex fix RUE, L wrist splint, fingers warm  Lab Results: CBC  Recent Labs    08/31/22 0650 09/01/22 0144  WBC 3.4* 3.8*  HGB 7.8* 8.2*  HCT 24.9* 26.2*  PLT 129* 146*   BMET Recent Labs    08/30/22 0510 08/31/22 0650  NA 141 140  K 3.4* 3.6  CL 108 110  CO2 25 25  GLUCOSE 106* 120*  BUN 14 12  CREATININE 0.67 0.62  CALCIUM 8.4* 8.3*   PT/INR No results for input(s): "LABPROT", "INR" in the last 72 hours. ABG No results for input(s): "PHART", "HCO3" in the last 72 hours.  Invalid input(s): "PCO2", "PO2"  Studies/Results: CT ELBOW RIGHT WO CONTRAST  Result Date: 08/30/2022 CLINICAL DATA:  Fracture, elbow posterior radial head arthroplasty with persistent instability. EXAM: CT OF THE UPPER RIGHT EXTREMITY WITHOUT CONTRAST TECHNIQUE: Multidetector CT imaging of the upper right extremity was performed according to the standard protocol. RADIATION DOSE REDUCTION: This exam was performed according to the departmental dose-optimization program which includes automated exposure control, adjustment of the mA and/or kV according to patient size and/or use of iterative reconstruction technique. COMPARISON:  08/27/2022,  08/25/2022. FINDINGS: Bones/Joint/Cartilage Examination is limited due to motion artifact. Radial head arthroplasty changes are noted. There is redemonstration of fracture fragments at the lateral humeral condyle, unchanged from 08/25/2022. No dislocation at the elbow. External fixation hardware is noted in the humerus and ulna without evidence of hardware loosening. No joint effusion. Ligaments Suboptimally assessed by CT. Muscles and Tendons No acute abnormality. Soft tissues Subcutaneous fat stranding and edema are noted in the right lower extremity. Fluid is noted posterior to the elbow and proximal forearm. A tiny focus of subcutaneous air is noted along the posterior aspect of the proximal ulna, which was present on 08/17/2022 and likely related to recent surgery. IMPRESSION: 1. Limited evaluation due to patient's body habitus and motion artifact. 2. Status post radial head arthroplasty with no evidence of dislocation. 3. Nondisplaced fracture of the lateral humeral condyle, which was seen 08/25/2022. 4. Subcutaneous fat stranding, fluid, and foci of air along the posterior aspect of the proximal forearm, likely related to recent surgery. Electronically Signed   By: Thornell Sartorius M.D.   On: 08/30/2022 22:50    Anti-infectives: Anti-infectives (From admission, onward)    Start     Dose/Rate Route Frequency Ordered Stop   08/27/22 2300  ceFAZolin (ANCEF) IVPB 2g/100 mL premix        2 g 200 mL/hr over 30 Minutes Intravenous Every 8 hours 08/27/22 2210 08/28/22 2159   08/27/22 1300  ceFAZolin (ANCEF) IVPB 2g/100 mL premix        2 g 200 mL/hr over 30 Minutes Intravenous On  call to O.R. 08/26/22 1111 08/27/22 1914       Assessment/Plan: Fall down stairs   SDH, SAH - NSGY c/s, keppra for sz ppx x7 days, repeat CT negative. SLP eval completed R prox humerus fx - ortho evaluated and recommend total arthoplasty with Dr. Stann Mainland - scheduled 9/28 at [facility] elbow fx/dislocation - s/p OR with  Dr. Marcelino Scot 9/14. Got CT of the elbow 9/17. Plan now is ex fix in place 4-6 weeks Left wrist fx - s/p ORIF 9/14 Dr. Marcelino Scot. NWB LUE ABLA - Hb 8.2 FEN - SLIV, reg diet DVT - SCDs, LMWH Foley - d/c 9/15   Dispo - TBI team therapies. Plan CIR followed by R total shoulder arthroplasty by Dr. Stann Mainland - currently scheduled 9/28 but could possibly reschedule to allow completion of CIR.  I also spoke with her husband at the bedside. I D/W TOC team. Moderate MDM.  LOS: 7 days    Georganna Skeans, MD, MPH, FACS Trauma & General Surgery Use [email or web address] to contact on call provider  09/01/2022

## 2022-09-01 NOTE — Progress Notes (Signed)
Physical Therapy Treatment Patient Details Name: Vanessa [name] MRN: [id number] DOB: 01/20/1951 Today's Date: 09/01/2022   History of Present Illness Pt is a 71 yo female who presented to [facility] on [facility]/22 after falling down 12 stairs at her home and sustaining SAH, R elbow fx with dislocation, L distal radius and ulna fx, and R proximal humerus fx. S/p right radial head arthroplasty, R elbow LCL repair, R coronoid repair, application of R UE external fixator for persistent instability, and ORIF of L radius and ulna fx 9/14. PMH: HTN, anxiety, depression, arthritis.    PT Comments    Patient progressing well towards PT goals. Session focused on ADLs, functional mobility and ambulation. Cognition seems to be improving- aware she was sitting in urine, the plan for no surgery today after talking to MD last night and able to state precautions of UEs correctly and state when she was doing something she should not be. Requires more assist to stand from EOB today-Mod A and Min A for ambulation within room due to imbalance. HR up to 136 bpm with activity. Assisted pt with clean up in bathroom and needs Min A for standing balance. Continues to be a great AIR candidate. Will follow.   Recommendations for follow up therapy are one component of a multi-disciplinary discharge planning process, led by the attending physician.  Recommendations may be updated based on patient status, additional functional criteria and insurance authorization.  Follow Up Recommendations  Acute inpatient rehab (3hours/day)     Assistance Recommended at Discharge Frequent or constant Supervision/Assistance  Patient can return home with the following Assistance with cooking/housework;Help with stairs or ramp for entrance;Assist for transportation;Direct supervision/assist for financial management;Direct supervision/assist for medications management;A lot of help with bathing/dressing/bathroom;A lot of help with walking and/or  transfers   Equipment Recommendations  Other (comment) (defer to post acute)    Recommendations for Other Services       Precautions / Restrictions Precautions Precautions: Fall;Other (comment) Precaution Comments: external fixator R UE Required Braces or Orthoses: Splint/Cast Splint/Cast: external fixator R UE; splint L UE Splint/Cast - Date Prophylactic Dressing Applied (if applicable): 98/33/82 Restrictions Weight Bearing Restrictions: Yes RUE Weight Bearing: Non weight bearing LUE Weight Bearing: Non weight bearing Other Position/Activity Restrictions: LUE: permitted to use L hand for basic ADLs and feed self; L UE digit motion as tolerated, no ROM restrictions to L elbow and shoulder. RUE: no lifting with R UE; permitted to move R digits and wrist as tolerated     Mobility  Bed Mobility Overal bed mobility: Needs Assistance Bed Mobility: Supine to Sit     Supine to sit: Min assist, HOB elevated     General bed mobility comments: Assist with trunk to get to EOB.    Transfers Overall transfer level: Needs assistance Equipment used: None Transfers: Sit to/from Stand Sit to Stand: Mod assist, Min assist           General transfer comment: Mod A to power to standing with cues for anterior weight shift from low bed height, MIn A from toilet, transferre to chair post ambulation.    Ambulation/Gait Ambulation/Gait assistance: Min assist Gait Distance (Feet): 15 Feet (+ 20') Assistive device: 1 person hand held assist Gait Pattern/deviations: Step-through pattern, Decreased stride length, Wide base of support Gait velocity: decreased Gait velocity interpretation: <1.31 ft/sec, indicative of household ambulator   General Gait Details: pt with slow, small, wide steps with minimal clearance and minA to steady. HR to 136 bpm.  Stairs             Wheelchair Mobility    Modified Rankin (Stroke Patients Only)       Balance Overall balance assessment:  Needs assistance Sitting-balance support: Feet supported, No upper extremity supported Sitting balance-Leahy Scale: Good     Standing balance support: During functional activity Standing balance-Leahy Scale: Poor Standing balance comment: minA through gait belt, stood for assist with wash up                            Cognition Arousal/Alertness: Awake/alert Behavior During Therapy: WFL for tasks assessed/performed Overall Cognitive Status: Impaired/Different from baseline Area of Impairment: Following commands, Safety/judgement, Awareness, Problem solving, Rancho level, Attention, Memory               Rancho Levels of Cognitive Functioning Rancho Los Amigos Scales of Cognitive Functioning: Automatic, Appropriate   Current Attention Level: Selective Memory: Decreased short-term memory Following Commands: Follows one step commands consistently, Follows one step commands with increased time Safety/Judgement: Decreased awareness of deficits Awareness: Emergent Problem Solving: Difficulty sequencing, Requires verbal cues, Slow processing General Comments: A&Ox3, aware she is sitting in urine soaked bed and wanting to get cleaned up. Able to state precautions of UEs today correctly and aware she is not going to have surgery today. Seems to be improving.   Rancho [employer] Scales of Cognitive Functioning: Automatic, Appropriate    Exercises      General Comments General comments (skin integrity, edema, etc.): HR up to 136 bpm with activity in room.      Pertinent Vitals/Pain Pain Assessment Pain Assessment: Faces Faces Pain Scale: Hurts even more Pain Location: right shoulder/UE Pain Descriptors / Indicators: Discomfort, Guarding, Operative site guarding Pain Intervention(s): Monitored during session, Repositioned    Home Living                          Prior Function            PT Goals (current goals can now be found in the care plan  section) Progress towards PT goals: Progressing toward goals    Frequency    Min 5X/week      PT Plan Current plan remains appropriate    Co-evaluation              AM-PAC PT "6 Clicks" Mobility   Outcome Measure  Help needed turning from your back to your side while in a flat bed without using bedrails?: A Little Help needed moving from lying on your back to sitting on the side of a flat bed without using bedrails?: A Little Help needed moving to and from a bed to a chair (including a wheelchair)?: A Lot Help needed standing up from a chair using your arms (e.g., wheelchair or bedside chair)?: A Lot Help needed to walk in hospital room?: A Little Help needed climbing 3-5 steps with a railing? : A Lot 6 Click Score: 15    End of Session Equipment Utilized During Treatment: Gait belt Activity Tolerance: Patient tolerated treatment well Patient left: in chair;with call bell/phone within reach;with chair alarm set Nurse Communication: Mobility status PT Visit Diagnosis: Unsteadiness on feet (R26.81);Other abnormalities of gait and mobility (R26.89);Muscle weakness (generalized) (M62.81);Difficulty in walking, not elsewhere classified (R26.2);Pain Pain - Right/Left: Right Pain - part of body: Arm     Time: 8676-1950 PT Time Calculation (min) (ACUTE ONLY): 28 min  Charges:  $Therapeutic Activity: 23-37 mins                     Vale Haven, PT, DPT Acute Rehabilitation Services Secure chat preferred Office [phone]      Blake Divine A Jonathan Kirkendoll 09/01/2022, 9:22 AM

## 2022-09-01 NOTE — Progress Notes (Signed)
Orthopaedic Trauma Service Progress Note  Patient ID: Vanessa [name] MRN: [id number] DOB/AGE: 69/22/1952 69 y.o.  Subjective:  Doing ok  Some pain in R elbow today but not too bad   No other complaints  Discussed case with upper extremity colleague at [facility]. No revision surgery planned for this admission. Ex fix x 4-6 weeks. Possible capsulectomy 9 months down the road    ROS As above  Objective:   VITALS:   Vitals:   08/31/22 1921 08/31/22 2257 09/01/22 0300 09/01/22 0705  BP: 128/71 (!) 147/77 (!) 158/81 (!) 149/68  Pulse: (!) 109 (!) 103 97 95  Resp: 19 15 15 17   Temp: 98.5 F (36.9 C) 98.7 F (37.1 C) 98 F (36.7 C) 98.4 F (36.9 C)  TempSrc: Oral Axillary Oral Oral  SpO2: 100% 100% 95% 97%  Weight:      Height:        Estimated body mass index is 32.28 kg/m as calculated from the following:   Height as of this encounter: 5\' 6"  (1.676 m).   Weight as of this encounter: 90.7 kg.   Intake/Output      09/18 0701 09/19 0700 09/19 0701 09/20 0700   P.O. 240    Total Intake(mL/kg) 240 (2.6)    Urine (mL/kg/hr) 2500 (1.1) 900 (3.4)   Stool 0    Total Output 2500 900   Net -2260 -900        Urine Occurrence 1 x    Stool Occurrence 2 x      LABS  Results for orders placed or performed during the hospital encounter of 08/25/22 (from the past 24 hour(s))  CBC     Status: Abnormal   Collection Time: 09/01/22  1:44 AM  Result Value Ref Range   WBC 3.8 (L) 4.0 - 10.5 K/uL   RBC 2.60 (L) 3.87 - 5.11 MIL/uL   Hemoglobin 8.2 (L) 12.0 - 15.0 g/dL   HCT 10/25/22 (L) 09/03/22 - 32.9 %   MCV 100.8 (H) 80.0 - 100.0 fL   MCH 31.5 26.0 - 34.0 pg   MCHC 31.3 30.0 - 36.0 g/dL   RDW 51.8 84.1 - 66.0 %   Platelets 146 (L) 150 - 400 K/uL   nRBC 1.3 (H) 0.0 - 0.2 %     PHYSICAL EXAM:   Gen: resting comfortably in bedside chair, NAD, appears well, very pleasant, husband present Lungs: unlabored   Cardiac: reg Ext:       Right Upper Extremity              External fixator right elbow is intact                         Dressings are stable                         Pin sites look good             Radial, ulnar, median motor and sensory functions intact             Axillary nerve motor and sensory functions grossly intact             AIN and PIN motor intact  Ext warm              Brisk cap refill              Swelling stable        Left upper extremity              Volar splint intact but getting a little loose              Moderate swelling              Radial, ulnar, median motor and sensory functions intact             AiN and PIN motor intact             No pain out of proportion with passive stretching              Brisk cap refill              Good perfusion distally   Assessment/Plan: 5 Days Post-Op     Anti-infectives (From admission, onward)    Start     Dose/Rate Route Frequency Ordered Stop   08/27/22 2300  ceFAZolin (ANCEF) IVPB 2g/100 mL premix        2 g 200 mL/hr over 30 Minutes Intravenous Every 8 hours 08/27/22 2210 08/28/22 2159   08/27/22 1300  ceFAZolin (ANCEF) IVPB 2g/100 mL premix        2 g 200 mL/hr over 30 Minutes Intravenous On call to O.R. 08/26/22 1111 08/27/22 1914     .  POD/HD#: 26  71 year old right-hand-dominant female fall down approximately 12 stairs, polytrauma   -Fall down 12 stairs   - R proximal humerus fracture             High comminuted             Recommend total shoulder arthoplasty              Dr. Rogers---> total shoulder arthroplasty, on schedule for 09/10/2022   - R elbow fracture dislocation ---> terrible triad type pattern s/p right radial head arthroplasty, LCL repair, coronoid repair, application of external fixator for persistent instability               Nonweightbearing right upper extremity             Okay to move digits and wrist as tolerated             No lifting with right upper  extremity               ex fix x 4-6 weeks    -Left distal radius and ulna fracture s/p ORIF             Nonweightbearing to left wrist             Okay to use left hand for basic ADLs and to feed self             No axial loading of the left wrist             Ice and elevate             Digit motion as tolerated             No motion restrictions to elbow and shoulder               splint for another 5 days or so then new brace      -  Pain management:               Multimodal   - ABL anemia/Hemodynamics               Stable  - Medical issues                For trauma   - Metabolic Bone Disease:               Vitamin D levels are low normal   - Dispo:             Ortho trauma issues addressed  Reverse total shoulder arthroplasty for R proximal humerus fracture planned for 09/10/2022 by dr Stann Mainland   Jari Pigg, PA-C [phone] (C) 09/01/2022, 9:54 AM  [facility] [place] [phone] Jenetta [phone] (F)    After 5pm and on the weekends please log on to Amion, go to orthopaedics and the look under the [facility] Call for the provider(s) on call. You can also call our office at [phone] and then follow the prompts to be connected to the call team.   Patient ID: Vanessa [name], female   DOB: 1951/02/07, 71 y.o.   MRN: [id number]

## 2022-09-01 NOTE — Progress Notes (Signed)
Occupational Therapy Treatment Patient Details Name: Vanessa [name] MRN: [id number] DOB: 10-16-1951 Today's Date: 09/01/2022   History of present illness Pt is a 71 yo female who presented to [facility] on 08/25/22 after falling down 12 stairs at her home and sustaining SAH, R elbow fx with dislocation, L distal radius and ulna fx, and R proximal humerus fx. S/p right radial head arthroplasty, R elbow LCL repair, R coronoid repair, application of R UE external fixator for persistent instability, and ORIF of L radius and ulna fx 9/14. PMH: HTN, anxiety, depression, arthritis.   OT comments  Pt is progressing well towards her acute goals. Pt was able to recall all BUE precautions with minimal cues, and maintained during toileting, grooming and mobility. Pt requires increased time for sit<>stand transfers but was able to complete 2x this date with min G for safety only. Close min G given for functional ambulation as pt maintains a short shuffling gait, pt does become more unsteady with a natural step through pattern. She continues to need Max A for hygiene due to BUE status. POC remains appropriate for maximal functional recovery towards mod I.   Recommendations for follow up therapy are one component of a multi-disciplinary discharge planning process, led by the attending physician.  Recommendations may be updated based on patient status, additional functional criteria and insurance authorization.    Follow Up Recommendations  Acute inpatient rehab (3hours/day)    Assistance Recommended at Discharge Frequent or constant Supervision/Assistance  Patient can return home with the following  A little help with walking and/or transfers;A lot of help with bathing/dressing/bathroom;Assistance with cooking/housework;Assistance with feeding;Direct supervision/assist for medications management;Direct supervision/assist for financial management;Help with stairs or ramp for entrance;Assist for transportation    Equipment Recommendations  BSC/3in1    Recommendations for Other Services      Precautions / Restrictions Precautions Precautions: Fall;Other (comment) Precaution Comments: external fixator R UE Required Braces or Orthoses: Splint/Cast Splint/Cast: external fixator R UE; splint L UE Splint/Cast - Date Prophylactic Dressing Applied (if applicable): 123456 Restrictions Weight Bearing Restrictions: Yes RUE Weight Bearing: Non weight bearing LUE Weight Bearing: Non weight bearing Other Position/Activity Restrictions: LUE: permitted to use L hand for basic ADLs and feed self; L UE digit motion as tolerated, no ROM restrictions to L elbow and shoulder. RUE: no lifting with R UE; permitted to move R digits and wrist as tolerated       Mobility Bed Mobility Overal bed mobility: Needs Assistance Bed Mobility: Sit to Supine       Sit to supine: Min assist   General bed mobility comments: incr time and assist for BLEs    Transfers Overall transfer level: Needs assistance Equipment used: None Transfers: Sit to/from Stand Sit to Stand: Min guard           General transfer comment: pt rocks 3x and is able to stand with forward flexion without physical assist from recliner and toilet     Balance Overall balance assessment: Needs assistance Sitting-balance support: Feet supported, No upper extremity supported Sitting balance-Leahy Scale: Good     Standing balance support: No upper extremity supported, During functional activity Standing balance-Leahy Scale: Fair Standing balance comment: toileting and grooming without LOB                           ADL either performed or assessed with clinical judgement   ADL Overall ADL's : Needs assistance/impaired     Grooming: Wash/dry hands;Adhering  to UE precautions;Cueing for safety;Standing;Min guard Grooming Details (indicate cue type and reason): at the sink                 Toilet Transfer: Min  guard;Ambulation;Regular Toilet   Toileting- Clothing Manipulation and Hygiene: Maximal assistance;Sit to/from stand Toileting - Clothing Manipulation Details (indicate cue type and reason): in standing     Functional mobility during ADLs: Min guard;Cueing for safety General ADL Comments: pt with great recall of education from prior sessions. close min G for all standing and OOB tasks. increased time needed for safety.    Extremity/Trunk Assessment Upper Extremity Assessment Upper Extremity Assessment: RUE deficits/detail;LUE deficits/detail RUE Deficits / Details: Ex fix immobilizing elbow. Able to perform digit opposition, digit flex/ext, and wrist ROM. RUE: Unable to fully assess due to pain;Unable to fully assess due to immobilization RUE Sensation: WNL RUE Coordination: decreased fine motor;decreased gross motor LUE Deficits / Details: full shoulder ROM, able to wiggle fingers. denies sensation changes. sx planned 9/14 LUE Sensation: WNL LUE Coordination: decreased fine motor   Lower Extremity Assessment Lower Extremity Assessment: Defer to PT evaluation        Vision   Vision Assessment?: No apparent visual deficits   Perception Perception Perception: Not tested   Praxis Praxis Praxis: Not tested    Cognition Arousal/Alertness: Awake/alert Behavior During Therapy: WFL for tasks assessed/performed Overall Cognitive Status: Impaired/Different from baseline Area of Impairment: Following commands, Safety/judgement, Awareness, Problem solving, Rancho level, Attention, Memory               Rancho Levels of Cognitive Functioning Rancho Los Amigos Scales of Cognitive Functioning: Purposeful, Appropriate   Current Attention Level: Selective Memory: Decreased short-term memory Following Commands: Follows one step commands consistently, Follows one step commands with increased time Safety/Judgement: Decreased awareness of deficits Awareness: Emergent Problem Solving:  Difficulty sequencing, Requires verbal cues, Slow processing General Comments: overall WFL with increased time. Able to accurately recall BUE precautions, and education from past therapy sessions. Demonstrated good safety awareness today Rancho [employer] Scales of Cognitive Functioning: Purposeful, Appropriate      Exercises      Shoulder Instructions       General Comments VSS on RA    Pertinent Vitals/ Pain       Pain Assessment Pain Assessment: Faces Faces Pain Scale: Hurts a little bit Pain Location: RUE Pain Descriptors / Indicators: Discomfort, Guarding, Operative site guarding Pain Intervention(s): Limited activity within patient's tolerance, Monitored during session  Home Living                                          Prior Functioning/Environment              Frequency  Min 2X/week        Progress Toward Goals  OT Goals(current goals can now be found in the care plan section)  Progress towards OT goals: Progressing toward goals  Acute Rehab OT Goals Patient Stated Goal: to ge better OT Goal Formulation: With patient Time For Goal Achievement: 09/09/22 Potential to Achieve Goals: Good ADL Goals Pt Will Perform Grooming: with set-up;sitting Pt Will Perform Upper Body Dressing: with min assist;sitting Pt Will Perform Lower Body Dressing: with min assist;sit to/from stand Pt Will Transfer to Toilet: with modified independence;ambulating Additional ADL Goal #1: pt will indep maintain BUE precautions throughout ADL tasks  Plan Discharge plan needs  to be updated    Co-evaluation                 AM-PAC OT "6 Clicks" Daily Activity     Outcome Measure   Help from another person eating meals?: A Little Help from another person taking care of personal grooming?: A Lot Help from another person toileting, which includes using toliet, bedpan, or urinal?: A Lot Help from another person bathing (including washing, rinsing, drying)?:  A Lot Help from another person to put on and taking off regular upper body clothing?: A Lot Help from another person to put on and taking off regular lower body clothing?: A Lot 6 Click Score: 13    End of Session Equipment Utilized During Treatment: Gait belt  OT Visit Diagnosis: Unsteadiness on feet (R26.81);Muscle weakness (generalized) (M62.81);History of falling (Z91.81);Pain   Activity Tolerance Patient tolerated treatment well   Patient Left in bed;with call bell/phone within reach;with bed alarm set;with family/visitor present   Nurse Communication Mobility status        Time: [id number] OT Time Calculation (min): 25 min  Charges: OT General Charges $OT Visit: 1 Visit OT Treatments $Self Care/Home Management : 23-37 mins    Elliot Cousin 09/01/2022, 1:48 PM

## 2022-09-02 LAB — CBC
HCT: 25.3 % — ABNORMAL LOW (ref 36.0–46.0)
Hemoglobin: 8.2 g/dL — ABNORMAL LOW (ref 12.0–15.0)
MCH: 32.5 pg (ref 26.0–34.0)
MCHC: 32.4 g/dL (ref 30.0–36.0)
MCV: 100.4 fL — ABNORMAL HIGH (ref 80.0–100.0)
Platelets: 162 10*3/uL (ref 150–400)
RBC: 2.52 MIL/uL — ABNORMAL LOW (ref 3.87–5.11)
RDW: 16.5 % — ABNORMAL HIGH (ref 11.5–15.5)
WBC: 4.2 10*3/uL (ref 4.0–10.5)
nRBC: 1 % — ABNORMAL HIGH (ref 0.0–0.2)

## 2022-09-02 MED ORDER — FERROUS SULFATE 325 (65 FE) MG PO TABS
325.0000 mg | ORAL_TABLET | Freq: Every day | ORAL | Status: DC
  Administered 2022-09-02 – 2022-09-03 (×2): 325 mg via ORAL
  Filled 2022-09-02 (×2): qty 1

## 2022-09-02 MED ORDER — ATORVASTATIN CALCIUM 10 MG PO TABS
20.0000 mg | ORAL_TABLET | Freq: Every day | ORAL | Status: DC
  Administered 2022-09-02 – 2022-09-03 (×2): 20 mg via ORAL
  Filled 2022-09-02 (×2): qty 2

## 2022-09-02 MED ORDER — PROPRANOLOL HCL 10 MG PO TABS
10.0000 mg | ORAL_TABLET | Freq: Two times a day (BID) | ORAL | Status: DC
  Administered 2022-09-02 – 2022-09-03 (×3): 10 mg via ORAL
  Filled 2022-09-02 (×3): qty 1

## 2022-09-02 MED ORDER — DULOXETINE HCL 60 MG PO CPEP
60.0000 mg | ORAL_CAPSULE | Freq: Every day | ORAL | Status: DC
  Administered 2022-09-02: 60 mg via ORAL

## 2022-09-02 MED ORDER — VITAMIN C 500 MG PO TABS
500.0000 mg | ORAL_TABLET | Freq: Every day | ORAL | Status: DC
  Administered 2022-09-02 – 2022-09-03 (×2): 500 mg via ORAL
  Filled 2022-09-02 (×2): qty 1

## 2022-09-02 MED ORDER — LOSARTAN POTASSIUM 50 MG PO TABS
100.0000 mg | ORAL_TABLET | Freq: Every day | ORAL | Status: DC
  Administered 2022-09-02 – 2022-09-03 (×2): 100 mg via ORAL
  Filled 2022-09-02 (×2): qty 2

## 2022-09-02 MED ORDER — LEVOTHYROXINE SODIUM 50 MCG PO TABS: 50.0000 ug | ORAL_TABLET | Freq: Every day | ORAL | Status: DC

## 2022-09-02 NOTE — Progress Notes (Signed)
Inpatient Rehab Admissions Coordinator:   I await insurance auth for potential CIR admit. I will follow for potential admit pending insurance auth.   Clemens Catholic, [place], [place] Admissions Coordinator  [phone] ([place]) [phone] (office)

## 2022-09-02 NOTE — Progress Notes (Signed)
Physical Therapy Treatment Patient Details Name: Vanessa [name] MRN: [id number] DOB: [birth year] Today's Date: 09/02/2022   History of Present Illness Pt is a 71 yo female who presented to [facility] on 08/25/22 after falling down 12 stairs at her home and sustaining SAH, R elbow fx with dislocation, L distal radius and ulna fx, and R proximal humerus fx. S/p right radial head arthroplasty, R elbow LCL repair, R coronoid repair, application of R UE external fixator for persistent instability, and ORIF of L radius and ulna fx 9/14. PMH: HTN, anxiety, depression, arthritis.    PT Comments    Pt is making good progress with mobility, ambulating x2 bouts of ~86 ft distances each bout today. However, she remains at high risk for falls, demonstrating x2 lateral LOB bouts towards her R, needing modA to recover. She is displaying good carryover of education provided from previous sessions, but does display deficits in her memory and safety awareness still. Will continue to follow acutely. Current recommendations remain appropriate.     Recommendations for follow up therapy are one component of a multi-disciplinary discharge planning process, led by the attending physician.  Recommendations may be updated based on patient status, additional functional criteria and insurance authorization.  Follow Up Recommendations  Acute inpatient rehab (3hours/day)     Assistance Recommended at Discharge Frequent or constant Supervision/Assistance  Patient can return home with the following Assistance with cooking/housework;Help with stairs or ramp for entrance;Assist for transportation;Direct supervision/assist for financial management;Direct supervision/assist for medications management;A lot of help with bathing/dressing/bathroom;A lot of help with walking and/or transfers   Equipment Recommendations  Other (comment) (defer to post acute)    Recommendations for Other Services       Precautions / Restrictions  Precautions Precautions: Fall;Other (comment) Precaution Comments: external fixator R UE Required Braces or Orthoses: Splint/Cast Splint/Cast: external fixator R UE; splint L UE Splint/Cast - Date Prophylactic Dressing Applied (if applicable): 08/25/22 Restrictions Weight Bearing Restrictions: Yes RUE Weight Bearing: Non weight bearing LUE Weight Bearing: Weight bear through elbow only Other Position/Activity Restrictions: LUE: permitted to use L hand for basic ADLs and feed self; L UE digit motion as tolerated, no ROM restrictions to L elbow and shoulder. RUE: no lifting with R UE; permitted to move R digits and wrist as tolerated     Mobility  Bed Mobility               General bed mobility comments: Sitting up in chair upon arrival.    Transfers Overall transfer level: Needs assistance Equipment used: 1 person hand held assist Transfers: Sit to/from Stand Sit to Stand: Min assist           General transfer comment: Pt recalling to scoot to edge of seat, minA under L elbow to power up to stand and steady transferring from chair 2x.    Ambulation/Gait Ambulation/Gait assistance: Min assist, Mod assist Gait Distance (Feet): 86 Feet (x2 bouts of ~86 ft each bout) Assistive device: 1 person hand held assist Gait Pattern/deviations: Step-through pattern, Decreased stride length, Wide base of support, Decreased dorsiflexion - right, Decreased dorsiflexion - left Gait velocity: decreased Gait velocity interpretation: <1.31 ft/sec, indicative of household ambulator   General Gait Details: Pt with slow, small steps, cuing for heel strike bil. Pt with good success with heel strike but displaying increased focus and inferior gaze to do so. Wide BOS noted. MinA with L elbow support for stability, modA to regain balance with x2 LOB bouts to her R.  Stairs             Wheelchair Mobility    Modified Rankin (Stroke Patients Only)       Balance Overall balance  assessment: Needs assistance Sitting-balance support: Feet supported, No upper extremity supported Sitting balance-Leahy Scale: Good     Standing balance support: Single extremity supported, No upper extremity supported, During functional activity Standing balance-Leahy Scale: Poor Standing balance comment: Reliant on support                            Cognition Arousal/Alertness: Awake/alert Behavior During Therapy: WFL for tasks assessed/performed Overall Cognitive Status: Impaired/Different from baseline Area of Impairment: Following commands, Safety/judgement, Awareness, Problem solving, Rancho level, Attention, Memory               Rancho Levels of Cognitive Functioning Rancho Los Amigos Scales of Cognitive Functioning: Purposeful, Appropriate   Current Attention Level: Alternating Memory: Decreased short-term memory Following Commands: Follows one step commands consistently, Follows one step commands with increased time Safety/Judgement: Decreased awareness of safety, Decreased awareness of deficits Awareness: Emergent Problem Solving: Difficulty sequencing, Requires verbal cues, Slow processing General Comments: overall WFL with increased time. Able to accurately recall BUE precautions, and education from past therapy sessions. Demonstrated good safety awareness today, but repeateding comments at times. Poor awareness into safety restrictions, like questioning whether she could walk outside on the roof with therapy.   Rancho [employer] Scales of Cognitive Functioning: Purposeful, Appropriate    Exercises      General Comments General comments (skin integrity, edema, etc.): HR up to 135 with gait, recovers with rest      Pertinent Vitals/Pain Pain Assessment Pain Assessment: Faces Faces Pain Scale: Hurts a little bit Pain Location: right shoulder/UE Pain Descriptors / Indicators: Discomfort, Guarding, Operative site guarding Pain Intervention(s):  Limited activity within patient's tolerance, Monitored during session, Repositioned    Home Living                          Prior Function            PT Goals (current goals can now be found in the care plan section) Acute Rehab PT Goals Patient Stated Goal: to get better PT Goal Formulation: With patient/family Time For Goal Achievement: 09/09/22 Potential to Achieve Goals: Good Progress towards PT goals: Progressing toward goals    Frequency    Min 5X/week      PT Plan Current plan remains appropriate    Co-evaluation              AM-PAC PT "6 Clicks" Mobility   Outcome Measure  Help needed turning from your back to your side while in a flat bed without using bedrails?: A Little Help needed moving from lying on your back to sitting on the side of a flat bed without using bedrails?: A Little Help needed moving to and from a bed to a chair (including a wheelchair)?: A Lot Help needed standing up from a chair using your arms (e.g., wheelchair or bedside chair)?: A Little Help needed to walk in hospital room?: A Lot Help needed climbing 3-5 steps with a railing? : A Lot 6 Click Score: 15    End of Session Equipment Utilized During Treatment: Gait belt Activity Tolerance: Patient tolerated treatment well Patient left: in chair;with call bell/phone within reach;with family/visitor present   PT Visit Diagnosis: Unsteadiness on feet (R26.81);Other  abnormalities of gait and mobility (R26.89);Muscle weakness (generalized) (M62.81);Difficulty in walking, not elsewhere classified (R26.2);Pain Pain - Right/Left: Right Pain - part of body: Arm     Time: 1829-9371 PT Time Calculation (min) (ACUTE ONLY): 18 min  Charges:  $Gait Training: 8-22 mins                     Raymond Gurney, PT, DPT Acute Rehabilitation Services  Office: [phone]    [name] 09/02/2022, 11:24 AM

## 2022-09-02 NOTE — Progress Notes (Signed)
[facility] Progress Note  6 Days Post-Op  Subjective: CC-  States that she had an emotional day yesterday, but she slept well last night and is feeling much better today. Pain is well controlled. Tolerating diet. BM yesterday.  Objective: Vital signs in last 24 hours: Temp:  [97.5 F (36.4 C)-98.5 F (36.9 C)] 97.6 F (36.4 C) (09/20 0719) Pulse Rate:  [89-107] 107 (09/20 0803) Resp:  [15-24] 16 (09/20 0719) BP: (110-153)/(64-98) 153/67 (09/20 0719) SpO2:  [92 %-100 %] 98 % (09/20 0803) Last BM Date : 09/01/22  Intake/Output from previous day: 09/19 0701 - 09/20 0700 In: 480 [P.O.:480] Out: 1300 [Urine:1300] Intake/Output this shift: No intake/output data recorded.  PE: Gen:  Alert, NAD, pleasant HEENT: EOM's intact, pupils equal and round Card:  mild tachy HR low 100s bpm, regular rhythm Pulm:  CTAB, no W/R/R, rate and effort normal on room air Abd: Soft, NT/ND Ext:  calves soft and nontender UE: ex fix RUE, L wrist splint, fingers warm and edema improving Skin: no rashes noted, warm and dry  Lab Results:  Recent Labs    09/01/22 0144 09/02/22 0644  WBC 3.8* 4.2  HGB 8.2* 8.2*  HCT 26.2* 25.3*  PLT 146* 162   BMET Recent Labs    08/31/22 0650  NA 140  K 3.6  CL 110  CO2 25  GLUCOSE 120*  BUN 12  CREATININE 0.62  CALCIUM 8.3*   PT/INR No results for input(s): "LABPROT", "INR" in the last 72 hours. CMP     Component Value Date/Time   NA 140 08/31/2022 0650   K 3.6 08/31/2022 0650   CL 110 08/31/2022 0650   CO2 25 08/31/2022 0650   GLUCOSE 120 (H) 08/31/2022 0650   BUN 12 08/31/2022 0650   CREATININE 0.62 08/31/2022 0650   CREATININE 0.67 01/13/2016 0833   CALCIUM 8.3 (L) 08/31/2022 0650   PROT 6.2 (L) 08/25/2022 0506   ALBUMIN 3.5 08/25/2022 0506   AST 26 08/25/2022 0506   ALT 23 08/25/2022 0506   ALKPHOS 67 08/25/2022 0506   BILITOT 0.8 08/25/2022 0506   GFRNONAA >60 08/31/2022 0650   GFRNONAA >89 08/09/2015 0833   GFRAA  >89 08/09/2015 0833   Lipase  No results found for: "LIPASE"     Studies/Results: No results found.  Anti-infectives: Anti-infectives (From admission, onward)    Start     Dose/Rate Route Frequency Ordered Stop   08/27/22 2300  ceFAZolin (ANCEF) IVPB 2g/100 mL premix        2 g 200 mL/hr over 30 Minutes Intravenous Every 8 hours 08/27/22 2210 08/28/22 2159   08/27/22 1300  ceFAZolin (ANCEF) IVPB 2g/100 mL premix        2 g 200 mL/hr over 30 Minutes Intravenous On call to O.R. 08/26/22 1111 08/27/22 1914        Assessment/Plan Fall down stairs   SDH, SAH - NSGY c/s, keppra for sz ppx x7 days, repeat CT negative. SLP eval completed R prox humerus fx - ortho evaluated and recommend total arthoplasty with Dr. Stann Mainland - scheduled 9/28 at [facility] elbow fx/dislocation - s/p OR with Dr. Marcelino Scot 9/14. Got CT of the elbow 9/17. Plan now is ex fix in place 4-6 weeks Left wrist fx - s/p ORIF 9/14 Dr. Marcelino Scot. NWB LUE ABLA - Hb stable at 8.2. add daily iron and vitamin C HTN - home meds HLD - home lipitor Hypothyroidism - synthroid GERD - PPI Anxiety/depression - cymbalta FEN -  SLIV, reg diet DVT - SCDs, LMWH Foley - d/c 9/15 and voiding   Dispo - TBI team therapies. Plan CIR followed by R total shoulder arthroplasty by Dr. Stann Mainland - currently scheduled 9/28 but could possibly reschedule to allow completion of CIR.   I reviewed last 24 h vitals and pain scores, last 48 h intake and output, last 24 h labs and trends, and last 24 h imaging results.    LOS: 8 days    [facility] 09/02/2022, 9:01 AM Please see Amion for pager number during day hours 7:00am-4:30pm

## 2022-09-03 ENCOUNTER — Other Ambulatory Visit: Payer: Self-pay

## 2022-09-03 ENCOUNTER — Encounter (HOSPITAL_COMMUNITY): Payer: Self-pay | Admitting: Physical Medicine & Rehabilitation

## 2022-09-03 ENCOUNTER — Inpatient Hospital Stay (HOSPITAL_COMMUNITY)
Admission: RE | Admit: 2022-09-03 | Discharge: 2022-09-10 | DRG: 945 | Disposition: A | Discharge status: Other Acute Inpatient Hospital | Payer: Medicare Other | Source: Intra-hospital | Attending: Physical Medicine & Rehabilitation | Admitting: Physical Medicine & Rehabilitation

## 2022-09-03 ENCOUNTER — Encounter (HOSPITAL_COMMUNITY): Payer: Self-pay

## 2022-09-03 DIAGNOSIS — R062 Wheezing: Secondary | ICD-10-CM

## 2022-09-03 DIAGNOSIS — Z79899 Other long term (current) drug therapy: Secondary | ICD-10-CM

## 2022-09-03 DIAGNOSIS — Z809 Family history of malignant neoplasm, unspecified: Secondary | ICD-10-CM

## 2022-09-03 DIAGNOSIS — W109XXD Fall (on) (from) unspecified stairs and steps, subsequent encounter: Secondary | ICD-10-CM | POA: Diagnosis present

## 2022-09-03 DIAGNOSIS — G4733 Obstructive sleep apnea (adult) (pediatric): Secondary | ICD-10-CM | POA: Diagnosis present

## 2022-09-03 DIAGNOSIS — S42201D Unspecified fracture of upper end of right humerus, subsequent encounter for fracture with routine healing: Secondary | ICD-10-CM | POA: Diagnosis not present

## 2022-09-03 DIAGNOSIS — S52502D Unspecified fracture of the lower end of left radius, subsequent encounter for closed fracture with routine healing: Secondary | ICD-10-CM

## 2022-09-03 DIAGNOSIS — F419 Anxiety disorder, unspecified: Secondary | ICD-10-CM | POA: Diagnosis present

## 2022-09-03 DIAGNOSIS — M1990 Unspecified osteoarthritis, unspecified site: Secondary | ICD-10-CM | POA: Diagnosis present

## 2022-09-03 DIAGNOSIS — S52602D Unspecified fracture of lower end of left ulna, subsequent encounter for closed fracture with routine healing: Secondary | ICD-10-CM | POA: Diagnosis not present

## 2022-09-03 DIAGNOSIS — G4700 Insomnia, unspecified: Secondary | ICD-10-CM | POA: Diagnosis present

## 2022-09-03 DIAGNOSIS — S069X1S Unspecified intracranial injury with loss of consciousness of 30 minutes or less, sequela: Secondary | ICD-10-CM

## 2022-09-03 DIAGNOSIS — E039 Hypothyroidism, unspecified: Secondary | ICD-10-CM | POA: Diagnosis present

## 2022-09-03 DIAGNOSIS — S42201S Unspecified fracture of upper end of right humerus, sequela: Secondary | ICD-10-CM | POA: Diagnosis not present

## 2022-09-03 DIAGNOSIS — Z79890 Hormone replacement therapy (postmenopausal): Secondary | ICD-10-CM | POA: Diagnosis not present

## 2022-09-03 DIAGNOSIS — F32A Depression, unspecified: Secondary | ICD-10-CM | POA: Diagnosis present

## 2022-09-03 DIAGNOSIS — D62 Acute posthemorrhagic anemia: Secondary | ICD-10-CM | POA: Diagnosis not present

## 2022-09-03 DIAGNOSIS — R000 Tachycardia, unspecified: Secondary | ICD-10-CM | POA: Diagnosis not present

## 2022-09-03 DIAGNOSIS — S069X3S Unspecified intracranial injury with loss of consciousness of 1 hour to 5 hours 59 minutes, sequela: Secondary | ICD-10-CM

## 2022-09-03 DIAGNOSIS — Z8616 Personal history of COVID-19: Secondary | ICD-10-CM | POA: Diagnosis not present

## 2022-09-03 DIAGNOSIS — S069XAD Unspecified intracranial injury with loss of consciousness status unknown, subsequent encounter: Principal | ICD-10-CM

## 2022-09-03 DIAGNOSIS — R7989 Other specified abnormal findings of blood chemistry: Secondary | ICD-10-CM | POA: Diagnosis not present

## 2022-09-03 DIAGNOSIS — R5381 Other malaise: Secondary | ICD-10-CM | POA: Diagnosis present

## 2022-09-03 DIAGNOSIS — T1490XA Injury, unspecified, initial encounter: Secondary | ICD-10-CM | POA: Diagnosis present

## 2022-09-03 DIAGNOSIS — S52502S Unspecified fracture of the lower end of left radius, sequela: Secondary | ICD-10-CM

## 2022-09-03 DIAGNOSIS — W109XXA Fall (on) (from) unspecified stairs and steps, initial encounter: Secondary | ICD-10-CM | POA: Diagnosis not present

## 2022-09-03 DIAGNOSIS — S069X3A Unspecified intracranial injury with loss of consciousness of 1 hour to 5 hours 59 minutes, initial encounter: Principal | ICD-10-CM

## 2022-09-03 DIAGNOSIS — S42401S Unspecified fracture of lower end of right humerus, sequela: Secondary | ICD-10-CM

## 2022-09-03 DIAGNOSIS — S42141A Displaced fracture of glenoid cavity of scapula, right shoulder, initial encounter for closed fracture: Secondary | ICD-10-CM | POA: Diagnosis not present

## 2022-09-03 DIAGNOSIS — G2581 Restless legs syndrome: Secondary | ICD-10-CM | POA: Diagnosis present

## 2022-09-03 DIAGNOSIS — M898X9 Other specified disorders of bone, unspecified site: Secondary | ICD-10-CM | POA: Diagnosis present

## 2022-09-03 DIAGNOSIS — E785 Hyperlipidemia, unspecified: Secondary | ICD-10-CM | POA: Diagnosis present

## 2022-09-03 DIAGNOSIS — S42351A Displaced comminuted fracture of shaft of humerus, right arm, initial encounter for closed fracture: Secondary | ICD-10-CM | POA: Insufficient documentation

## 2022-09-03 DIAGNOSIS — S52602S Unspecified fracture of lower end of left ulna, sequela: Secondary | ICD-10-CM | POA: Diagnosis not present

## 2022-09-03 DIAGNOSIS — S42241A 4-part fracture of surgical neck of right humerus, initial encounter for closed fracture: Secondary | ICD-10-CM | POA: Diagnosis present

## 2022-09-03 DIAGNOSIS — Z888 Allergy status to other drugs, medicaments and biological substances status: Secondary | ICD-10-CM

## 2022-09-03 DIAGNOSIS — S069X1A Unspecified intracranial injury with loss of consciousness of 30 minutes or less, initial encounter: Secondary | ICD-10-CM

## 2022-09-03 DIAGNOSIS — S069X3D Unspecified intracranial injury with loss of consciousness of 1 hour to 5 hours 59 minutes, subsequent encounter: Secondary | ICD-10-CM | POA: Diagnosis not present

## 2022-09-03 DIAGNOSIS — I10 Essential (primary) hypertension: Secondary | ICD-10-CM | POA: Diagnosis present

## 2022-09-03 DIAGNOSIS — F05 Delirium due to known physiological condition: Secondary | ICD-10-CM | POA: Diagnosis present

## 2022-09-03 DIAGNOSIS — D696 Thrombocytopenia, unspecified: Secondary | ICD-10-CM | POA: Diagnosis present

## 2022-09-03 DIAGNOSIS — S52202S Unspecified fracture of shaft of left ulna, sequela: Secondary | ICD-10-CM

## 2022-09-03 LAB — CBC
HCT: 27.8 % — ABNORMAL LOW (ref 36.0–46.0)
Hemoglobin: 8.6 g/dL — ABNORMAL LOW (ref 12.0–15.0)
MCH: 31.6 pg (ref 26.0–34.0)
MCHC: 30.9 g/dL (ref 30.0–36.0)
MCV: 102.2 fL — ABNORMAL HIGH (ref 80.0–100.0)
Platelets: 167 10*3/uL (ref 150–400)
RBC: 2.72 MIL/uL — ABNORMAL LOW (ref 3.87–5.11)
RDW: 16.7 % — ABNORMAL HIGH (ref 11.5–15.5)
WBC: 4.2 10*3/uL (ref 4.0–10.5)
nRBC: 1 % — ABNORMAL HIGH (ref 0.0–0.2)

## 2022-09-03 MED ORDER — SODIUM CHLORIDE 0.9% FLUSH
10.0000 mL | Freq: Two times a day (BID) | INTRAVENOUS | Status: DC
  Administered 2022-09-04 – 2022-09-09 (×9): 10 mL

## 2022-09-03 MED ORDER — GUAIFENESIN-DM 100-10 MG/5ML PO SYRP: 5.0000 mL | ORAL_SOLUTION | Freq: Four times a day (QID) | ORAL | Status: DC | PRN

## 2022-09-03 MED ORDER — FERROUS SULFATE 325 (65 FE) MG PO TABS
325.0000 mg | ORAL_TABLET | Freq: Every day | ORAL | Status: DC
  Administered 2022-09-04 – 2022-09-10 (×7): 325 mg via ORAL
  Filled 2022-09-03 (×7): qty 1

## 2022-09-03 MED ORDER — ATORVASTATIN CALCIUM 10 MG PO TABS
20.0000 mg | ORAL_TABLET | Freq: Every day | ORAL | Status: DC
  Administered 2022-09-04 – 2022-09-09 (×5): 20 mg via ORAL
  Filled 2022-09-03 (×6): qty 2

## 2022-09-03 MED ORDER — TROLAMINE SALICYLATE 10 % EX CREA: TOPICAL_CREAM | Freq: Two times a day (BID) | CUTANEOUS | Status: DC | PRN

## 2022-09-03 MED ORDER — NON FORMULARY: 10.0000 mg | Freq: Every day | Status: DC

## 2022-09-03 MED ORDER — PROCHLORPERAZINE 25 MG RE SUPP: 12.5000 mg | Freq: Four times a day (QID) | RECTAL | Status: DC | PRN

## 2022-09-03 MED ORDER — OXYCODONE HCL 5 MG PO TABS
10.0000 mg | ORAL_TABLET | ORAL | Status: DC | PRN
  Administered 2022-09-03 – 2022-09-09 (×15): 10 mg via ORAL
  Filled 2022-09-03 (×16): qty 2

## 2022-09-03 MED ORDER — GABAPENTIN 300 MG PO CAPS
300.0000 mg | ORAL_CAPSULE | Freq: Every day | ORAL | Status: DC
  Administered 2022-09-03 – 2022-09-09 (×7): 300 mg via ORAL
  Filled 2022-09-03 (×7): qty 1

## 2022-09-03 MED ORDER — PROPRANOLOL HCL 20 MG PO TABS
10.0000 mg | ORAL_TABLET | Freq: Two times a day (BID) | ORAL | Status: DC
  Administered 2022-09-03 – 2022-09-10 (×14): 10 mg via ORAL
  Filled 2022-09-03 (×14): qty 1

## 2022-09-03 MED ORDER — POLYETHYLENE GLYCOL 3350 17 G PO PACK
17.0000 g | PACK | Freq: Every day | ORAL | Status: DC
  Administered 2022-09-04 – 2022-09-09 (×4): 17 g via ORAL
  Filled 2022-09-03 (×7): qty 1

## 2022-09-03 MED ORDER — DOCUSATE SODIUM 100 MG PO CAPS
100.0000 mg | ORAL_CAPSULE | Freq: Two times a day (BID) | ORAL | Status: DC
  Administered 2022-09-03 – 2022-09-09 (×13): 100 mg via ORAL
  Filled 2022-09-03 (×14): qty 1

## 2022-09-03 MED ORDER — POLYVINYL ALCOHOL 1.4 % OP SOLN: 1.0000 [drp] | Freq: Every day | OPHTHALMIC | Status: DC | PRN

## 2022-09-03 MED ORDER — DIPHENHYDRAMINE HCL 12.5 MG/5ML PO ELIX: 12.5000 mg | ORAL_SOLUTION | Freq: Four times a day (QID) | ORAL | Status: DC | PRN

## 2022-09-03 MED ORDER — ALBUTEROL SULFATE (2.5 MG/3ML) 0.083% IN NEBU: 3.0000 mL | INHALATION_SOLUTION | RESPIRATORY_TRACT | Status: DC | PRN

## 2022-09-03 MED ORDER — DULOXETINE HCL 60 MG PO CPEP
60.0000 mg | ORAL_CAPSULE | Freq: Every day | ORAL | Status: DC
  Administered 2022-09-04 – 2022-09-10 (×7): 60 mg via ORAL
  Filled 2022-09-03 (×7): qty 1

## 2022-09-03 MED ORDER — BISACODYL 10 MG RE SUPP: 10.0000 mg | Freq: Every day | RECTAL | Status: DC | PRN

## 2022-09-03 MED ORDER — SODIUM CHLORIDE 0.9% FLUSH
10.0000 mL | INTRAVENOUS | Status: DC | PRN
  Administered 2022-09-07: 10 mL

## 2022-09-03 MED ORDER — ALUM & MAG HYDROXIDE-SIMETH 200-200-20 MG/5ML PO SUSP: 30.0000 mL | ORAL | Status: DC | PRN

## 2022-09-03 MED ORDER — ACETAMINOPHEN 325 MG PO TABS
650.0000 mg | ORAL_TABLET | Freq: Three times a day (TID) | ORAL | Status: DC
  Administered 2022-09-03 – 2022-09-10 (×23): 650 mg via ORAL
  Filled 2022-09-03 (×24): qty 2

## 2022-09-03 MED ORDER — VITAMIN D (ERGOCALCIFEROL) 1.25 MG (50000 UNIT) PO CAPS
50000.0000 [IU] | ORAL_CAPSULE | ORAL | Status: DC
  Administered 2022-09-03: 50000 [IU] via ORAL
  Filled 2022-09-03: qty 1

## 2022-09-03 MED ORDER — VITAMIN C 500 MG PO TABS
500.0000 mg | ORAL_TABLET | Freq: Every day | ORAL | Status: DC
  Administered 2022-09-04 – 2022-09-10 (×7): 500 mg via ORAL
  Filled 2022-09-03 (×7): qty 1

## 2022-09-03 MED ORDER — METHOCARBAMOL 500 MG PO TABS
1000.0000 mg | ORAL_TABLET | Freq: Three times a day (TID) | ORAL | Status: DC
  Administered 2022-09-03 – 2022-09-10 (×19): 1000 mg via ORAL
  Filled 2022-09-03 (×19): qty 2

## 2022-09-03 MED ORDER — VITAMIN D 25 MCG (1000 UNIT) PO TABS
1000.0000 [IU] | ORAL_TABLET | Freq: Every day | ORAL | Status: DC
  Administered 2022-09-04 – 2022-09-10 (×7): 1000 [IU] via ORAL
  Filled 2022-09-03 (×7): qty 1

## 2022-09-03 MED ORDER — PROCHLORPERAZINE EDISYLATE 10 MG/2ML IJ SOLN: 5.0000 mg | Freq: Four times a day (QID) | INTRAMUSCULAR | Status: DC | PRN

## 2022-09-03 MED ORDER — PROCHLORPERAZINE MALEATE 5 MG PO TABS: 5.0000 mg | ORAL_TABLET | Freq: Four times a day (QID) | ORAL | Status: DC | PRN

## 2022-09-03 MED ORDER — LOSARTAN POTASSIUM 50 MG PO TABS
100.0000 mg | ORAL_TABLET | Freq: Every day | ORAL | Status: DC
  Administered 2022-09-04 – 2022-09-10 (×7): 100 mg via ORAL
  Filled 2022-09-03 (×7): qty 2

## 2022-09-03 MED ORDER — POLYETHYLENE GLYCOL 3350 17 G PO PACK: 17.0000 g | PACK | Freq: Every day | ORAL | Status: DC | PRN

## 2022-09-03 MED ORDER — LEVOTHYROXINE SODIUM 50 MCG PO TABS
50.0000 ug | ORAL_TABLET | Freq: Every day | ORAL | Status: DC
  Administered 2022-09-04 – 2022-09-10 (×7): 50 ug via ORAL
  Filled 2022-09-03 (×7): qty 1

## 2022-09-03 MED ORDER — FLEET ENEMA 7-19 GM/118ML RE ENEM: 1.0000 | ENEMA | Freq: Once | RECTAL | Status: DC | PRN

## 2022-09-03 MED ORDER — PANTOPRAZOLE SODIUM 40 MG PO TBEC
40.0000 mg | DELAYED_RELEASE_TABLET | Freq: Every day | ORAL | Status: DC
  Administered 2022-09-04 – 2022-09-10 (×7): 40 mg via ORAL
  Filled 2022-09-03 (×7): qty 1

## 2022-09-03 MED ORDER — ACETAMINOPHEN 325 MG PO TABS: 325.0000 mg | ORAL_TABLET | ORAL | Status: DC | PRN

## 2022-09-03 MED ORDER — ENOXAPARIN SODIUM 30 MG/0.3ML IJ SOSY
30.0000 mg | PREFILLED_SYRINGE | Freq: Two times a day (BID) | INTRAMUSCULAR | Status: DC
  Administered 2022-09-03 – 2022-09-09 (×13): 30 mg via SUBCUTANEOUS
  Filled 2022-09-03 (×14): qty 0.3

## 2022-09-03 MED ORDER — MELATONIN 5 MG PO TABS
5.0000 mg | ORAL_TABLET | Freq: Every evening | ORAL | Status: DC | PRN
  Administered 2022-09-03 – 2022-09-07 (×2): 5 mg via ORAL
  Filled 2022-09-03 (×2): qty 1

## 2022-09-03 NOTE — Progress Notes (Addendum)
Inpatient Rehabilitation Admission Medication Review by a Pharmacist   A complete drug regimen review was completed for this patient to identify any potential clinically significant medication issues.   High Risk Drug Classes Is patient taking? Indication by Medication  Antipsychotic Yes Robitussin DM - cough Compazine (PO/IM) - nausea/vomiting  Anticoagulant Yes Lovenox - VTE ppx  Antibiotic No    Opioid Yes Oxycodone PO - pain  Antiplatelet No    Hypoglycemics/insulin No    Vasoactive Medication Yes Losartan, propranolol - HTN  Chemotherapy No    Other Yes Benadryl - itching Atorvastatin - HLD Cymbalta - anxiety/depression Synthroid - hypothyroidism Melatonin - sleep Protonix - GERD Gabapentin, Robaxin, Aspercreme - pain Iron - anemia Albuterol - shortness of breath, wheezing Vitamin D - supplement Zelaphon - sleep        Type of Medication Issue Identified Description of Issue Recommendation(s)  Drug Interaction(s) (clinically significant)        Duplicate Therapy        Allergy        No Medication Administration End Date        Incorrect Dose        Additional Drug Therapy Needed        Significant med changes from prior encounter (inform family/care partners about these prior to discharge).      Other   PTA meds: zaleplon, pepcid Restart PTA meds when and if necessary during CIR admission or at time of discharge, if warranted      Clinically significant medication issues were identified that warrant physician communication and completion of prescribed/recommended actions by midnight of the next day:  No   Name of provider notified for urgent issues identified:    Provider Method of Notification:        Pharmacist comments:    Time spent performing this drug regimen review (minutes):  [place], PharmD, BCPS 09/03/2022 2:54 PM

## 2022-09-03 NOTE — Progress Notes (Deleted)
Inpatient Rehabilitation Admission Medication Review by a Pharmacist  A complete drug regimen review was completed for this patient to identify any potential clinically significant medication issues.  High Risk Drug Classes Is patient taking? Indication by Medication  Antipsychotic Yes Robitussin DM - cough Compazine (PO/IM) - nausea/vomiting  Anticoagulant Yes Lovenox - VTE ppx  Antibiotic No   Opioid Yes Oxycodone PO - pain  Antiplatelet No   Hypoglycemics/insulin No   Vasoactive Medication Yes Losartan, propranolol - HTN  Chemotherapy No   Other Yes Benadryl - itching Atorvastatin - HLD Cymbalta - anxiety/depression Synthroid - hypothyroidism Melatonin - sleep Protonix - GERD Gabapentin, Robaxin, Aspercreme - pain Iron - anemia Albuterol - shortness of breath, wheezing Vitamin D - supplement     Type of Medication Issue Identified Description of Issue Recommendation(s)  Drug Interaction(s) (clinically significant)     Duplicate Therapy     Allergy     No Medication Administration End Date     Incorrect Dose     Additional Drug Therapy Needed     Significant med changes from prior encounter (inform family/care partners about these prior to discharge).    Other  PTA meds: zaleplon, pepcid Restart PTA meds when and if necessary during CIR admission or at time of discharge, if warranted    Clinically significant medication issues were identified that warrant physician communication and completion of prescribed/recommended actions by midnight of the next day:  No  Name of provider notified for urgent issues identified:   Provider Method of Notification:     Pharmacist comments:   Time spent performing this drug regimen review (minutes):  [place], PharmD, BCPS 09/03/2022 2:54 PM

## 2022-09-03 NOTE — TOC Transition Note (Signed)
Transition of Care [facility]) - CM/SW Discharge Note   Patient Details  Name: Vanessa [name] MRN: [id number] Date of Birth: July 26, 1951  Transition of Care [facility]) CM/SW Contact:  Ella Bodo, RN Phone Number: 09/03/2022, 11:55 AM   Clinical Narrative:    Patient medically stable for discharge and insurance authorization has been received for admission to [facility] today.  Plan discharge to inpatient rehab when bed ready.     Final next level of care: IP Rehab Facility Barriers to Discharge: Barriers Resolved   Patient Goals and CMS Choice Patient states their goals for this hospitalization and ongoing recovery are:: to go home CMS Medicare.gov Compare Post Acute Care list provided to:: Patient Choice offered to / list presented to : Patient                      Discharge Plan and Services   Discharge Planning Services: CM Consult Post Acute Care Choice: IP Rehab                               Social Determinants of Health (SDOH) Interventions     Readmission Risk Interventions     No data to display         Reinaldo Raddle, RN, BSN  Trauma/Neuro ICU Case Manager [phone]

## 2022-09-03 NOTE — Progress Notes (Signed)
[facility] Progress Note  7 Days Post-Op  Subjective: CC-  Up in chair already this morning. About to get some pain medication, overall pain well controlled. Tolerating diet. BM yesterday. Did not sleep quite as well last night. States that she sometimes gets confused at night due to brain bleed and that keeps her from sleeping as well.  Objective: Vital signs in last 24 hours: Temp:  [97.6 F (36.4 C)-98.6 F (37 C)] 97.9 F (36.6 C) (09/21 0757) Pulse Rate:  [82-98] 87 (09/21 0757) Resp:  [14-15] 15 (09/21 0757) BP: (129-147)/(59-82) 146/71 (09/21 0757) SpO2:  [98 %-99 %] 98 % (09/21 0757) Last BM Date : 09/02/22  Intake/Output from previous day: 09/20 0701 - 09/21 0700 In: -  Out: 2000 [Urine:2000] Intake/Output this shift: No intake/output data recorded.  PE: Gen:  Alert, NAD, pleasant HEENT: EOM's intact, pupils equal and round Card:  RRR Pulm:  CTAB, no W/R/R, rate and effort normal on room air Abd: Soft, NT/ND UE: ex fix RUE, L wrist splint, fingers warm with minimal edema Skin: no rashes noted, warm and dry  Lab Results:  Recent Labs    09/02/22 0644 09/03/22 0550  WBC 4.2 4.2  HGB 8.2* 8.6*  HCT 25.3* 27.8*  PLT 162 167   BMET No results for input(s): "NA", "K", "CL", "CO2", "GLUCOSE", "BUN", "CREATININE", "CALCIUM" in the last 72 hours. PT/INR No results for input(s): "LABPROT", "INR" in the last 72 hours. CMP     Component Value Date/Time   NA 140 08/31/2022 0650   K 3.6 08/31/2022 0650   CL 110 08/31/2022 0650   CO2 25 08/31/2022 0650   GLUCOSE 120 (H) 08/31/2022 0650   BUN 12 08/31/2022 0650   CREATININE 0.62 08/31/2022 0650   CREATININE 0.67 01/13/2016 0833   CALCIUM 8.3 (L) 08/31/2022 0650   PROT 6.2 (L) 08/25/2022 0506   ALBUMIN 3.5 08/25/2022 0506   AST 26 08/25/2022 0506   ALT 23 08/25/2022 0506   ALKPHOS 67 08/25/2022 0506   BILITOT 0.8 08/25/2022 0506   GFRNONAA >60 08/31/2022 0650   GFRNONAA >89 08/09/2015 0833    GFRAA >89 08/09/2015 0833   Lipase  No results found for: "LIPASE"     Studies/Results: No results found.  Anti-infectives: Anti-infectives (From admission, onward)    Start     Dose/Rate Route Frequency Ordered Stop   08/27/22 2300  ceFAZolin (ANCEF) IVPB 2g/100 mL premix        2 g 200 mL/hr over 30 Minutes Intravenous Every 8 hours 08/27/22 2210 08/28/22 2159   08/27/22 1300  ceFAZolin (ANCEF) IVPB 2g/100 mL premix        2 g 200 mL/hr over 30 Minutes Intravenous On call to O.R. 08/26/22 1111 08/27/22 1914        Assessment/Plan Fall down stairs   SDH, SAH - NSGY c/s, keppra for sz ppx x7 days, repeat CT negative. SLP eval completed R prox humerus fx - ortho evaluated and recommend total arthoplasty with Dr. Stann Mainland - scheduled 9/28 at [facility] elbow fx/dislocation - s/p OR with Dr. Marcelino Scot 9/14. Got CT of the elbow 9/17. Plan now is ex fix in place 4-6 weeks Left wrist fx - s/p ORIF 9/14 Dr. Marcelino Scot. NWB LUE ABLA - Hb 8.6, stable. Continue daily iron and vitamin C HTN - home meds HLD - home lipitor Hypothyroidism - synthroid GERD - PPI Anxiety/depression - cymbalta FEN - SLIV, reg diet DVT - SCDs, LMWH Foley - d/c  9/15 and voiding   Dispo - TBI team therapies. Plan CIR followed by R total shoulder arthroplasty by Dr. Stann Mainland - currently scheduled 9/28 but could possibly reschedule to allow completion of CIR. Medically stable for CIR once approved and bed available.    I reviewed therapy notes, last 24 h vitals and pain scores, last 48 h intake and output, last 24 h labs and trends, and last 24 h imaging results.    LOS: 9 days    [facility] 09/03/2022, 8:31 AM Please see Amion for pager number during day hours 7:00am-4:30pm

## 2022-09-03 NOTE — H&P (Signed)
Physical Medicine and Rehabilitation Admission H&P    Chief Complaint  Patient presents with   Functional deficits due to fall with trauma.    HPI:  Vanessa [name] is a 71 year old female with history of HTN, Tremors, myalgia, RLS w/insomnia, anxiety/depression who was admitted on 08/25/22 after being found at the bottom of 12 stairs with reports of headache and right arm pain. She does not recall events leading to fall but thinks that she taken double dose of sleeping pills, may have gotten disoriented, turned wrong way and fell down the stairs. She was noted to have right orbital hematoma with facial abrasions, gross deformities right elbow and left wrist, had mental status changes and was perseverative at admission. She was found to have trace left and parafalcine SDH with trace SAH, supraorbital scalp hematoma, displaced angulated right humerus head neck fx w Fx of glenoid rim/coracoid process, posterior dislocation of radius with comminution of proximal right radius and supracondylar Fx of humerus, comminuted displaced fractures of distal left radius and ulna with radiocarpal joint involvement as well as incidental findings of advanced C6/C7 degeneration with moderate to severe foraminal stenosis and moderate facet arthropathy L4 and L5 with mild anterolisthesis L4 on L5. She was evaluated by ortho with reduction of right elbow and left distal/radius Fx with splinting and NWB. She was taken to OR on 08/27/22 for ORIF left distal radius and ulna Fxs and right radial head arthroplasty with right elbow ligament reconstruction by Dr. Carola Frost on 09/14. Post op NWB RUE and Left wrist--Ok to use left hand for self feed and ADLs but no axial loading. Plans for right total shoulder  arthroplasty by Dr. Aundria Rud next week.   Follow up CT RUE showed external hardware without evidence of hardware loosening with fat stranding, edema and post op changes. No further surgeries and external fixator to stay in  place for 4-6 weeks. Dr. Wynetta Emery recommended conservative care with repeat CTH prn neurological changes.  Post op anemia being monitored and improving.    Review of Systems  Constitutional:  Negative for chills and fever.  HENT:  Negative for hearing loss.   Respiratory:  Negative for cough and shortness of breath.   Cardiovascular:  Positive for palpitations. Negative for chest pain.  Gastrointestinal:  Negative for constipation.  Genitourinary:  Negative for dysuria and urgency.  Neurological:  Positive for dizziness (getting better) and tremors (hands and head). Negative for headaches.  Psychiatric/Behavioral:  The patient has insomnia.     Past Medical History:  Diagnosis Date   Anxiety    Arthritis    Bladder prolapse, female, acquired    COVID-19 03/2022   Depression    Hypertension    Myalgia    Occasional tremors    OSA (obstructive sleep apnea)    mild   Restless leg syndrome    Thrombocytopenia (HCC) in the past     Past Surgical History:  Procedure Laterality Date   FRACTURE SURGERY  2011   left ankle Fx s/p fixation   KNEE CARTILAGE SURGERY Right     Family History  Problem Relation Age of Onset   Cancer Mother     Social History:  Married. Independent without AD but sleeps in a lift recliner chair at nights. She  reports that she has never smoked. She has never used smokeless tobacco. She drinks a margarita and/or glass of wine occasionally. She does not use drugs.   Allergies  Allergen Reactions   Ace  Inhibitors Cough    Medications Prior to Admission  Medication Sig Dispense Refill   atorvastatin (LIPITOR) 20 MG tablet Take 20 mg by mouth daily.     DULoxetine (CYMBALTA) 60 MG capsule Take 60 mg by mouth daily.     famotidine (PEPCID) 40 MG tablet Take 40 mg by mouth daily.     gabapentin (NEURONTIN) 300 MG capsule Take 600 mg by mouth at bedtime.     levothyroxine (SYNTHROID) 50 MCG tablet Take 50 mcg by mouth daily.     losartan (COZAAR) 100 MG  tablet Take 100 mg by mouth daily.     OVER THE COUNTER MEDICATION Apply 1 Application topically as needed (For back pain).     Polyethyl Glycol-Propyl Glycol (SYSTANE OP) Place 1-2 drops into both eyes daily as needed (Dry eyes).     propranolol (INDERAL) 10 MG tablet Take 10 mg by mouth 2 (two) times daily.     zaleplon (SONATA) 10 MG capsule Take 10 mg by mouth at bedtime.       Home: Home Living Family/patient expects to be discharged to:: Private residence Living Arrangements: Spouse/significant other Available Help at Discharge: Family, Available 24 hours/day Type of Home: House Home Access: Stairs to enter [employer] of Steps: 3 Entrance Stairs-Rails: None Home Layout: Two level, Bed/bath upstairs Alternate Level Stairs-Number of Steps: flight Bathroom Shower/Tub: Health visitor: Standard Bathroom Accessibility: Yes Home Equipment: Agricultural consultant (2 wheels), [employer] - single point, Environmental education officer Comments: lives with husband for 24/7 support. no bedroom downstairs, 3x Research scientist (medical) (she can sleep in) and 1/2 bath downstairs  Lives With: Spouse   Functional History: Prior Function Prior Level of Function : Independent/Modified Independent, Driving Mobility Comments: no AD ADLs Comments: indep  Functional Status:  Mobility: Bed Mobility Overal bed mobility: Needs Assistance Bed Mobility: Sit to Supine Supine to sit: Min assist, HOB elevated Sit to supine: Min assist General bed mobility comments: Sitting up in chair upon arrival. Transfers Overall transfer level: Needs assistance Equipment used: 1 person hand held assist Transfers: Sit to/from Stand Sit to Stand: Min assist, Mod assist Bed to/from chair/wheelchair/BSC transfer type:: Step pivot Step pivot transfers: Mod assist General transfer comment: Pt recalling to scoot to edge of seat, minA under L elbow to power up to stand and steady transferring from chair the first  3x, but then modA as pt fatigued the final x3 bouts. Ambulation/Gait Ambulation/Gait assistance: Min assist Gait Distance (Feet): 204 Feet Assistive device: 1 person hand held assist Gait Pattern/deviations: Step-through pattern, Decreased stride length, Wide base of support, Decreased dorsiflexion - right, Decreased step length - right General Gait Details: Pt with slow, small steps, with good heel strike carryover from previous session. Pt cued to look superiorly and not at feet. Improved speed, but still slow. Pt slows speed or displays slightly increased lateral trunk sway when turning head L <> R or looking up <> down. SLight instability when changing speeds. Decreased R step length and foot clearance along with increased instability and slower speed when cued to dual task by counting backwards by 3s from 30 while ambulating. x2 LOB, minA for stability and recovery. Gait velocity: decreased Gait velocity interpretation: <1.31 ft/sec, indicative of household ambulator Pre-gait activities: Marching in place in front of recliner 2x 4-6 reps with modA, LOB due to R knee buckle and trunk sway, needing assistance to recover and cue pt to sit as needed for safety. Cues provided to slow down the weight  shift to focus on extending R knee.    ADL: ADL Overall ADL's : Needs assistance/impaired Eating/Feeding: Moderate assistance, Sitting Grooming: Wash/dry hands, Adhering to UE precautions, Cueing for safety, Standing, Min guard Grooming Details (indicate cue type and reason): at the sink Upper Body Bathing: Maximal assistance, Sitting Lower Body Bathing: Maximal assistance, Sit to/from stand Upper Body Dressing : Maximal assistance, Sitting Lower Body Dressing: Maximal assistance, Sit to/from stand Toilet Transfer: Min guard, Ambulation, Regular Toilet Toileting- Clothing Manipulation and Hygiene: Maximal assistance, Sit to/from stand Toileting - Clothing Manipulation Details (indicate cue type  and reason): in standing Functional mobility during ADLs: Min guard, Cueing for safety General ADL Comments: pt with great recall of education from prior sessions. close min G for all standing and OOB tasks. increased time needed for safety.  Cognition: Cognition Overall Cognitive Status: Impaired/Different from baseline Arousal/Alertness: Awake/alert Orientation Level: Oriented X4 Year: 2023 Month: September Day of Week: Correct Attention: Focused, Sustained Focused Attention: Appears intact Sustained Attention: Impaired Sustained Attention Impairment: Verbal complex Memory: Impaired Memory Impairment:  (Immediate: 5/5 with repetition x5; delayed:) Awareness: Impaired Awareness Impairment: Emergent impairment Problem Solving: Impaired Problem Solving Impairment: Verbal complex (money: 3/3 with repetition; time) Executive Function: Landscape architectrganizing Organizing: Impaired Organizing Impairment: Verbal complex (backward digit span: 1/2) [employer] Mir[employer] Amigos Scales of Cognitive Functioning: Purposeful, Appropriate Cognition Arousal/Alertness: Awake/alert Behavior During Therapy: WFL for tasks assessed/performed Overall Cognitive Status: Impaired/Different from baseline Area of Impairment: Following commands, Safety/judgement, Awareness, Problem solving, Rancho level, Attention, Memory Orientation Level: Disoriented to, Time (month was "July" because they had been "celebrating Christmas in July") Current Attention Level: Alternating Memory: Decreased short-term memory Following Commands: Follows multi-step commands consistently, Follows multi-step commands with increased time Safety/Judgement: Decreased awareness of safety, Decreased awareness of deficits Awareness: Emergent Problem Solving: Difficulty sequencing, Requires verbal cues, Slow processing General Comments: Pt with difficulty dual-tasking, making x3 mistakes counting backwards by 3s from 30 and displaying increased instability and  R foot drag when ambulating with cognitive challenge. A&Ox4 though and more aware of her deficits and safety today with less repetition of comments noted.   Blood pressure 135/74, pulse 76, temperature 97.6 F (36.4 C), temperature source Oral, resp. rate 15, height 5\' 6"  (1.676 m), weight 90.7 kg, SpO2 99 %. Physical Exam Vitals and nursing note reviewed.  Constitutional:      Appearance: Normal appearance.  HENT:     Head:     Comments: Right periorbital ecchymosis resolving. Old wound above right eye    Nose: Nose normal.     Mouth/Throat:     Mouth: Mucous membranes are moist.  Eyes:     Conjunctiva/sclera: Conjunctivae normal.  Cardiovascular:     Rate and Rhythm: Normal rate and regular rhythm.     Heart sounds: No murmur heard.    No gallop.  Pulmonary:     Effort: Pulmonary effort is normal. No respiratory distress.     Breath sounds: No wheezing.  Abdominal:     General: There is no distension.     Palpations: Abdomen is soft.     Tenderness: There is no abdominal tenderness.  Musculoskeletal:     Cervical back: Normal range of motion.     Comments: RUE with external fixator in place on elbow covered with ace wrap from mid biceps to mid palm. Ecchymotic areas with edema noted. Ecchymoses also around right shoulder as well Left forearm splinted/wrapped  Neurological:     Mental Status: She is alert and oriented to person, place,  and time.     Comments: Alert and oriented x 3. Normal insight and awareness. Intact Memory. Normal language and speech. Cranial nerve exam unremarkable Doesn't recall accident. First thing she recalls is waking up in hospital. Surprisingly sharp and appropriate. UE limited by ortho and pain. Moves both LE's spontaneously at least 3-4/5. Sensory exam normal for light touch and pain in all 4 limbs. No limb ataxia or cerebellar signs. No abnormal tone appreciated.    Psychiatric:        Mood and Affect: Mood normal.        Behavior: Behavior normal.         Thought Content: Thought content normal.     Results for orders placed or performed during the hospital encounter of 08/25/22 (from the past 48 hour(s))  CBC     Status: Abnormal   Collection Time: 09/02/22  6:44 AM  Result Value Ref Range   WBC 4.2 4.0 - 10.5 K/uL   RBC 2.52 (L) 3.87 - 5.11 MIL/uL   Hemoglobin 8.2 (L) 12.0 - 15.0 g/dL   HCT 25.3 (L) 36.0 - 46.0 %   MCV 100.4 (H) 80.0 - 100.0 fL   MCH 32.5 26.0 - 34.0 pg   MCHC 32.4 30.0 - 36.0 g/dL   RDW 16.5 (H) 11.5 - 15.5 %   Platelets 162 150 - 400 K/uL   nRBC 1.0 (H) 0.0 - 0.2 %    Comment: Performed at [facility], [address] [place], [place] [address]  CBC     Status: Abnormal   Collection Time: 09/03/22  5:50 AM  Result Value Ref Range   WBC 4.2 4.0 - 10.5 K/uL   RBC 2.72 (L) 3.87 - 5.11 MIL/uL   Hemoglobin 8.6 (L) 12.0 - 15.0 g/dL   HCT 27.8 (L) 36.0 - 46.0 %   MCV 102.2 (H) 80.0 - 100.0 fL   MCH 31.6 26.0 - 34.0 pg   MCHC 30.9 30.0 - 36.0 g/dL   RDW 16.7 (H) 11.5 - 15.5 %   Platelets 167 150 - 400 K/uL   nRBC 1.0 (H) 0.0 - 0.2 %    Comment: Performed at [facility] [address], [place], [place] [address]   No results found.    Blood pressure 135/74, pulse 76, temperature 97.6 F (36.4 C), temperature source Oral, resp. rate 15, height 5\' 6"  (1.676 m), weight 90.7 kg, SpO2 99 %.  Medical Problem List and Plan: 1. Functional deficits secondary to polytrauma including right elbow and proximal humerus fx, left distal radial/ulna fx  -patient may not shower  -ELOS/Goals: 10-12 days, sup to mod assist ADL's and supervision mobility 2.  Antithrombotics: -DVT/anticoagulation:  Pharmaceutical: Lovenox  -antiplatelet therapy: N/a 3. Pain Management: N/A 4. Mood/Behavior/Sleep: LCSW to follow for evaluation and support.   -antipsychotic agents: N/A 5. Neuropsych/cognition: This patient is capable of making decisions on her own behalf. 6. Skin/Wound Care: Monitor wound for healing.  Routine pressure relief measures.  7. Fluids/Electrolytes/Nutrition: Monitor I/O. Check CMET in am.   -- 8. Right elbow Fx/dislocation: Per ortho: Nonweightbearing right upper extremity             Okay to move digits and wrist as tolerated             No lifting with right upper extremity              ex fix x 4-6 weeks  9. Left distal radius/ulna Fx s/p ORIF  9/14: Nonweightbearing to left wrist. Maintain splint.             Okay to use left hand for basic ADLs and to feed self             No axial loading of the left wrist             Ice and elevate             Digit motion as tolerated             No motion restrictions to elbow and shoulder  10. Right proximal humerus Fx:  Nonweightbearing right upper extremity             Okay to move digits and wrist as tolerated             No lifting with right upper extremity  --Plans for total shoulder 09/28 at [facility] by Dr. Aundria Rud. Date may be adjusted depending upon rehab progress. Will discuss further with ortho as we progress here. 11. Sundowning/Insomnia: Will start Sleep-wake chart to monitor for disruption  -pt is aware of this. Attributes it to foreign place, waking up disoriented after she falls asleep 12. Tachycardia: Heart rate up to 140's with activity likely due to deconditioning  --monitor for symptoms with activity.  13. Acute blood loss anemia: Monitor H/H for stability. Trending up from 7.8-->8.6 today.  --Macrocytosis-->check anemia panel/folic acid.  --Check CBC in am.  14. Thrombocytopenia/Neutropenia: Has resolved--likely reactive.  H/o of thrombocytopenia in the past.  --recheck WBC/Plt tomorrow 15. H/o Tremors:  used inderal bid at home-->resumed a couple of days ago. 16. H/o anxiety/depression/LBP: Continue cymbalta.  17.  Metabolic bone disease:  borderline low @ 30.91. Will start pt on ergocalciferol --on vitamin D supplement.      Jacquelynn Cree, PA-C 09/03/2022

## 2022-09-03 NOTE — H&P (Addendum)
Physical Medicine and Rehabilitation Admission H&P        Chief Complaint  Patient presents with   Functional deficits due to fall with trauma.      HPI:  Vanessa [name] is a 71 year old female with history of HTN, Tremors, myalgia, RLS w/insomnia, anxiety/depression who was admitted on 08/25/22 after being found at the bottom of 12 stairs with reports of headache and right arm pain. She does not recall events leading to fall but thinks that she taken double dose of sleeping pills, may have gotten disoriented, turned wrong way and fell down the stairs. She was noted to have right orbital hematoma with facial abrasions, gross deformities right elbow and left wrist, had mental status changes and was perseverative at admission. She was found to have trace left and parafalcine SDH with trace SAH, supraorbital scalp hematoma, displaced angulated right humerus head neck fx w Fx of glenoid rim/coracoid process, posterior dislocation of radius with comminution of proximal right radius and supracondylar Fx of humerus, comminuted displaced fractures of distal left radius and ulna with radiocarpal joint involvement as well as incidental findings of advanced C6/C7 degeneration with moderate to severe foraminal stenosis and moderate facet arthropathy L4 and L5 with mild anterolisthesis L4 on L5. She was evaluated by ortho with reduction of right elbow and left distal/radius Fx with splinting and NWB. She was taken to OR on 08/27/22 for ORIF left distal radius and ulna Fxs and right radial head arthroplasty with right elbow ligament reconstruction by Dr. Marcelino Scot on 09/14. Post op NWB RUE and Left wrist--Ok to use left hand for self feed and ADLs but no axial loading. Plans for right total shoulder  arthroplasty by Dr. Stann Mainland next week.    Follow up CT RUE showed external hardware without evidence of hardware loosening with fat stranding, edema and post op changes. No further surgeries and external fixator to  stay in place for 4-6 weeks. Dr. Saintclair Halsted recommended conservative care with repeat CTH prn neurological changes.  Post op anemia being monitored and improving.      Review of Systems  Constitutional:  Negative for chills and fever.  HENT:  Negative for hearing loss.   Respiratory:  Negative for cough and shortness of breath.   Cardiovascular:  Positive for palpitations. Negative for chest pain.  Gastrointestinal:  Negative for constipation.  Genitourinary:  Negative for dysuria and urgency.  Neurological:  Positive for dizziness (getting better) and tremors (hands and head). Negative for headaches.  Psychiatric/Behavioral:  The patient has insomnia.           Past Medical History:  Diagnosis Date   Anxiety     Arthritis     Bladder prolapse, female, acquired     COVID-19 03/2022   Depression     Hypertension     Myalgia     Occasional tremors     OSA (obstructive sleep apnea)      mild   Restless leg syndrome     Thrombocytopenia ([place]) in the past             Past Surgical History:  Procedure Laterality Date   FRACTURE SURGERY   2011    left ankle Fx s/p fixation   KNEE CARTILAGE SURGERY Right             Family History  Problem Relation Age of Onset   Cancer Mother        Social History:  Married. Independent  without AD but sleeps in a lift recliner chair at nights. She  reports that she has never smoked. She has never used smokeless tobacco. She drinks a margarita and/or glass of wine occasionally. She does not use drugs.         Allergies  Allergen Reactions   Ace Inhibitors Cough            Medications Prior to Admission  Medication Sig Dispense Refill   atorvastatin (LIPITOR) 20 MG tablet Take 20 mg by mouth daily.       DULoxetine (CYMBALTA) 60 MG capsule Take 60 mg by mouth daily.       famotidine (PEPCID) 40 MG tablet Take 40 mg by mouth daily.       gabapentin (NEURONTIN) 300 MG capsule Take 600 mg by mouth at bedtime.       levothyroxine (SYNTHROID)  50 MCG tablet Take 50 mcg by mouth daily.       losartan (COZAAR) 100 MG tablet Take 100 mg by mouth daily.       OVER THE COUNTER MEDICATION Apply 1 Application topically as needed (For back pain).       Polyethyl Glycol-Propyl Glycol (SYSTANE OP) Place 1-2 drops into both eyes daily as needed (Dry eyes).       propranolol (INDERAL) 10 MG tablet Take 10 mg by mouth 2 (two) times daily.       zaleplon (SONATA) 10 MG capsule Take 10 mg by mouth at bedtime.            Home: Home Living Family/patient expects to be discharged to:: Private residence Living Arrangements: Spouse/significant other Available Help at Discharge: Family, Available 24 hours/day Type of Home: House Home Access: Stairs to enter [employer] of Steps: 3 Entrance Stairs-Rails: None Home Layout: Two level, Bed/bath upstairs Alternate Level Stairs-Number of Steps: flight Bathroom Shower/Tub: Multimedia programmer: Standard Bathroom Accessibility: Yes Home Equipment: Conservation officer, nature (2 wheels), [employer] - single point, Electronics engineer Comments: lives with husband for 24/7 support. no bedroom downstairs, 3x Conservation officer, nature (she can sleep in) and 1/2 bath downstairs  Lives With: Spouse   Functional History: Prior Function Prior Level of Function : Independent/Modified Independent, Driving Mobility Comments: no AD ADLs Comments: indep   Functional Status:  Mobility: Bed Mobility Overal bed mobility: Needs Assistance Bed Mobility: Sit to Supine Supine to sit: Min assist, HOB elevated Sit to supine: Min assist General bed mobility comments: Sitting up in chair upon arrival. Transfers Overall transfer level: Needs assistance Equipment used: 1 person hand held assist Transfers: Sit to/from Stand Sit to Stand: Min assist, Mod assist Bed to/from chair/wheelchair/BSC transfer type:: Step pivot Step pivot transfers: Mod assist General transfer comment: Pt recalling to scoot to edge of  seat, minA under L elbow to power up to stand and steady transferring from chair the first 3x, but then modA as pt fatigued the final x3 bouts. Ambulation/Gait Ambulation/Gait assistance: Min assist Gait Distance (Feet): 204 Feet Assistive device: 1 person hand held assist Gait Pattern/deviations: Step-through pattern, Decreased stride length, Wide base of support, Decreased dorsiflexion - right, Decreased step length - right General Gait Details: Pt with slow, small steps, with good heel strike carryover from previous session. Pt cued to look superiorly and not at feet. Improved speed, but still slow. Pt slows speed or displays slightly increased lateral trunk sway when turning head L <> R or looking up <> down. SLight instability when changing speeds. Decreased R step length  and foot clearance along with increased instability and slower speed when cued to dual task by counting backwards by 3s from 30 while ambulating. x2 LOB, minA for stability and recovery. Gait velocity: decreased Gait velocity interpretation: <1.31 ft/sec, indicative of household ambulator Pre-gait activities: Marching in place in front of recliner 2x 4-6 reps with modA, LOB due to R knee buckle and trunk sway, needing assistance to recover and cue pt to sit as needed for safety. Cues provided to slow down the weight shift to focus on extending R knee.   ADL: ADL Overall ADL's : Needs assistance/impaired Eating/Feeding: Moderate assistance, Sitting Grooming: Wash/dry hands, Adhering to UE precautions, Cueing for safety, Standing, Min guard Grooming Details (indicate cue type and reason): at the sink Upper Body Bathing: Maximal assistance, Sitting Lower Body Bathing: Maximal assistance, Sit to/from stand Upper Body Dressing : Maximal assistance, Sitting Lower Body Dressing: Maximal assistance, Sit to/from stand Toilet Transfer: Min guard, Ambulation, Regular Toilet Toileting- Clothing Manipulation and Hygiene: Maximal  assistance, Sit to/from stand Toileting - Clothing Manipulation Details (indicate cue type and reason): in standing Functional mobility during ADLs: Min guard, Cueing for safety General ADL Comments: pt with great recall of education from prior sessions. close min G for all standing and OOB tasks. increased time needed for safety.   Cognition: Cognition Overall Cognitive Status: Impaired/Different from baseline Arousal/Alertness: Awake/alert Orientation Level: Oriented X4 Year: 2023 Month: September Day of Week: Correct Attention: Focused, Sustained Focused Attention: Appears intact Sustained Attention: Impaired Sustained Attention Impairment: Verbal complex Memory: Impaired Memory Impairment:  (Immediate: 5/5 with repetition x5; delayed:) Awareness: Impaired Awareness Impairment: Emergent impairment Problem Solving: Impaired Problem Solving Impairment: Verbal complex (money: 3/3 with repetition; time) Executive Function: Writer: Impaired Organizing Impairment: Verbal complex (backward digit span: 1/2) [place] [employer] Scales of Cognitive Functioning: Purposeful, Appropriate Cognition Arousal/Alertness: Awake/alert Behavior During Therapy: WFL for tasks assessed/performed Overall Cognitive Status: Impaired/Different from baseline Area of Impairment: Following commands, Safety/judgement, Awareness, Problem solving, [place] level, Attention, Memory Orientation Level: Disoriented to, Time (month was "July" because they had been "celebrating Christmas in July") Current Attention Level: Alternating Memory: Decreased short-term memory Following Commands: Follows multi-step commands consistently, Follows multi-step commands with increased time Safety/Judgement: Decreased awareness of safety, Decreased awareness of deficits Awareness: Emergent Problem Solving: Difficulty sequencing, Requires verbal cues, Slow processing General Comments: Pt with difficulty dual-tasking,  making x3 mistakes counting backwards by 3s from 30 and displaying increased instability and R foot drag when ambulating with cognitive challenge. A&Ox4 though and more aware of her deficits and safety today with less repetition of comments noted.     Blood pressure 135/74, pulse 76, temperature 97.6 F (36.4 C), temperature source Oral, resp. rate 15, height 5\' 6"  (1.676 m), weight 90.7 kg, SpO2 99 %. Physical Exam Vitals and nursing note reviewed.  Constitutional:      Appearance: Normal appearance.  HENT:     Head:     Comments: Right periorbital ecchymosis resolving. Old wound above right eye    Nose: Nose normal.     Mouth/Throat:     Mouth: Mucous membranes are moist.  Eyes:     Conjunctiva/sclera: Conjunctivae normal.  Cardiovascular:     Rate and Rhythm: Normal rate and regular rhythm.     Heart sounds: No murmur heard.    No gallop.  Pulmonary:     Effort: Pulmonary effort is normal. No respiratory distress.     Breath sounds: No wheezing.  Abdominal:     General:  There is no distension.     Palpations: Abdomen is soft.     Tenderness: There is no abdominal tenderness.  Musculoskeletal:     Cervical back: Normal range of motion.     Comments: RUE with external fixator in place on elbow covered with ace wrap from mid biceps to mid palm. Ecchymotic areas with edema noted. Ecchymoses also around right shoulder as well Left forearm splinted/wrapped  Neurological:     Mental Status: She is alert and oriented to person, place, and time.     Comments: Alert and oriented x 3. Normal insight and awareness. Intact Memory. Normal language and speech. Cranial nerve exam unremarkable Doesn't recall accident. First thing she recalls is waking up in hospital. Surprisingly sharp and appropriate. UE limited by ortho and pain. Moves both LE's spontaneously at least 3-4/5. Sensory exam normal for light touch and pain in all 4 limbs. No limb ataxia or cerebellar signs. No abnormal tone  appreciated.    Psychiatric:        Mood and Affect: Mood normal.        Behavior: Behavior normal.        Thought Content: Thought content normal.        Lab Results Last 48 Hours        Results for orders placed or performed during the hospital encounter of 08/25/22 (from the past 48 hour(s))  CBC     Status: Abnormal    Collection Time: 09/02/22  6:44 AM  Result Value Ref Range    WBC 4.2 4.0 - 10.5 K/uL    RBC 2.52 (L) 3.87 - 5.11 MIL/uL    Hemoglobin 8.2 (L) 12.0 - 15.0 g/dL    HCT 25.3 (L) 36.0 - 46.0 %    MCV 100.4 (H) 80.0 - 100.0 fL    MCH 32.5 26.0 - 34.0 pg    MCHC 32.4 30.0 - 36.0 g/dL    RDW 16.5 (H) 11.5 - 15.5 %    Platelets 162 150 - 400 K/uL    nRBC 1.0 (H) 0.0 - 0.2 %      Comment: Performed at [facility], [address], [place], [place] [address]  CBC     Status: Abnormal    Collection Time: 09/03/22  5:50 AM  Result Value Ref Range    WBC 4.2 4.0 - 10.5 K/uL    RBC 2.72 (L) 3.87 - 5.11 MIL/uL    Hemoglobin 8.6 (L) 12.0 - 15.0 g/dL    HCT 27.8 (L) 36.0 - 46.0 %    MCV 102.2 (H) 80.0 - 100.0 fL    MCH 31.6 26.0 - 34.0 pg    MCHC 30.9 30.0 - 36.0 g/dL    RDW 16.7 (H) 11.5 - 15.5 %    Platelets 167 150 - 400 K/uL    nRBC 1.0 (H) 0.0 - 0.2 %      Comment: Performed at [facility] [address] [place], [place] [address]      Imaging Results (Last 48 hours)  No results found.         Blood pressure 135/74, pulse 76, temperature 97.6 F (36.4 C), temperature source Oral, resp. rate 15, height 5\' 6"  (1.676 m), weight 90.7 kg, SpO2 99 %.   Medical Problem List and Plan: 1. 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             -  patient may not shower             -ELOS/Goals: 10-12 days, sup to mod assist ADL's and supervision mobility 2.  Antithrombotics: -DVT/anticoagulation:  Pharmaceutical: Lovenox              -antiplatelet therapy: N/a 3. Pain Management: N/A 4. Mood/Behavior/Sleep: LCSW to follow for evaluation and support.              -antipsychotic agents: N/A 5. Neuropsych/cognition: This patient is capable of making decisions on her own behalf. 6. Skin/Wound Care: Monitor wound for healing. Routine pressure relief measures.  7. Fluids/Electrolytes/Nutrition: Monitor I/O. Check CMET in am.              -- 8. Right elbow Fx/dislocation: Per ortho: Nonweightbearing right upper extremity             Okay to move digits and wrist as tolerated             No lifting with right upper extremity              ex fix x 4-6 weeks  9. Left distal radius/ulna Fx s/p ORIF 9/14: Nonweightbearing to left wrist. Maintain splint.             Okay to use left hand for basic ADLs and to feed self             No axial loading of the left wrist             Ice and elevate             Digit motion as tolerated             No motion restrictions to elbow and shoulder  10. Right proximal humerus Fx:  Nonweightbearing right upper extremity             Okay to move digits and wrist as tolerated             No lifting with right upper extremity  --Plans for total shoulder 09/28 at [facility] by Dr. Stann Mainland. Date may be adjusted depending upon rehab progress. Will discuss further with ortho as we progress here. 11. Sundowning/Insomnia: Will start Sleep-wake chart to monitor for disruption             -pt is aware of this. Attributes it to foreign place, waking up disoriented after she falls asleep 12. Tachycardia: Heart rate up to 140's with activity likely due to deconditioning             --monitor for symptoms with activity.  13. Acute blood loss anemia: Monitor H/H for stability. Trending up from 7.8-->8.6 today.  --Macrocytosis-->check anemia panel/folic acid.  --Check CBC in am.  14. Thrombocytopenia/Neutropenia: Has resolved--likely reactive.  H/o of thrombocytopenia in the past.             --recheck WBC/Plt  tomorrow 15. H/o Tremors:  used inderal bid at home-->resumed a couple of days ago. 16. H/o anxiety/depression/LBP: Continue cymbalta.  17.  Metabolic bone disease:  borderline low @ 30.91. Will start pt on ergocalciferol --on vitamin D supplement.          Bary Leriche, PA-C 09/03/2022   I have personally performed a face to face diagnostic evaluation of this patient and formulated the key components of the plan.  Additionally, I have personally reviewed laboratory data, imaging studies, as well as relevant notes and concur with the physician assistant's documentation above.  The patient's status has not changed from the original H&P.  Any changes in documentation from the acute care chart have been noted above.  Meredith Staggers, MD, [facility]

## 2022-09-03 NOTE — Progress Notes (Signed)
PMR Admission Coordinator Pre-Admission Assessment   Patient: Vanessa [name] is an 71 y.o., female MRN: [id number] DOB: May 23, 1951 Height: _0  (167.6 cm) Weight: 90.7 kg   Insurance Information HMO: yes    PPO:      PCP:      IPA:      80/20:      OTHER:  PRIMARY: U[employer] Medicare      Policy#: [id number]      Subscriber: Pt.  CM Name:       Phone#: [phone]     Fax#: [phone] Pre-Cert#: [id number] approved for 13 days from 9/20 to 09/14/22     Employer: Not employed Benefits:  Phone #:  [phone]    Name: Checked on line Eff Date: 12/14/2021 - still active Deductible: $0 (does not have deductible) OOP Max: $3,600 ($20 met) CIR: $295/day co-pay for days 1-5, $0/day co-pay for days 6+ SNF: $0.00 Copayment per day for days 1-20; $196.00 Copayment per day for days 21-39; $0.00 Copayment per day for days 40-100 for Medicare-covered care/maximum 100 days/benefit period Outpatient: $20/visit co-pay Home Health:  100% coverage; limited by medical necessity DME: 80% coverage; 20% co-insurance Providers: in network   SECONDARY:       Policy#:      Phone#:    Development worker, community:       Phone#:    The Engineer, petroleum" for patients in Inpatient Rehabilitation Facilities with attached "Privacy Act [place] Records" was provided and verbally reviewed with: Patient and Family   Emergency Contact Information Contact Information       Name Relation Home Work Mobile    Benson Spouse [phone]   [phone]           Current Medical History  Patient Admitting Diagnosis: Fall s// polytrauma   History of Present Illness: Pt. Is a  71 year old woman  with PMH of anxiety, depression, and arthritis who presented as a level 2 after a fall down about 12 stairs 08/25/22. Pt. Sustained SAH, R elbow fx with dislocation, L distal radius and ulna fx, and R proximal humerus fx. S/p right radial head arthroplasty, R elbow LCL repair, R coronoid repair,  application of R UE external fixator for persistent instability, and ORIF of L radius and ulna fx 9/14. Pt. With external fixator for RUE, a splint on LUE, and is NWB on BUEs. Pt. Is permitted to use L hand for basic ADLs and self feeding. LUE digit motion as tolerated, no ROM restrictions to the L elbow and shoulder. For RUE, no lifting, permitted to move R digits and wrist as tolerated. Ortho with plans to return to OR around 9/28 (potentially a few days later) for shoulder replacement with Dr. Stann Mainland. Pt. Seen by PT/OT/SLP who recommend CIR to assist return to PLOF.    Patient's medical record from [facility]  has been reviewed by the rehabilitation admission coordinator and physician.   Past Medical History      Past Medical History:  Diagnosis Date   Anxiety     Arthritis     Depression     Hypertension        Has the patient had major surgery during 100 days prior to admission? Yes   Family History   family history includes Cancer in her mother.   Current Medications   Current Facility-Administered Medications:    acetaminophen (TYLENOL) tablet 1,000 mg, 1,000 mg, Oral, Q6H, Ainsley Spinner, PA-C, 1,000 mg at 09/03/22 0901  albuterol (PROVENTIL) (2.5 MG/3ML) 0.083% nebulizer solution 3 mL, 3 mL, Inhalation, Q4H PRN, Ainsley Spinner, PA-C   ascorbic acid (VITAMIN C) tablet 500 mg, 500 mg, Oral, Daily, Meuth, Brooke A, PA-C, 500 mg at 09/03/22 0900   atorvastatin (LIPITOR) tablet 20 mg, 20 mg, Oral, Daily, Meuth, Brooke A, PA-C, 20 mg at 09/03/22 0900   bisacodyl (DULCOLAX) suppository 10 mg, 10 mg, Rectal, Daily PRN, Ainsley Spinner, PA-C   Chlorhexidine Gluconate Cloth 2 % PADS 6 each, 6 each, Topical, Daily, Ainsley Spinner, PA-C, 6 each at 09/03/22 0867   cholecalciferol (VITAMIN D3) 25 MCG (1000 UNIT) tablet 1,000 Units, 1,000 Units, Oral, Daily, Greer Pickerel, MD, 1,000 Units at 09/03/22 0900   docusate sodium (COLACE) capsule 100 mg, 100 mg, Oral, BID, Ainsley Spinner, PA-C, 100  mg at 09/03/22 0901   DULoxetine (CYMBALTA) DR capsule 60 mg, 60 mg, Oral, Daily, Ainsley Spinner, PA-C, 60 mg at 09/03/22 0901   enoxaparin (LOVENOX) injection 30 mg, 30 mg, Subcutaneous, Q12H, Ainsley Spinner, PA-C, 30 mg at 09/03/22 0901   ferrous sulfate tablet 325 mg, 325 mg, Oral, Daily, Meuth, Brooke A, PA-C, 325 mg at 09/03/22 0901   gabapentin (NEURONTIN) capsule 300 mg, 300 mg, Oral, QHS, Ainsley Spinner, PA-C, 300 mg at 09/02/22 2119   hydrALAZINE (APRESOLINE) injection 10 mg, 10 mg, Intravenous, Q2H PRN, Ainsley Spinner, PA-C, 10 mg at 08/27/22 2353   HYDROmorphone (DILAUDID) injection 0.5 mg, 0.5 mg, Intravenous, Q4H PRN, Meuth, Brooke A, PA-C, 0.5 mg at 09/02/22 2258   levothyroxine (SYNTHROID) tablet 50 mcg, 50 mcg, Oral, Q0600, Ainsley Spinner, PA-C, 50 mcg at 09/03/22 0530   losartan (COZAAR) tablet 100 mg, 100 mg, Oral, Daily, Meuth, Brooke A, PA-C, 100 mg at 09/03/22 0901   melatonin tablet 5 mg, 5 mg, Oral, QHS PRN, Meuth, Brooke A, PA-C, 5 mg at 09/02/22 2120   methocarbamol (ROBAXIN) tablet 1,000 mg, 1,000 mg, Oral, Q8H, Ainsley Spinner, PA-C, 1,000 mg at 09/03/22 0530   metoprolol tartrate (LOPRESSOR) injection 5 mg, 5 mg, Intravenous, Q6H PRN, Ainsley Spinner, PA-C   ondansetron (ZOFRAN-ODT) disintegrating tablet 4 mg, 4 mg, Oral, Q6H PRN **OR** ondansetron (ZOFRAN) injection 4 mg, 4 mg, Intravenous, Q6H PRN, Ainsley Spinner, PA-C   oxyCODONE (Oxy IR/ROXICODONE) immediate release tablet 10 mg, 10 mg, Oral, Q4H PRN, Ainsley Spinner, PA-C, 10 mg at 09/02/22 2120   oxyCODONE (Oxy IR/ROXICODONE) immediate release tablet 5 mg, 5 mg, Oral, Q4H PRN, Ainsley Spinner, PA-C, 5 mg at 09/03/22 0900   pantoprazole (PROTONIX) EC tablet 40 mg, 40 mg, Oral, Daily, Greer Pickerel, MD, 40 mg at 09/03/22 0901   polyethylene glycol (MIRALAX / GLYCOLAX) packet 17 g, 17 g, Oral, Daily, Meuth, Brooke A, PA-C, 17 g at 09/03/22 0901   propranolol (INDERAL) tablet 10 mg, 10 mg, Oral, BID, Meuth, Brooke A, PA-C, 10 mg at 09/03/22 0901    sodium chloride flush (NS) 0.9 % injection 10-40 mL, 10-40 mL, Intracatheter, Q12H, Meuth, Brooke A, PA-C, 30 mL at 09/03/22 0901   sodium chloride flush (NS) 0.9 % injection 10-40 mL, 10-40 mL, Intracatheter, PRN, Meuth, Brooke A, PA-C   Patients Current Diet:  Diet Order                  Diet regular Room service appropriate? Yes; Fluid consistency: Thin  Diet effective now                         Precautions / Restrictions Precautions  Precautions: Fall, Other (comment) Precaution Comments: external fixator R UE Restrictions Weight Bearing Restrictions: Yes RUE Weight Bearing: Non weight bearing LUE Weight Bearing: Weight bear through elbow only Other Position/Activity Restrictions: LUE: permitted to use L hand for basic ADLs and feed self; L UE digit motion as tolerated, no ROM restrictions to L elbow and shoulder. RUE: no lifting with R UE; permitted to move R digits and wrist as tolerated    Has the patient had 2 or more falls or a fall with injury in the past year? Yes   Prior Activity Level Community (5-7x/wk): Pt active in the community PTA   Prior Functional Level Self Care: Did the patient need help bathing, dressing, using the toilet or eating? Independent   Indoor Mobility: Did the patient need assistance with walking from room to room (with or without device)? Independent   Stairs: Did the patient need assistance with internal or external stairs (with or without device)? Independent   Functional Cognition: Did the patient need help planning regular tasks such as shopping or remembering to take medications? Independent   Patient Information Are you of Hispanic, Latino/a,or Spanish origin?: A. No, not of Hispanic, Latino/a, or Spanish origin What is your race?: A. White Do you need or want an interpreter to communicate with a doctor or health care staff?: 0. No   Patient's Response To:  Health Literacy and Transportation Is the patient able to respond to  health literacy and transportation needs?: Yes Health Literacy - How often do you need to have someone help you when you read instructions, pamphlets, or other written material from your doctor or pharmacy?: Never In the past 12 months, has lack of transportation kept you from medical appointments or from getting medications?: No In the past 12 months, has lack of transportation kept you from meetings, work, or from getting things needed for daily living?: No   Home Assistive Devices / Equipment Home Equipment: Conservation officer, nature (2 wheels), [employer] - single point, Contractor Use: Indicate devices/aids used by the patient prior to current illness, exacerbation or injury? None of the above   Current Functional Level Cognition   Arousal/Alertness: Awake/alert Overall Cognitive Status: Impaired/Different from baseline Current Attention Level: Alternating Orientation Level: Oriented X4 Following Commands: Follows multi-step commands consistently, Follows multi-step commands with increased time Safety/Judgement: Decreased awareness of safety, Decreased awareness of deficits General Comments: Pt with difficulty dual-tasking, making x3 mistakes counting backwards by 3s from 30 and displaying increased instability and R foot drag when ambulating with cognitive challenge. A&Ox4 though and more aware of her deficits and safety today with less repetition of comments noted. Attention: Focused, Sustained Focused Attention: Appears intact Sustained Attention: Impaired Sustained Attention Impairment: Verbal complex Memory: Impaired Memory Impairment:  (Immediate: 5/5 with repetition x5; delayed:) Awareness: Impaired Awareness Impairment: Emergent impairment Problem Solving: Impaired Problem Solving Impairment: Verbal complex (money: 3/3 with repetition; time) Executive Function: Writer: Impaired Organizing Impairment: Verbal complex (backward digit span: 1/2) [employer] Rx Scales of Cognitive Functioning: Purposeful, Appropriate    Extremity Assessment (includes Sensation/Coordination)   Upper Extremity Assessment: RUE deficits/detail, LUE deficits/detail RUE Deficits / Details: Ex fix immobilizing elbow. Able to perform digit opposition, digit flex/ext, and wrist ROM. RUE: Unable to fully assess due to pain, Unable to fully assess due to immobilization RUE Sensation: WNL RUE Coordination: decreased fine motor, decreased gross motor LUE Deficits / Details: full shoulder ROM, able to wiggle fingers. denies sensation changes. sx planned 9/14  LUE Sensation: WNL LUE Coordination: decreased fine motor  Lower Extremity Assessment: Defer to PT evaluation RLE Deficits / Details: MMT scores of 4- hip flexion (4+ on L), 4 knee extension (5 on L), 5 ankle dorsiflexion (5 on L); denies numbness/tingling bil, able to detect location of touch RLE Sensation: WNL     ADLs   Overall ADL's : Needs assistance/impaired Eating/Feeding: Moderate assistance, Sitting Grooming: Wash/dry hands, Adhering to UE precautions, Cueing for safety, Standing, Min guard Grooming Details (indicate cue type and reason): at the sink Upper Body Bathing: Maximal assistance, Sitting Lower Body Bathing: Maximal assistance, Sit to/from stand Upper Body Dressing : Maximal assistance, Sitting Lower Body Dressing: Maximal assistance, Sit to/from stand Toilet Transfer: Min guard, Ambulation, Regular Toilet Toileting- Clothing Manipulation and Hygiene: Maximal assistance, Sit to/from stand Toileting - Clothing Manipulation Details (indicate cue type and reason): in standing Functional mobility during ADLs: Min guard, Cueing for safety General ADL Comments: pt with great recall of education from prior sessions. close min G for all standing and OOB tasks. increased time needed for safety.     Mobility   Overal bed mobility: Needs Assistance Bed Mobility: Sit to Supine Supine to sit: Min assist,  HOB elevated Sit to supine: Min assist General bed mobility comments: Sitting up in chair upon arrival.     Transfers   Overall transfer level: Needs assistance Equipment used: 1 person hand held assist Transfers: Sit to/from Stand Sit to Stand: Min assist, Mod assist Bed to/from chair/wheelchair/BSC transfer type:: Step pivot Step pivot transfers: Mod assist General transfer comment: Pt recalling to scoot to edge of seat, minA under L elbow to power up to stand and steady transferring from chair the first 3x, but then modA as pt fatigued the final x3 bouts.     Ambulation / Gait / Stairs / Wheelchair Mobility   Ambulation/Gait Ambulation/Gait assistance: Herbalist (Feet): 204 Feet Assistive device: 1 person hand held assist Gait Pattern/deviations: Step-through pattern, Decreased stride length, Wide base of support, Decreased dorsiflexion - right, Decreased step length - right General Gait Details: Pt with slow, small steps, with good heel strike carryover from previous session. Pt cued to look superiorly and not at feet. Improved speed, but still slow. Pt slows speed or displays slightly increased lateral trunk sway when turning head L <> R or looking up <> down. SLight instability when changing speeds. Decreased R step length and foot clearance along with increased instability and slower speed when cued to dual task by counting backwards by 3s from 30 while ambulating. x2 LOB, minA for stability and recovery. Gait velocity: decreased Gait velocity interpretation: <1.31 ft/sec, indicative of household ambulator Pre-gait activities: Marching in place in front of recliner 2x 4-6 reps with modA, LOB due to R knee buckle and trunk sway, needing assistance to recover and cue pt to sit as needed for safety. Cues provided to slow down the weight shift to focus on extending R knee.     Posture / Balance Balance Overall balance assessment: Needs assistance Sitting-balance support:  Feet supported, No upper extremity supported Sitting balance-Leahy Scale: Good Standing balance support: Single extremity supported, No upper extremity supported, During functional activity Standing balance-Leahy Scale: Poor Standing balance comment: Reliant on support     Special needs/care consideration Skin Has right and left arm wounds and dressings in place and Special service needs potentially return to the OR on 09/10/22 for a shoulder replacement.  Hope to DC home after shoulder replacement.  Previous Home Environment (from acute therapy documentation) Living Arrangements: Spouse/significant other  Lives With: Spouse Available Help at Discharge: Family, Available 24 hours/day Type of Home: House Home Layout: Two level, Bed/bath upstairs Alternate Level Stairs-Number of Steps: flight Home Access: Stairs to enter Entrance Stairs-Rails: None Entrance Stairs-Number of Steps: 3 Bathroom Shower/Tub: Multimedia programmer: Standard Bathroom Accessibility: Yes How Accessible: Accessible via walker Additional Comments: lives with husband for 24/7 support. no bedroom downstairs, 3x lift recliner chairs (she can sleep in) and 1/2 bath downstairs   Discharge Living Setting Plans for Discharge Living Setting: Patient's home Type of Home at Discharge: House Discharge Home Layout: Two level, Bed/bath upstairs Alternate Level Stairs-Rails: None Alternate Level Stairs-Number of Steps: 3 Discharge Home Access: Stairs to enter Entrance Stairs-Rails: None Entrance Stairs-Number of Steps: 3 Discharge Bathroom Shower/Tub: Tub/shower unit Discharge Bathroom Toilet: Standard Discharge Bathroom Accessibility: Yes How Accessible: Accessible via walker   Social/Family/Support Systems Patient Roles: Spouse Contact Information: [phone] Anticipated Caregiver: Migdalia Dk Anticipated Caregiver's Contact Information: [phone] Ability/Limitations of Caregiver: 24/7 Caregiver  Availability: 24/7 Discharge Plan Discussed with Primary Caregiver: Yes Is Caregiver In Agreement with Plan?: Yes Does Caregiver/Family have Issues with Lodging/Transportation while Pt is in Rehab?: Yes   Goals Patient/Family Goal for Rehab: PT/OT/SLP Supervision Expected length of stay: 12-14 days Pt/Family Agrees to Admission and willing to participate: Yes Program Orientation Provided & Reviewed with Pt/Caregiver Including Roles  & Responsibilities: Yes   Decrease burden of Care through IP rehab admission: Specialzed equipment needs, Decrease number of caregivers, Bowel and bladder program, and Patient/family education   Possible need for SNF placement upon discharge: not stated   Patient Condition: I have reviewed medical records from [facility] , spoken with CM, and patient. I met with patient at the bedside for inpatient rehabilitation assessment.  Patient will benefit from ongoing PT, OT, and SLP, can actively participate in 3 hours of therapy a day 5 days of the week, and can make measurable gains during the admission.  Patient will also benefit from the coordinated team approach during an Inpatient Acute Rehabilitation admission.  The patient will receive intensive therapy as well as Rehabilitation physician, nursing, social worker, and care management interventions.  Due to safety, skin/wound care, disease management, medication administration, pain management, and patient education the patient requires 24 hour a day rehabilitation nursing.  The patient is currently min to mod assist with mobility and basic ADLs.  Discharge setting and therapy post discharge at home with home health is anticipated.  Patient has agreed to participate in the Acute Inpatient Rehabilitation Program and will admit today.   Preadmission Screen Completed By:  Retta Diones, 09/03/2022 10:38 AM ______________________________________________________________________   Discussed status with Dr.  on 09/03/22 at 0945 and received approval for admission today.   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 Yes Co-Morbidities requiring supervision/potential complications: wound care, pain mgt, Due to bladder management, bowel management, safety, skin/wound care, disease management, medication administration, pain management, and patient education, does the patient require 24 hr/day rehab nursing? Yes Does the patient require coordinated care of a physician, rehab nurse, PT, OT, and SLP to address physical and functional deficits in  the context of the above medical diagnosis(es)? Yes Addressing deficits in the following areas: balance, endurance, locomotion, strength, transferring, bowel/bladder control, bathing, dressing, feeding, grooming, toileting, cognition, and psychosocial support Can the patient actively participate in an intensive therapy program of at least 3 hrs of therapy 5 days a week? Yes The potential for patient to make measurable gains while on inpatient rehab is excellent Anticipated functional outcomes upon discharge from inpatient rehab: supervision PT, supervision and mod assist OT, modified independent and supervision SLP Estimated rehab length of stay to reach the above functional goals is: 12-14 days Anticipated discharge destination:  back to surgery for right TSA 10. Overall Rehab/Functional Prognosis: good     MD Signature: Meredith Staggers, MD, [place] Director Rehabilitation Services 09/03/2022          Revision History                                                Note Details  Author Meredith Staggers, MD File Time 09/03/2022 10:51 AM  Author Type Physician Status Signed  Last Editor Meredith Staggers, MD Service CASE MANAGEMENT

## 2022-09-03 NOTE — Progress Notes (Signed)
Admission/assessment complete.  Yehuda Mao, LPN

## 2022-09-03 NOTE — Progress Notes (Signed)
Physical Therapy Treatment Patient Details Name: Vanessa [name] MRN: [id number] DOB: May 09, 1951 Today's Date: 09/03/2022   History of Present Illness Pt is a 71 yo female who presented to [facility] on 08/25/22 after falling down 12 stairs at her home and sustaining SAH, R elbow fx with dislocation, L distal radius and ulna fx, and R proximal humerus fx. S/p right radial head arthroplasty, R elbow LCL repair, R coronoid repair, application of R UE external fixator for persistent instability, and ORIF of L radius and ulna fx 9/14. PMH: HTN, anxiety, depression, arthritis.    PT Comments    Pt is demonstrating improved gait speed and stability without challenges in an open space, but when faced with dual-tasking or dynamic challenges pt has LOB or change in gait speed, needing minA to prevent LOB. Pt made several cognitive mistakes and displayed decreased R step length and foot clearance when trying to perform cognitive tasks while ambulating. Pt also performed 4 reps in a 30 second STS test, indicating she is at risk for falls (score of <11 is indicative of risk for falls in her age population). Will continue to follow acutely. Current recommendations remain appropriate.    Recommendations for follow up therapy are one component of a multi-disciplinary discharge planning process, led by the attending physician.  Recommendations may be updated based on patient status, additional functional criteria and insurance authorization.  Follow Up Recommendations  Acute inpatient rehab (3hours/day)     Assistance Recommended at Discharge Frequent or constant Supervision/Assistance  Patient can return home with the following Assistance with cooking/housework;Help with stairs or ramp for entrance;Assist for transportation;Direct supervision/assist for financial management;Direct supervision/assist for medications management;A lot of help with bathing/dressing/bathroom;A lot of help with walking and/or transfers    Equipment Recommendations  Other (comment) (defer to post acute)    Recommendations for Other Services       Precautions / Restrictions Precautions Precautions: Fall;Other (comment) Precaution Comments: external fixator R UE Required Braces or Orthoses: Splint/Cast Splint/Cast: external fixator R UE; splint L UE Splint/Cast - Date Prophylactic Dressing Applied (if applicable): 08/25/22 Restrictions Weight Bearing Restrictions: Yes RUE Weight Bearing: Non weight bearing LUE Weight Bearing: Weight bear through elbow only Other Position/Activity Restrictions: LUE: permitted to use L hand for basic ADLs and feed self; L UE digit motion as tolerated, no ROM restrictions to L elbow and shoulder. RUE: no lifting with R UE; permitted to move R digits and wrist as tolerated     Mobility  Bed Mobility               General bed mobility comments: Sitting up in chair upon arrival.    Transfers Overall transfer level: Needs assistance Equipment used: 1 person hand held assist Transfers: Sit to/from Stand Sit to Stand: Min assist, Mod assist           General transfer comment: Pt recalling to scoot to edge of seat, minA under L elbow to power up to stand and steady transferring from chair the first 3x, but then modA as pt fatigued the final x3 bouts.    Ambulation/Gait Ambulation/Gait assistance: Min assist Gait Distance (Feet): 204 Feet Assistive device: 1 person hand held assist Gait Pattern/deviations: Step-through pattern, Decreased stride length, Wide base of support, Decreased dorsiflexion - right, Decreased step length - right Gait velocity: decreased Gait velocity interpretation: <1.31 ft/sec, indicative of household ambulator   General Gait Details: Pt with slow, small steps, with good heel strike carryover from previous session. Pt cued  to look superiorly and not at feet. Improved speed, but still slow. Pt slows speed or displays slightly increased lateral trunk  sway when turning head L <> R or looking up <> down. SLight instability when changing speeds. Decreased R step length and foot clearance along with increased instability and slower speed when cued to dual task by counting backwards by 3s from 30 while ambulating. x2 LOB, minA for stability and recovery.   Stairs             Wheelchair Mobility    Modified Rankin (Stroke Patients Only)       Balance Overall balance assessment: Needs assistance Sitting-balance support: Feet supported, No upper extremity supported Sitting balance-Leahy Scale: Good     Standing balance support: Single extremity supported, No upper extremity supported, During functional activity Standing balance-Leahy Scale: Poor Standing balance comment: Reliant on support                            Cognition Arousal/Alertness: Awake/alert Behavior During Therapy: WFL for tasks assessed/performed Overall Cognitive Status: Impaired/Different from baseline Area of Impairment: Following commands, Safety/judgement, Awareness, Problem solving, Rancho level, Attention, Memory               Rancho Levels of Cognitive Functioning Rancho Los Amigos Scales of Cognitive Functioning: Purposeful, Appropriate   Current Attention Level: Alternating Memory: Decreased short-term memory Following Commands: Follows multi-step commands consistently, Follows multi-step commands with increased time Safety/Judgement: Decreased awareness of safety, Decreased awareness of deficits Awareness: Emergent Problem Solving: Difficulty sequencing, Requires verbal cues, Slow processing General Comments: Pt with difficulty dual-tasking, making x3 mistakes counting backwards by 3s from 30 and displaying increased instability and R foot drag when ambulating with cognitive challenge. A&Ox4 though and more aware of her deficits and safety today with less repetition of comments noted.   Rancho [employer] Scales of Cognitive  Functioning: Purposeful, Appropriate    Exercises Other Exercises Other Exercises: sit <> stand 5x in a row with min-modA    General Comments General comments (skin integrity, edema, etc.): HR up to 130s; 30 second STS = 4 reps with min-modA      Pertinent Vitals/Pain Pain Assessment Pain Assessment: 0-10 Pain Score: 8  Pain Location: right shoulder Pain Descriptors / Indicators: Discomfort, Guarding, Operative site guarding Pain Intervention(s): Limited activity within patient's tolerance, Monitored during session, Repositioned, Other (comment) (pt reports RN obtaining pain meds for her currently)    Home Living                          Prior Function            PT Goals (current goals can now be found in the care plan section) Acute Rehab PT Goals Patient Stated Goal: to go to [facility] PT Goal Formulation: With patient Time For Goal Achievement: 09/09/22 Potential to Achieve Goals: Good Progress towards PT goals: Progressing toward goals    Frequency    Min 5X/week      PT Plan Current plan remains appropriate    Co-evaluation              AM-PAC PT "6 Clicks" Mobility   Outcome Measure  Help needed turning from your back to your side while in a flat bed without using bedrails?: A Little Help needed moving from lying on your back to sitting on the side of a flat bed without using bedrails?: A  Little Help needed moving to and from a bed to a chair (including a wheelchair)?: A Lot Help needed standing up from a chair using your arms (e.g., wheelchair or bedside chair)?: A Lot Help needed to walk in hospital room?: A Little Help needed climbing 3-5 steps with a railing? : A Lot 6 Click Score: 15    End of Session Equipment Utilized During Treatment: Gait belt Activity Tolerance: Patient tolerated treatment well Patient left: in chair;with call bell/phone within reach Nurse Communication: Mobility status PT Visit Diagnosis: Unsteadiness on feet  (R26.81);Other abnormalities of gait and mobility (R26.89);Muscle weakness (generalized) (M62.81);Difficulty in walking, not elsewhere classified (R26.2);Pain Pain - Right/Left: Right Pain - part of body: Arm     Time: 1761-6073 PT Time Calculation (min) (ACUTE ONLY): 15 min  Charges:  $Gait Training: 8-22 mins                     Raymond Gurney, PT, DPT Acute Rehabilitation Services  Office: [phone]    [name] 09/03/2022, 9:07 AM

## 2022-09-03 NOTE — Discharge Summary (Signed)
[facility] Discharge Summary   Patient ID: ERIA LOZOYA MRN: [id number] DOB/AGE: [birth year] 71 y.o.  Admit date: 08/25/2022 Discharge date: 09/03/2022   Discharge Diagnosis Fall down stairs SDH, SAH  Right proximal humerus fracture Right elbow fracture/dislocation Left wrist fracture Acute blood loss anemia HTN  HLD  Hypothyroidism  GERD  Anxiety/depression   Consultants Orthopedics  Imaging: No results found.  Procedures Dr. Marcelino Scot (08/27/2022) - Left distal radius and ulna fracture s/p ORIF; radial head arthroplasty, LCL repair, coronoid repair, application of external fixator   Hospital Course:  KYLAR SPEELMAN is a 71 y.o. female who presented to [facility] 9/12 as a level 2 after a fall down about 12 stairs earlier this morning. She does not recall the event. Reports headache and R arm pain. Per her husband she was more altered initially.  Workup revealed the below listed injuries which were managed as follows:    SDH, [place]  Neurosurgery was consulted and and recommended no neurosurgical intervention. Follow up CT head negative. Keppra for seizure prophylaxis x7 days. SLP eval completed Right proximal humerus fracture  Orthopedics evaluated and recommend total arthoplasty with Dr. Stann Mainland - this is scheduled 9/28 at [facility]. Right elbow fracture/dislocation  Orthopedics was consulted and took to the OR with Dr. Marcelino Scot 9/14 for external fixator. NWB RUE. CT of the elbow was obtained postoperatively. Ortho plans to continue ex fix for 4-6 weeks before returning to surgery. Follow up with Dr. Marcelino Scot. Left wrist fracture  Orthopedics was consulted and took the patient to the OR for ORIF 9/14. NWB LUE Acute blood loss anemia Hemoglobin as low as 7.5, but this stabilized and improved without the need for blood transfusion. She was started on  8.6, stable. Continue daily iron and vitamin C.  Patient worked with therapies during this admission who recommended  inpatient rehab. On 9/21 the patient was felt stable for discharge to CIR.  Patient will follow up as below and knows to call with questions or concerns.       Follow-up Information     Altamese Hobart, MD Follow up.   Specialty: Orthopedic Surgery Contact information: [place] [address] [phone]         Nicholes Stairs, MD Follow up.   Specialty: Orthopedic Surgery Contact information: [address] [place] [address] [phone]         Arvella Nigh, MD Follow up.   Specialty: Obstetrics and Gynecology Contact information: [address] [place] [address] [place] Follow up.   Why: As needed Contact information: [address] [place] [address] [phone]                 Signed: Wellington Hampshire, [facility] 09/03/2022, 11:43 AM Please see Amion for pager number during day hours 7:00am-4:30pm

## 2022-09-03 NOTE — Progress Notes (Signed)
IP rehab admissions - I have approval and I have a bed on CIR today.  Will admit to inpatient rehab today.  Call me for questions.  [phone]

## 2022-09-03 NOTE — Progress Notes (Signed)
Report given to [facility] in CIR all questions answered, will call back when room is ready.

## 2022-09-04 DIAGNOSIS — S52502S Unspecified fracture of the lower end of left radius, sequela: Secondary | ICD-10-CM | POA: Diagnosis not present

## 2022-09-04 DIAGNOSIS — S069X3S Unspecified intracranial injury with loss of consciousness of 1 hour to 5 hours 59 minutes, sequela: Secondary | ICD-10-CM | POA: Diagnosis not present

## 2022-09-04 DIAGNOSIS — T1490XA Injury, unspecified, initial encounter: Secondary | ICD-10-CM | POA: Diagnosis not present

## 2022-09-04 DIAGNOSIS — S42401S Unspecified fracture of lower end of right humerus, sequela: Secondary | ICD-10-CM | POA: Diagnosis not present

## 2022-09-04 DIAGNOSIS — S069X3A Unspecified intracranial injury with loss of consciousness of 1 hour to 5 hours 59 minutes, initial encounter: Principal | ICD-10-CM

## 2022-09-04 LAB — COMPREHENSIVE METABOLIC PANEL
ALT: 21 U/L (ref 0–44)
AST: 20 U/L (ref 15–41)
Albumin: 2.7 g/dL — ABNORMAL LOW (ref 3.5–5.0)
Alkaline Phosphatase: 64 U/L (ref 38–126)
Anion gap: 5 (ref 5–15)
BUN: 15 mg/dL (ref 8–23)
CO2: 27 mmol/L (ref 22–32)
Calcium: 8.6 mg/dL — ABNORMAL LOW (ref 8.9–10.3)
Chloride: 107 mmol/L (ref 98–111)
Creatinine, Ser: 0.69 mg/dL (ref 0.44–1.00)
GFR, Estimated: >60 mL/min (ref >60)
Glucose, Bld: 122 mg/dL — ABNORMAL HIGH (ref 70–99)
Potassium: 3.6 mmol/L (ref 3.5–5.1)
Sodium: 139 mmol/L (ref 135–145)
Total Bilirubin: 1 mg/dL (ref 0.3–1.2)
Total Protein: 5.4 g/dL — ABNORMAL LOW (ref 6.5–8.1)

## 2022-09-04 LAB — CBC WITH DIFFERENTIAL/PLATELET
Abs Immature Granulocytes: 0.08 10*3/uL — ABNORMAL HIGH (ref 0.00–0.07)
Basophils Absolute: 0 10*3/uL (ref 0.0–0.1)
Basophils Relative: 1 %
Eosinophils Absolute: 0.2 10*3/uL (ref 0.0–0.5)
Eosinophils Relative: 4 %
HCT: 26.6 % — ABNORMAL LOW (ref 36.0–46.0)
Hemoglobin: 8.6 g/dL — ABNORMAL LOW (ref 12.0–15.0)
Immature Granulocytes: 2 %
Lymphocytes Relative: 24 %
Lymphs Abs: 1.2 10*3/uL (ref 0.7–4.0)
MCH: 33.2 pg (ref 26.0–34.0)
MCHC: 32.3 g/dL (ref 30.0–36.0)
MCV: 102.7 fL — ABNORMAL HIGH (ref 80.0–100.0)
Monocytes Absolute: 0.5 10*3/uL (ref 0.1–1.0)
Monocytes Relative: 9 %
Neutro Abs: 3.2 10*3/uL (ref 1.7–7.7)
Neutrophils Relative %: 60 %
Platelets: 170 10*3/uL (ref 150–400)
RBC: 2.59 MIL/uL — ABNORMAL LOW (ref 3.87–5.11)
RDW: 16.9 % — ABNORMAL HIGH (ref 11.5–15.5)
WBC: 5.2 10*3/uL (ref 4.0–10.5)
nRBC: 0.6 % — ABNORMAL HIGH (ref 0.0–0.2)

## 2022-09-04 LAB — IRON AND TIBC
Iron: 62 ug/dL (ref 28–170)
Saturation Ratios: 24 % (ref 10.4–31.8)
TIBC: 255 ug/dL (ref 250–450)
UIBC: 193 ug/dL

## 2022-09-04 LAB — FOLATE: Folate: 9.3 ng/mL (ref >5.9)

## 2022-09-04 LAB — RETICULOCYTES
Immature Retic Fract: 34.1 % — ABNORMAL HIGH (ref 2.3–15.9)
RBC.: 2.72 MIL/uL — ABNORMAL LOW (ref 3.87–5.11)
Retic Count, Absolute: 213 10*3/uL — ABNORMAL HIGH (ref 19.0–186.0)
Retic Ct Pct: 7.8 % — ABNORMAL HIGH (ref 0.4–3.1)

## 2022-09-04 LAB — FERRITIN: Ferritin: 168 ng/mL (ref 11–307)

## 2022-09-04 LAB — VITAMIN B12: Vitamin B-12: 254 pg/mL (ref 180–914)

## 2022-09-04 MED ORDER — CHLORHEXIDINE GLUCONATE CLOTH 2 % EX PADS
6.0000 | MEDICATED_PAD | Freq: Two times a day (BID) | CUTANEOUS | Status: DC
  Administered 2022-09-04 – 2022-09-10 (×12): 6 via TOPICAL

## 2022-09-04 NOTE — Progress Notes (Signed)
09/04/22 1600  Clinical Encounter Type  Visited With Patient  Visit Type Initial  Referral From Physician;Nurse Ivan Anchors. Love, PA, Erica A. Doran Durand, LPN)  Consult/Referral To Chaplain Melvenia Beam)  Recommendations Advance Directive Education   Chaplain responded to spiritual care consultation requesting assistance for Advance Directive preparation with Ms. Glennie Hawk. Chaplain met with Ms. Wallenstein at patient's bedside.   Ms. Forge stated that she does NOT have a court appointed [place].  Ms. Kontos stated that she IS Married.   Ms. Paschal indicated that she intends to list her Husband: Mr. Karma Ansley [phone] as her [place].   Chaplain provided the Advance Directive packet as well as education on Advance Directives-documents an individual completes to communicate their health care directions in advance of a time when they may need them. Chaplain informed Ms. Bise the documents which may be completed here in the hospital are the Living Will and [place].   Chaplain informed Ms. Loe that the [place] is a legal document in which an individual names another person, as their [place], to communicate her health care decisions, when she is not able to communicate them for herself. The Health Care Agent's function can be temporary or permanent depending on her ability to make and communicate those decisions independently.   Chaplain informed Ms. Swindell in the absence of a [place], the state of [place] directs health care providers to look to the following individuals in the order listed: legal guardian; an attorney-in-fact under a general power of attorney (POA) if that POA includes the right to make health care decisions; person's spouse; a 39 of her adult children; a 14 of adult brothers and sisters;  or an individual who has an established relationship with you, who is acting in good faith and who can convey your wishes.  If none of these persons are available or willing to make medical decisions on a patient's behalf, the law allows the patient's doctor to make decisions for them as long as another doctor agrees with those decisions.  Chaplain also informed the patient that the Health Care agent has no decision-making authority over any affairs other than those related to her medical care.   The chaplain further educated Ms. Mcbeth that a Living Will is a legal document that allows her desires not to receive life-prolonging measures in the event that she has a condition that is incurable and will result in her death in a short period of time; they are unconscious, and doctors are confident that she will not regain consciousness; and/or she has advanced dementia or other substantial and irreversible loss of mental function.   The chaplain informed Ms. Upperman that life-prolonging measures are medical treatments that would only serve to postpone death, including breathing machines, kidney dialysis, antibiotics, artificial nutrition and hydration (tube feeding), and similar forms of treatment and that if an individual is able to express their wishes, they may also make them known without the use of a Living Will, but in the event that she is not able to express her wishes, a Living Will allows medical providers and the her family and friends to ensure that they are not making decisions on her behalf, but rather serving as her voice to convey decisions that she has already made.   Ms. Ferrall is aware that the decision to create an  advance directive is hers alone and she may choose not to complete the documents or may choose to complete one portion or both.  Ms. Yetman was informed that she can revoke the documents at any time by striking through them and writing void or by completing new documents, but  that it is also advisable that the individual verbally notify interested parties that her wishes have changed.   Ms. Finks is also aware that the document must be signed in the presence of a notary public and two witnesses and that this may be done while the patient is still admitted to the hospital or after discharge in the community. If they decide to complete Advance Directives after being discharged from the hospital, they have been advised to notify all interested parties and to provide those documents to their physicians and loved ones in addition to bringing them to the hospital in the event of another hospitalization.   The chaplain informed the Ms. Steege that if she desires to proceed with completing the Advance Directive Documentation while she is still admitted, notary services are typically available at [facility] between the hours of 1:00 and 3:30 Monday-Thursday.   When the patient is ready to have these documents completed, the patient should request that their nurse place a spiritual care consult and indicate that the patient is ready to have their advance directives notarized so that arrangements for witnesses and notary public can be made.  If there is an immediate need to have notarized please page the Chaplain.   Please page spiritual care if the patient desires further education or has questions.   [address] [place], [place] [phone].

## 2022-09-04 NOTE — Progress Notes (Signed)
   09/04/22 1415  Clinical Encounter Type  Visited With Patient not available;Health care provider (Physical Therapist is with patient)  Visit Type Initial  Referral From Physician;Nurse Ivan Anchors. Love, PA, Erica A. Doran Durand, LPN)  Consult/Referral To Chaplain Albertina Parr Morene Crocker)  Recommendations Advance Directive   Spiritual Care Consultation List: "Create or update Advance Directive."  Attempted Advance Directive Education and signing. Patient is with Physical Therapist until 1515 +/-... Will attempt consultation with patient later. Blank Advance Directive is in Patient file at front desk.  [address] [place], Ivin Poot., [phone]

## 2022-09-04 NOTE — Evaluation (Signed)
Occupational Therapy Assessment and Plan  Patient Details  Name: Vanessa [name] MRN: [id number] Date of Birth: 1951-05-31  OT Diagnosis: acute pain, cognitive deficits, and muscle weakness (generalized) Rehab Potential: Rehab Potential (ACUTE ONLY): Good ELOS: 7 days   Today's Date: 09/04/2022 OT Individual Time: 2694-8546 OT Individual Time Calculation (min): 64 min     Hospital Problem: Principal Problem:   Traumatic brain injury with loss of consciousness of 1 hour to 5 hours 59 minutes ([place]) Active Problems:   Trauma   Past Medical History:  Past Medical History:  Diagnosis Date   Anxiety    Arthritis    Bladder prolapse, female, acquired    COVID-19 03/2022   Depression    Hypertension    Myalgia    Occasional tremors    OSA (obstructive sleep apnea)    mild   Restless leg syndrome    Thrombocytopenia ([place]) in the past    Past Surgical History:  Past Surgical History:  Procedure Laterality Date   FRACTURE SURGERY  2011   left ankle Fx s/p fixation   KNEE CARTILAGE SURGERY Right     Assessment & Plan Clinical Impression: Vanessa [name] is a 71 year old female with history of HTN, Tremors, myalgia, RLS w/insomnia, anxiety/depression who was admitted on 08/25/22 after being found at the bottom of 12 stairs with reports of headache and right arm pain. She does not recall events leading to fall but thinks that she taken double dose of sleeping pills, may have gotten disoriented, turned wrong way and fell down the stairs. She was noted to have right orbital hematoma with facial abrasions, gross deformities right elbow and left wrist, had mental status changes and was perseverative at admission. She was found to have trace left and parafalcine SDH with trace SAH, supraorbital scalp hematoma, displaced angulated right humerus head neck fx w Fx of glenoid rim/coracoid process, posterior dislocation of radius with comminution of proximal right radius and supracondylar Fx  of humerus, comminuted displaced fractures of distal left radius and ulna with radiocarpal joint involvement as well as incidental findings of advanced C6/C7 degeneration with moderate to severe foraminal stenosis and moderate facet arthropathy L4 and L5 with mild anterolisthesis L4 on L5. She was evaluated by ortho with reduction of right elbow and left distal/radius Fx with splinting and NWB. She was taken to OR on 08/27/22 for ORIF left distal radius and ulna Fxs and right radial head arthroplasty with right elbow ligament reconstruction by Dr. Marcelino Scot on 09/14. Post op NWB RUE and Left wrist--Ok to use left hand for self feed and ADLs but no axial loading. Plans for right total shoulder  arthroplasty by Dr. Stann Mainland next week. Patient transferred to CIR on 09/03/2022 .    Patient currently requires mod with basic self-care skills secondary to muscle weakness, decreased cardiorespiratoy endurance, decreased attention, decreased problem solving, decreased memory, and demonstrates behaviors consistent with Rancho Level VIII, and decreased standing balance, decreased postural control, decreased balance strategies, and difficulty maintaining precautions.  Prior to hospitalization, patient could complete ADLS with independent .  Patient will benefit from skilled intervention to decrease level of assist with basic self-care skills prior to discharge home with care partner.  Anticipate patient will require 24 hour supervision and follow up home health.  OT - End of Session Activity Tolerance: Tolerates 10 - 20 min activity with multiple rests Endurance Deficit: Yes Endurance Deficit Description: limited by pain and endurance OT Assessment Rehab Potential (ACUTE ONLY): Good OT  Barriers to Discharge: Inaccessible home environment OT Barriers to Discharge Comments: stairs to enter OT Patient demonstrates impairments in the following area(s): Balance;Cognition;Endurance;Edema;Motor;Pain;Safety OT Basic ADL's  Functional Problem(s): Bathing;Toileting;Dressing OT Transfers Functional Problem(s): Toilet OT Additional Impairment(s): None OT Plan OT Intensity: Minimum of 1-2 x/day, 45 to 90 minutes OT Frequency: 5 out of 7 days OT Duration/Estimated Length of Stay: 7 days OT Treatment/Interventions: Balance/vestibular training;DME/adaptive equipment instruction;Patient/family education;Therapeutic Activities;Wheelchair propulsion/positioning;Therapeutic Exercise;Cognitive remediation/compensation;Self Care/advanced ADL retraining;UE/LE Strength taining/ROM;Functional mobility training;Discharge planning;UE/LE Coordination activities;Pain management;Splinting/orthotics OT Self Feeding Anticipated Outcome(s): no goal OT Basic Self-Care Anticipated Outcome(s): (S) OT Toileting Anticipated Outcome(s): (S) OT Bathroom Transfers Anticipated Outcome(s): (S) OT Recommendation Patient destination: Home Follow Up Recommendations: Home health OT Equipment Recommended: To be determined   OT Evaluation Precautions/Restrictions  Precautions Precautions: Fall;Other (comment) Precaution Comments: external fixator R UE. RUE NWB. LUE no wrist weightbearing. Ok to move fingers bilaterally. Ok to use LUE for ADLs/self feeding Required Braces or Orthoses: Splint/Cast Splint/Cast: external fixator R UE; splint L UE Restrictions Weight Bearing Restrictions: Yes RUE Weight Bearing: Non weight bearing LUE Weight Bearing: Weight bear through elbow only Other Position/Activity Restrictions: LUE: permitted to use L hand for basic ADLs and feed self; L UE digit motion as tolerated, no ROM restrictions to L elbow and shoulder. RUE: no lifting with R UE; permitted to move R digits and wrist as tolerated General Chart Reviewed: Yes OT Amount of Missed Time: 10 Minutes Family/Caregiver Present: No Vital Signs Therapy Vitals Pulse Rate: 97 BP: 131/75 Pain Pain Assessment Pain Scale: 0-10 Pain Score: 0-No pain Pain  Location: Arm Pain Orientation: Right Pain Descriptors / Indicators: Aching Pain Intervention(s): Medication (See eMAR) Home Living/Prior Functioning Home Living Family/patient expects to be discharged to:: Private residence Living Arrangements: Spouse/significant other Available Help at Discharge: Family, Available 24 hours/day Type of Home: House Home Access: Stairs to enter [employer] of Steps: 4 stairs no rail Home Layout: Two level, Bed/bath upstairs Alternate Level Stairs-Number of Steps: flight Bathroom Shower/Tub: Multimedia programmer: Standard Bathroom Accessibility: Yes Additional Comments: lives with husband for 24/7 support. no bedroom downstairs, 3x Conservation officer, nature (she can sleep in) and 1/2 bath downstairs  Lives With: Spouse IADL History Homemaking Responsibilities: Yes Meal Prep Responsibility: Primary Laundry Responsibility: Primary Cleaning Responsibility: Primary Bill Paying/Finance Responsibility: Secondary IADL Comments: Rollators, canes, shower chair, over the toilet riser Prior Function Level of Independence: Independent with basic ADLs, Independent with transfers, Independent with gait Vocation: Retired Surveyor, mining Baseline Vision/History: 0 No visual deficits Ability to See in Adequate Light: 0 Adequate Patient Visual Report: No change from baseline Vision Assessment?: No apparent visual deficits Perception  Perception: Within Functional Limits Praxis Praxis: Intact Cognition Cognition Overall Cognitive Status: Impaired/Different from baseline Arousal/Alertness: Awake/alert Orientation Level: Person;Place;Situation Person: Oriented Place: Oriented Situation: Oriented Memory: Impaired Memory Impairment: Decreased recall of new information;Decreased short term memory Decreased Short Term Memory: Verbal basic;Functional basic Attention: Selective;Focused;Sustained Focused Attention: Appears intact Sustained Attention:  Appears intact Selective Attention: Impaired Selective Attention Impairment: Verbal complex Awareness: Impaired Awareness Impairment: Emergent impairment Problem Solving: Impaired Problem Solving Impairment: Verbal complex Executive Function: Writer: Impaired Organizing Impairment: Verbal complex (digit reversal 1/2) Safety/Judgment: Appears intact [place] [employer] Scales of Cognitive Functioning: Purposeful, Appropriate Brief Interview for Mental Status (BIMS) Repetition of Three Words (First Attempt): 3 Temporal Orientation: Year: Correct Temporal Orientation: Month: Accurate within 5 days Temporal Orientation: Day: Correct Recall: "Sock": No, could not recall Recall: "Blue": Yes, after cueing ("a color") Recall: "Bed": No,  could not recall BIMS Summary Score: 10 Sensation Sensation Light Touch: Appears Intact Hot/Cold: Appears Intact Proprioception: Appears Intact Coordination Gross Motor Movements are Fluid and Coordinated: No Fine Motor Movements are Fluid and Coordinated: No Coordination and Movement Description: BUE weightbearing and AROM restrictions Motor  Motor Motor: Within Functional Limits  Trunk/Postural Assessment  Cervical Assessment Cervical Assessment: Within Functional Limits Thoracic Assessment Thoracic Assessment: Within Functional Limits Lumbar Assessment Lumbar Assessment: Within Functional Limits Postural Control Postural Control: Deficits on evaluation Righting Reactions: delayed  Balance Balance Balance Assessed: Yes Static Sitting Balance Static Sitting - Balance Support: Feet supported Static Sitting - Level of Assistance: 6: Modified independent (Device/Increase time) Dynamic Sitting Balance Dynamic Sitting - Balance Support: Feet supported Dynamic Sitting - Level of Assistance: 6: Modified independent (Device/Increase time) Static Standing Balance Static Standing - Balance Support: During functional activity Static  Standing - Level of Assistance: 5: Stand by assistance Dynamic Standing Balance Dynamic Standing - Balance Support: No upper extremity supported Dynamic Standing - Level of Assistance: 4: Min assist Extremity/Trunk Assessment RUE Assessment RUE Assessment: Exceptions to [facility] General Strength Comments: Ok to move digits and wrist. Ext fixator in place at elbow LUE Assessment LUE Assessment: Exceptions to [facility] General Strength Comments: Maintain splint, ok to feed self/ADLs with hand. No restrictions at elbow or shoulders  Care Tool Care Tool Self Care Eating   Eating Assist Level: Set up assist    Oral Care    Oral Care Assist Level: Set up assist    Bathing   Body parts bathed by patient: Right arm;Left arm;Chest;Abdomen;Front perineal area;Right upper leg;Buttocks;Left upper leg;Face Body parts bathed by helper: Right lower leg;Left lower leg   Assist Level: Moderate Assistance - Patient 50 - 74%    Upper Body Dressing(including orthotics)   What is the patient wearing?: [place] only   Assist Level: Moderate Assistance - Patient 50 - 74%    Lower Body Dressing (excluding footwear)   What is the patient wearing?: Hospital gown only Assist for lower body dressing: Moderate Assistance - Patient 50 - 74%    Putting on/Taking off footwear   What is the patient wearing?: Non-skid slipper socks Assist for footwear: Dependent - Patient 0%       Care Tool Toileting Toileting activity   Assist for toileting: Moderate Assistance - Patient 50 - 74%     Care Tool Bed Mobility Roll left and right activity   Roll left and right assist level: Minimal Assistance - Patient > 75%    Sit to lying activity   Sit to lying assist level: Minimal Assistance - Patient > 75%    Lying to sitting on side of bed activity   Lying to sitting on side of bed assist level: the ability to move from lying on the back to sitting on the side of the bed with no back support.: Minimal Assistance -  Patient > 75%     Care Tool Transfers Sit to stand transfer   Sit to stand assist level: Minimal Assistance - Patient > 75%    Chair/bed transfer   Chair/bed transfer assist level: Minimal Assistance - Patient > 75%     Toilet transfer   Assist Level: Minimal Assistance - Patient > 75%     Care Tool Cognition  Expression of Ideas and Wants Expression of Ideas and Wants: 4. Without difficulty (complex and basic) - expresses complex messages without difficulty and with speech that is clear and easy to understand  Understanding Verbal  and Non-Verbal Content Understanding Verbal and Non-Verbal Content: 4. Understands (complex and basic) - clear comprehension without cues or repetitions   Memory/Recall Ability Memory/Recall Ability : Current season;Location of own room;Staff names and faces;That he or she is in a hospital/hospital unit   Refer to Care Plan for [place] 1 OT Short Term Goal 1 (Week 1): STG= LTG d/t ELOS  Recommendations for other services: None    Skilled Therapeutic Intervention ADL ADL Eating: Set up Where Assessed-Eating: Bed level Grooming: Moderate assistance Where Assessed-Grooming: Edge of bed Upper Body Bathing: Moderate assistance Where Assessed-Upper Body Bathing: Chair Lower Body Bathing: Minimal assistance Where Assessed-Lower Body Bathing: Chair Upper Body Dressing: Moderate assistance Where Assessed-Upper Body Dressing: Chair Lower Body Dressing: Moderate assistance Where Assessed-Lower Body Dressing: Chair Toileting: Minimal assistance Where Assessed-Toileting: Glass blower/designer: Psychiatric nurse Method: Arts development officer: Radiographer, therapeutic: Unable to assess Mobility  Bed Mobility Bed Mobility: Sit to Supine;Supine to Sit Supine to Sit: Contact Guard/Touching assist Sit to Supine: Contact Guard/Touching assist Transfers Sit to Stand: Contact  Guard/Touching assist Stand to Sit: Contact Guard/Touching assist   Skilled OT evaluation completed with the creation of pt centered OT POC. Pt educated on condition, ELOS, rehab expectations, and fall risk reduction strategies throughout session. Pt very pleasant to work with and motivated to return to PLOF. She demonstrated higher level cognitive deficits consistent with a RLAS VII-VIII. Pt required min cueing to adhere to precautions but overall had good awareness of use abilities. Provided education on elevating BUE for edema management. She completed Adls as described above. Pt very fatigued and not received pain medication and missed 10 min of session d/t this. Pt left supine with all needs met.     Discharge Criteria: Patient will be discharged from OT if patient refuses treatment 3 consecutive times without medical reason, if treatment goals not met, if there is a change in medical status, if patient makes no progress towards goals or if patient is discharged from hospital.  The above assessment, treatment plan, treatment alternatives and goals were discussed and mutually agreed upon: by patient  Curtis Sites 09/04/2022, 12:13 PM

## 2022-09-04 NOTE — Progress Notes (Signed)
Inpatient Rehabilitation  Patient information reviewed and entered into eRehab system by Shantoria Ellwood Yolinda Duerr, OTR/L, Rehab Quality Coordinator.   Information including medical coding, functional ability and quality indicators will be reviewed and updated through discharge.   

## 2022-09-04 NOTE — Evaluation (Signed)
Speech Language Pathology Assessment and Plan  Patient Details  Name: Vanessa [name] MRN: [id number] Date of Birth: 09-Jun-1951  SLP Diagnosis: Cognitive Impairments  Rehab Potential: Good ELOS: 7 days   Today's Date: 09/04/2022 SLP Individual Time: 0900-1000 SLP Individual Time Calculation (min): 60 min  Hospital Problem: Principal Problem:   Traumatic brain injury with loss of consciousness of 1 hour to 5 hours 59 minutes ([place]) Active Problems:   Trauma  Past Medical History:  Past Medical History:  Diagnosis Date   Anxiety    Arthritis    Bladder prolapse, female, acquired    COVID-19 03/2022   Depression    Hypertension    Myalgia    Occasional tremors    OSA (obstructive sleep apnea)    mild   Restless leg syndrome    Thrombocytopenia ([place]) in the past    Past Surgical History:  Past Surgical History:  Procedure Laterality Date   FRACTURE SURGERY  2011   left ankle Fx s/p fixation   KNEE CARTILAGE SURGERY Right     Assessment / Plan / Recommendation Clinical Impression  Vanessa [name] is a 71 year old female with history of HTN, Tremors, myalgia, RLS w/insomnia, anxiety/depression who was admitted on 08/25/22 after being found at the bottom of 12 stairs with reports of headache and right arm pain. She does not recall events leading to fall but thinks that she taken double dose of sleeping pills, may have gotten disoriented, turned wrong way and fell down the stairs. She was noted to have right orbital hematoma with facial abrasions, gross deformities right elbow and left wrist, had mental status changes and was perseverative at admission. She was found to have trace left and parafalcine SDH with trace SAH, supraorbital scalp hematoma, displaced angulated right humerus head neck fx w Fx of glenoid rim/coracoid process, posterior dislocation of radius with comminution of proximal right radius and supracondylar Fx of humerus, comminuted displaced fractures of distal  left radius and ulna with radiocarpal joint involvement as well as incidental findings of advanced C6/C7 degeneration with moderate to severe foraminal stenosis and moderate facet arthropathy L4 and L5 with mild anterolisthesis L4 on L5. She was evaluated by ortho with reduction of right elbow and left distal/radius Fx with splinting and NWB. She was taken to OR on 08/27/22 for ORIF left distal radius and ulna Fxs and right radial head arthroplasty with right elbow ligament reconstruction by Dr. Marcelino Scot on 09/14. Post op NWB RUE and Left wrist--Ok to use left hand for self feed and ADLs but no axial loading. Plans for right total shoulder arthroplasty by Dr. Stann Mainland next week. Patient transferred to CIR on 09/03/2022.  Pt presents with mild cognitive impairment as evidenced by SLUMS score of 15/26 (score adjusted to omit clock drawing due to NWB RUE). Pt is currently presenting as a Rancho VIII. Pt with primary deficits in immediate, delayed and working memory as well as paragraph comprehension. Pt oriented to all concepts and able to recall functional information with Supervision level cues from therapist and husband regarding recent events and hospital stay. Pt with emergent awareness of deficits, however husband reports her short term memory is more impaired than patient self-reports. Pt expressive/receptive/motor speech all WFL, reports higher level word finding challenges but much improved since admission (occurred 1x during evaluation). Pt was independent before fall, would like to maintain medication organization and home tasks she was managing prior. Pt will benefit from skilled ST to increase safety and independence with  daily routine in home environment.    Skilled Therapeutic Interventions          Pt participating in SLUMS assessment and further nonstandardized assessments of speech, language and cognition. Please see above.   SLP Assessment  Patient will need skilled Speech Lanaguage Pathology  Services during CIR admission    Recommendations  SLP Diet Recommendations: Age appropriate regular solids;Thin Liquid Administration via: Cup;Straw Medication Administration: Whole meds with liquid Supervision: Staff to assist with self feeding (set up A) Postural Changes and/or Swallow Maneuvers: Seated upright 90 degrees Oral Care Recommendations: Oral care BID Patient destination: Home Follow up Recommendations: Home Health SLP Equipment Recommended: None recommended by SLP    SLP Frequency 3 to 5 out of 7 days   SLP Duration  SLP Intensity  SLP Treatment/Interventions 7 days  Minumum of 1-2 x/day, 30 to 90 minutes  Cognitive remediation/compensation;Therapeutic Exercise;Therapeutic Activities;Patient/family education;Functional tasks;Cueing hierarchy;Environmental controls;Internal/external aids    Pain Pain Assessment Pain Scale: 0-10 Pain Score: 0-No pain Pain Location: Arm Pain Orientation: Right Pain Descriptors / Indicators: Aching Pain Intervention(s): Medication (See eMAR)  Prior Functioning Cognitive/Linguistic Baseline: Within functional limits Type of Home: House  Lives With: Spouse Available Help at Discharge: Family;Available 24 hours/day Vocation: Retired  Programmer, systems Overall Cognitive Status: Impaired/Different from baseline Arousal/Alertness: Awake/alert Orientation Level: Oriented X4 Year: 2023 Month: September Day of Week: Correct Attention: Selective;Focused;Sustained Focused Attention: Appears intact Sustained Attention: Appears intact Selective Attention: Impaired Selective Attention Impairment: Verbal complex Memory: Impaired Memory Impairment: Decreased recall of new information;Decreased short term memory Decreased Short Term Memory: Verbal basic;Functional basic Awareness: Impaired Awareness Impairment: Emergent impairment Problem Solving: Impaired Problem Solving Impairment: Verbal complex Executive Function:  Writer: Impaired Organizing Impairment: Verbal complex (digit reversal 1/2) Safety/Judgment: Appears intact [place] Scales of Cognitive Functioning: Purposeful, Appropriate  Comprehension Auditory Comprehension Overall Auditory Comprehension: Appears within functional limits for tasks assessed Yes/No Questions: Within Functional Limits Commands: Within Functional Limits Conversation: Complex Expression Expression Primary Mode of Expression: Verbal Verbal Expression Overall Verbal Expression: Appears within functional limits for tasks assessed Written Expression Dominant Hand: Right Oral Motor Oral Motor/Sensory Function Overall Oral Motor/Sensory Function: Within functional limits Motor Speech Overall Motor Speech: Appears within functional limits for tasks assessed  Care Tool Care Tool Cognition Ability to hear (with hearing aid or hearing appliances if normally used Ability to hear (with hearing aid or hearing appliances if normally used): 0. Adequate - no difficulty in normal conservation, social interaction, listening to TV   Expression of Ideas and Wants Expression of Ideas and Wants: 4. Without difficulty (complex and basic) - expresses complex messages without difficulty and with speech that is clear and easy to understand   Understanding Verbal and Non-Verbal Content Understanding Verbal and Non-Verbal Content: 4. Understands (complex and basic) - clear comprehension without cues or repetitions  Memory/Recall Ability Memory/Recall Ability : Current season;Location of own room;Staff names and faces;That he or she is in a hospital/hospital unit   Short Term Goals: Week 1: SLP Short Term Goal 1 (Week 1): STG = LTG d/t ELOS  Refer to Care Plan for Long Term Goals  Recommendations for other services: None   Discharge Criteria: Patient will be discharged from SLP if patient refuses treatment 3 consecutive times without medical reason, if treatment  goals not met, if there is a change in medical status, if patient makes no progress towards goals or if patient is discharged from hospital.  The above assessment, treatment plan, treatment alternatives and  goals were discussed and mutually agreed upon: by patient and by family  Dewaine Conger 09/04/2022, 12:26 PM

## 2022-09-04 NOTE — Progress Notes (Signed)
PROGRESS NOTE   Subjective/Complaints: Pt had a reasonable night. Slept well. Husband unfortunately couldn't get comfortable in recliner  ROS: Patient denies fever, rash, sore throat, blurred vision, dizziness, nausea, vomiting, diarrhea, cough, shortness of breath or chest pain,   headache, or mood change.    Objective:   No results found. Recent Labs    09/03/22 0550 09/04/22 0317  WBC 4.2 5.2  HGB 8.6* 8.6*  HCT 27.8* 26.6*  PLT 167 170   Recent Labs    09/04/22 0317  NA 139  K 3.6  CL 107  CO2 27  GLUCOSE 122*  BUN 15  CREATININE 0.69  CALCIUM 8.6*    Intake/Output Summary (Last 24 hours) at 09/04/2022 1002 Last data filed at 09/04/2022 0754 Gross per 24 hour  Intake 711.38 ml  Output --  Net 711.38 ml        Physical Exam: Vital Signs Blood pressure 131/75, pulse 97, temperature 98.5 F (36.9 C), temperature source Oral, resp. rate 18, height 5' 6.5" (1.689 m), weight 98.1 kg, SpO2 99 %.  General: Alert and oriented x 3, No apparent distress HEENT: Head is normocephalic, atraumatic, PERRLA, EOMI, sclera anicteric, oral mucosa pink and moist, dentition intact, ext ear canals clear,  Neck: Supple without JVD or lymphadenopathy Heart: Reg rate and rhythm. No murmurs rubs or gallops Chest: CTA bilaterally without wheezes, rales, or rhonchi; no distress Abdomen: Soft, non-tender, non-distended, bowel sounds positive. Extremities: No clubbing, cyanosis, or edema. Pulses are 2+ Psych: Pt's affect is appropriate. Pt is cooperative Skin: pinsites clean, dry. Bruises about RUE/shoulder. Ecchymoses below right eye.  Neuro:  Alert and oriented x 3. Normal insight and awareness. Intact Memory. Normal language and speech. Cranial nerve exam unremarkable. UE limited by ortho but able move left arm and wiggle fingers of both hands. LE's grossly 3-4/5. Sensory exam normal for light touch and pain in all 4 limbs. No  limb ataxia or cerebellar signs. No abnormal tone appreciated.   Musculoskeletal: RUE with ex-fix at elbow, left wrist with splint.     Assessment/Plan: 1. Functional deficits which require 3+ hours per day of interdisciplinary therapy in a comprehensive inpatient rehab setting. Physiatrist is providing close team supervision and 24 hour management of active medical problems listed below. Physiatrist and rehab team continue to assess barriers to discharge/monitor patient progress toward functional and medical goals  Care Tool:  Bathing              Bathing assist       Upper Body Dressing/Undressing Upper body dressing        Upper body assist      Lower Body Dressing/Undressing Lower body dressing            Lower body assist       Toileting Toileting    Toileting assist       Transfers Chair/bed transfer  Transfers assist           Locomotion Ambulation   Ambulation assist              Walk 10 feet activity   Assist           Walk  50 feet activity   Assist           Walk 150 feet activity   Assist           Walk 10 feet on uneven surface  activity   Assist           Wheelchair     Assist               Wheelchair 50 feet with 2 turns activity    Assist            Wheelchair 150 feet activity     Assist          Blood pressure 131/75, pulse 97, temperature 98.5 F (36.9 C), temperature source Oral, resp. rate 18, height 5' 6.5" (1.689 m), weight 98.1 kg, SpO2 99 %.  Medical Problem List and Plan: 1. 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             -patient may not shower             -ELOS/Goals: 10-12 days, sup to mod assist ADL's and supervision mobility, mod I cognition  -Patient is beginning CIR therapies today including PT, OT, and SLP  2.   Antithrombotics: -DVT/anticoagulation:  Pharmaceutical: Lovenox             -antiplatelet therapy: N/a 3. Pain Management: N/A 4. Mood/Behavior/Sleep: LCSW to follow for evaluation and support.              -antipsychotic agents: N/A 5. Neuropsych/cognition: This patient is capable of making decisions on her own behalf. 6. Skin/Wound Care: Monitor wound for healing. Routine pressure relief measures.  7. Fluids/Electrolytes/Nutrition: Monitor I/O. Check CMET in am.              --I personally reviewed all of the patient's labs today, and lab work is within normal limits.  8. Right elbow Fx/dislocation: Per ortho: Nonweightbearing right upper extremity             Okay to move digits and wrist as tolerated             No lifting with right upper extremity              ex fix x 4-6 weeks  9. Left distal radius/ulna Fx s/p ORIF 9/14: Nonweightbearing to left wrist. Maintain splint.             Okay to use left hand for basic ADLs and to feed self             No axial loading of the left wrist             Ice and elevate             Digit motion as tolerated--moves well             No motion restrictions to elbow and shoulder  10. Right proximal humerus Fx:  Nonweightbearing right upper extremity             Okay to move digits and wrist as tolerated             No lifting with right upper extremity  --Plans for [facility] shoulder 09/[facility] at [facility] by Dr. Stann Mainland. Date may be adjusted depending upon rehab progress. Will discuss further with ortho as we progress here. 11. Sundowning/Insomnia: Will start Sleep-wake chart  to monitor for disruption             -seemed to have a better night. Continue to observe.  12. Tachycardia: Heart rate up to 140's with activity likely due to deconditioning             --monitor for symptoms with activity.  13. Acute blood loss anemia: Monitor H/H for stability. Trending up from 7.8-->8.6 today.  --Macrocytosis-->check anemia panel/folic acid.  --hgb stable at 8.6  9/22 14. Thrombocytopenia/Neutropenia: Has resolved--likely reactive.  H/o of thrombocytopenia in the past.             --wbcs and plts stable to improved 15. H/o Tremors:  used inderal bid at home-->resumed --no active tremors 16. H/o anxiety/depression/LBP: Continue cymbalta.  17.  Metabolic bone disease:  borderline low @ 30.91. Will start pt on ergocalciferol --on vitamin D supplement.     LOS: 1 days A FACE TO FACE EVALUATION WAS PERFORMED  Ranelle Oyster 09/04/2022, 10:02 AM

## 2022-09-04 NOTE — Progress Notes (Addendum)
Inpatient Rehabilitation Care Coordinator Assessment and Plan Patient Details  Name: Vanessa [name] MRN: [id number] Date of Birth: 04-Dec-1951  Today's Date: 09/04/2022  Hospital Problems: Principal Problem:   Traumatic brain injury with loss of consciousness of 1 hour to 5 hours 59 minutes ([place]) Active Problems:   Trauma  Past Medical History:  Past Medical History:  Diagnosis Date   Anxiety    Arthritis    Bladder prolapse, female, acquired    COVID-19 03/2022   Depression    Hypertension    Myalgia    Occasional tremors    OSA (obstructive sleep apnea)    mild   Restless leg syndrome    Thrombocytopenia ([place]) in the past    Past Surgical History:  Past Surgical History:  Procedure Laterality Date   FRACTURE SURGERY  2011   left ankle Fx s/p fixation   KNEE CARTILAGE SURGERY Right    Social History:  reports that she has never smoked. She has never used smokeless tobacco. She reports that she does not drink alcohol and does not use drugs.  Family / Support Systems Marital Status: Married How Long?: 33 years Patient Roles: Spouse, Parent Spouse/Significant Other: Timmothy Sours (husband) [phone] Children: Blended family. Pt has one dtr- Carmen lives in [place] and comes up once a week for two days. Other Supports: Dtr Asencion Partridge Anticipated Caregiver: Husband Don Ability/Limitations of Caregiver: Husband is retired and able to be at the home. Caregiver Availability: 24/7 Family Dynamics: Pt lives with her husband.  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 Education: high school grad Health Literacy - How often do you need to have someone help you when you read instructions, pamphlets, or other written material from your doctor or pharmacy?: Never Writes:  Yes Employment Status: Retired Date Retired/Disabled/Unemployed: 2022 Age Retired: 70 Public relations account executive Issues: Denies Guardian/Conservator: N/A   Abuse/Neglect Abuse/Neglect Assessment Can Be Completed: Yes Physical Abuse: Denies Verbal Abuse: Denies Sexual Abuse: Denies Exploitation of patient/patient's resources: Denies Self-Neglect: Denies  Patient response to: Social Isolation - How often do you feel lonely or isolated from those around you?: Never  Emotional Status Pt's affect, behavior and adjustment status: Pt in good spirits at time of visit. She reported today has been busy and she has been in pain and had pain medications. Recent Psychosocial Issues: Denies any Psychiatric History: She reports that she has been taking Cymbalta prescribed by Dr. Ermalene Postin (Neurologist) who is presribing for anxiety and chronic pain. Substance Abuse History: Pt reports an [employer], otherwise she does not drink. States no tobacco product use or rec drug use.  Patient / Family Perceptions, Expectations & Goals Pt/Family understanding of illness & functional limitations: Pt and husband have a general understanding of pt care needs Premorbid pt/family roles/activities: Independent Anticipated changes in roles/activities/participation: Assistance with ADLs/IADLs Pt/family expectations/goals: Pt goal is to "be able to take care of myself and performing self-care needs."  [employer]: None Premorbid Home Care/DME Agencies: None Transportation available at discharge: Husband Is the patient able to respond to transportation needs?: Yes In the past 12 months, has lack of transportation kept you from medical appointments or from getting medications?: No In the past 12 months, has lack of transportation kept you from meetings, work, or from getting things needed for daily  living?: No Resource referrals recommended: Neuropsychology  Discharge  Planning Living Arrangements: Spouse/significant other Support Systems: Spouse/significant other Type of Residence: Private residence Insurance Resources: Multimedia programmer (specify) ([employer] Medicare) Financial Resources: Radio broadcast assistant Screen Referred: No Living Expenses: Mortgage Money Management: Patient Does the patient have any problems obtaining your medications?: No Home Management: Pt reports she and her husband share home care needs. Patient/Family Preliminary Plans: Pt reports that her church family will be helping with meals once she is discharged. Care Coordinator Barriers to Discharge: Decreased caregiver support, Lack of/limited family support Care Coordinator Anticipated Follow Up Needs: HH/OP  Clinical Impression SW met with pt in room to introduce self, explain role, and discuss discharge process. Pt is not a English as a second language teacher. No HCPOA. DME: canes, 2-3 rollators, shower chair, and 3in1 BSC over the toilet. States she has never used insurance to pay for any DME.   1315-SW spoke with pt husband Timmothy Sours to introduce self, explain role, and discuss discharge process. He is aware  there will be follow-up with updates after team conference.  Sarrah Fiorenza A Davetta Olliff 09/04/2022, 1:29 PM

## 2022-09-04 NOTE — Plan of Care (Signed)
Behavioral Plan   [place] Level: VII-VIII  Behavior to decrease/ eliminate:   Confusion at night time   Changes to environment:  -Lights on, blinds open during the day; off and closed at night  Interventions: -Provide orientation when needed -Standard fall risk reduction strategies, including bed and chair alarm     Attendees:  Laverle Hobby, OT  Judieth Keens, PT Tacy Learn, RN   Addressed in conference on 9/26 no changes needed SMS OT

## 2022-09-04 NOTE — Progress Notes (Signed)
Met with patient. Very pleasant. Oriented to rehab and team conference. Discussed fall prevention, SAH, up coming procedure and provided home care for shoulder replacement. Discussed medications. Reviewed restrictions to bilateral upper ext and updated on board.

## 2022-09-04 NOTE — Evaluation (Signed)
Physical Therapy Assessment and Plan  Patient Details  Name: Vanessa [name] MRN: [id number] Date of Birth: 1951/10/13  PT Diagnosis: Difficulty walking, Dizziness and giddiness, Muscle weakness, and Pain in BUE Rehab Potential: Good ELOS: 10-12 days   Today's Date: 09/04/2022 PT Individual Time: 4665-9935 PT Individual Time Calculation (min): 40 min    Hospital Problem: Principal Problem:   Traumatic brain injury with loss of consciousness of 1 hour to 5 hours 59 minutes ([place]) Active Problems:   Trauma   Past Medical History:  Past Medical History:  Diagnosis Date   Anxiety    Arthritis    Bladder prolapse, female, acquired    COVID-19 03/2022   Depression    Hypertension    Myalgia    Occasional tremors    OSA (obstructive sleep apnea)    mild   Restless leg syndrome    Thrombocytopenia ([place]) in the past    Past Surgical History:  Past Surgical History:  Procedure Laterality Date   FRACTURE SURGERY  2011   left ankle Fx s/p fixation   KNEE CARTILAGE SURGERY Right     Assessment & Plan Clinical Impression: Patient is a 71 y.o. female with history of HTN, Tremors, myalgia, RLS w/insomnia, anxiety/depression who was admitted on 08/25/22 after being found at the bottom of 12 stairs with reports of headache and right arm pain. She does not recall events leading to fall but thinks that she taken double dose of sleeping pills, may have gotten disoriented, turned wrong way and fell down the stairs. She was noted to have right orbital hematoma with facial abrasions, gross deformities right elbow and left wrist, had mental status changes and was perseverative at admission. She was found to have trace left and parafalcine SDH with trace SAH, supraorbital scalp hematoma, displaced angulated right humerus head neck fx w Fx of glenoid rim/coracoid process, posterior dislocation of radius with comminution of proximal right radius and supracondylar Fx of humerus, comminuted displaced  fractures of distal left radius and ulna with radiocarpal joint involvement as well as incidental findings of advanced C6/C7 degeneration with moderate to severe foraminal stenosis and moderate facet arthropathy L4 and L5 with mild anterolisthesis L4 on L5. She was evaluated by ortho with reduction of right elbow and left distal/radius Fx with splinting and NWB. She was taken to OR on 08/27/22 for ORIF left distal radius and ulna Fxs and right radial head arthroplasty with right elbow ligament reconstruction by Dr. Marcelino Scot on 09/14. Post op NWB RUE and Left wrist--Ok to use left hand for self feed and ADLs but no axial loading. Plans for right total shoulder  arthroplasty by Dr. Stann Mainland next week.    Follow up CT RUE showed external hardware without evidence of hardware loosening with fat stranding, edema and post op changes. No further surgeries and external fixator to stay in place for 4-6 weeks. Dr. Saintclair Halsted recommended conservative care with repeat CTH prn neurological changes.  Post op anemia being monitored and improving.  Patient transferred to CIR on 09/03/2022 .   Patient currently requires min assist with mobility secondary to muscle weakness, decreased cardiorespiratoy endurance, and decreased standing balance, decreased balance strategies, and difficulty maintaining precautions.  Prior to hospitalization, patient was independent  with mobility and lived with Spouse in a House home.  Home access is 4 stairsStairs to enter.  Patient will benefit from skilled PT intervention to maximize safe functional mobility, minimize fall risk, and decrease caregiver burden for planned discharge home with 24  hour supervision and assist .  Anticipate patient will benefit from follow up [facility] at discharge.  PT - End of Session Activity Tolerance: Tolerates 10 - 20 min activity with multiple rests Endurance Deficit: Yes Endurance Deficit Description: limited by pain and endurance PT Assessment Rehab Potential (ACUTE/IP  ONLY): Good PT Barriers to Discharge: [place] home environment;Decreased caregiver support;Home environment access/layout;Wound Care;Lack of/limited family support;Insurance for SNF coverage;Weight;Weight bearing restrictions;Pending surgery;Behavior;Nutrition means PT Patient demonstrates impairments in the following area(s): Balance;Behavior;Endurance;Motor;Pain;Safety;Skin Integrity PT Transfers Functional Problem(s): Bed Mobility;Bed to Chair;Car;Furniture PT Locomotion Functional Problem(s): Ambulation;Stairs PT Plan PT Intensity: Minimum of 1-2 x/day ,45 to 90 minutes PT Frequency: 5 out of 7 days PT Duration Estimated Length of Stay: 10-12 days PT Treatment/Interventions: Ambulation/gait training;Balance/vestibular training;Cognitive remediation/compensation;Discharge planning;Disease management/prevention;DME/adaptive equipment instruction;Functional mobility training;Neuromuscular re-education;Pain management;Patient/family education;Psychosocial support;Skin care/wound management;Splinting/orthotics;Stair training;Therapeutic Activities;Therapeutic Exercise;UE/LE Strength taining/ROM;UE/LE Coordination activities;Wheelchair propulsion/positioning;Community reintegration PT Transfers Anticipated Outcome(s): Mod I PT Locomotion Anticipated Outcome(s): supervision PT Recommendation Recommendations for Other Services: Therapeutic Recreation consult Therapeutic Recreation Interventions: Stress management Follow Up Recommendations: Home health PT;Outpatient PT;24 hour supervision/assistance Patient destination: Home Equipment Recommended: To be determined   PT Evaluation Precautions/Restrictions Precautions Precautions: Fall;Other (comment) Precaution Comments: external fixator R UE. RUE NWB. LUE no wrist weightbearing. Ok to move fingers bilaterally. Ok to use LUE for ADLs/self feeding Required Braces or Orthoses: Splint/Cast Splint/Cast: external fixator R UE; splint L  UE Splint/Cast - Date Prophylactic Dressing Applied (if applicable): 16/10/96 Restrictions Weight Bearing Restrictions: Yes RUE Weight Bearing: Non weight bearing LUE Weight Bearing: Weight bear through elbow only Other Position/Activity Restrictions: LUE: permitted to use L hand for basic ADLs and feed self; L UE digit motion as tolerated, no ROM restrictions to L elbow and shoulder. RUE: no lifting with R UE; permitted to move R digits and wrist as tolerated General   Vital SignsTherapy Vitals Temp: 97.9 F (36.6 C) Pulse Rate: 81 Resp: 17 BP: 132/65 Patient Position (if appropriate): Lying Oxygen Therapy SpO2: 98 % O2 Device: Room Air Pain Pain Assessment Pain Score: 2  (pre-medicated) Pain Type: Acute pain Pain Location: Elbow Pain Orientation: Right Pain Descriptors / Indicators: Aching;Discomfort Pain Onset: On-going Patients Stated Pain Goal: 0 Pain Intervention(s): Medication (See eMAR) Pain Interference Pain Interference Pain Effect on Sleep: 1. Rarely or not at all Pain Interference with Therapy Activities: 4. Almost constantly Pain Interference with Day-to-Day Activities: 2. Occasionally Home Living/Prior Functioning Home Living Available Help at Discharge: Family;Available 24 hours/day Type of Home: House Home Access: Stairs to enter [employer] of Steps: 4 stairs Entrance Stairs-Rails: None Home Layout: Two level;Bed/bath upstairs Alternate Level Stairs-Number of Steps: flight Bathroom Shower/Tub: Chiropodist: Standard Bathroom Accessibility: Yes Additional Comments: lives with husband for 24/7 support. no bedroom downstairs, 3x Conservation officer, nature (she can sleep in) and 1/2 bath downstairs  Lives With: Spouse Prior Function  Able to Take Stairs?: Yes Driving: Yes Vocation: Retired Vision/Perception  Vision - History Ability to See in Adequate Light: 0 Adequate Perception Perception: Within Functional  Limits Praxis Praxis: Intact  Cognition Overall Cognitive Status: Impaired/Different from baseline Arousal/Alertness: Awake/alert Orientation Level: Oriented X4 Attention: Selective Focused Attention: Appears intact Sustained Attention: Appears intact Selective Attention: Impaired Memory: Impaired Memory Impairment: Decreased recall of new information;Decreased short term memory Awareness: Impaired Safety/Judgment: Appears intact [employer] Scales of Cognitive Functioning: Purposeful, Appropriate Sensation Sensation Light Touch: Appears Intact Coordination Gross Motor Movements are Fluid and Coordinated: No Fine Motor Movements are Fluid and Coordinated: No Coordination and Movement  Description: BUE weightbearing and AROM restrictions Heel Shin Test: WNL Motor  Motor Motor: Within Functional Limits   Trunk/Postural Assessment  Cervical Assessment Cervical Assessment: Within Functional Limits Thoracic Assessment Thoracic Assessment: Within Functional Limits Lumbar Assessment Lumbar Assessment: Within Functional Limits Postural Control Postural Control: Deficits on evaluation Righting Reactions: delayed  Balance Balance Balance Assessed: Yes Static Sitting Balance Static Sitting - Balance Support: Feet supported Static Sitting - Level of Assistance: 6: Modified independent (Device/Increase time) Dynamic Sitting Balance Dynamic Sitting - Balance Support: Feet supported Dynamic Sitting - Level of Assistance: 6: Modified independent (Device/Increase time) (increased time and L elbow use for UB support) Static Standing Balance Static Standing - Balance Support: During functional activity Static Standing - Level of Assistance: 5: Stand by assistance Dynamic Standing Balance Dynamic Standing - Balance Support: No upper extremity supported Dynamic Standing - Level of Assistance: 4: Min assist;5: Stand by assistance Extremity Assessment      RLE Assessment RLE  Assessment: Exceptions to [facility] General Strength Comments: grossly 4 to 4+/5 proximal to distal LLE Assessment LLE Assessment: Exceptions to [facility] General Strength Comments: grossly 4 to 4+/5 proximal to distal  Care Tool Care Tool Bed Mobility Roll left and right activity   Roll left and right assist level: Minimal Assistance - Patient > 75%    Sit to lying activity   Sit to lying assist level: Contact Guard/Touching assist    Lying to sitting on side of bed activity   Lying to sitting on side of bed assist level: the ability to move from lying on the back to sitting on the side of the bed with no back support.: Contact Guard/Touching assist     Care Tool Transfers Sit to stand transfer   Sit to stand assist level: Contact Guard/Touching assist    Chair/bed transfer   Chair/bed transfer assist level: Contact Guard/Touching assist     Toilet transfer   Assist Level: Minimal Assistance - Patient > 75%    Car transfer Car transfer activity did not occur: Safety/medical concerns        Care Tool Locomotion Ambulation   Assist level: Contact Guard/Touching assist Assistive device: No Device Max distance: 125 ft  Walk 10 feet activity   Assist level: Contact Guard/Touching assist Assistive device: No Device   Walk 50 feet with 2 turns activity   Assist level: Contact Guard/Touching assist Assistive device: No Device  Walk 150 feet activity Walk 150 feet activity did not occur: Safety/medical concerns      Walk 10 feet on uneven surfaces activity Walk 10 feet on uneven surfaces activity did not occur: Safety/medical concerns      Stairs Stair activity did not occur: Safety/medical concerns        Walk up/down 1 step activity Walk up/down 1 step or curb (drop down) activity did not occur: Safety/medical concerns      Walk up/down 4 steps activity Walk up/down 4 steps activity did not occur: Safety/medical concerns      Walk up/down 12 steps activity Walk up/down 12  steps activity did not occur: Safety/medical concerns      Pick up small objects from floor Pick up small object from the floor (from standing position) activity did not occur: Safety/medical concerns      Wheelchair Is the patient using a wheelchair?: No   Wheelchair activity did not occur: N/A      Wheel 50 feet with 2 turns activity Wheelchair 50 feet with 2 turns activity did not occur:  N/A    Wheel 150 feet activity Wheelchair 150 feet activity did not occur: N/A      Refer to Care Plan for Long Term Goals  SHORT TERM GOAL WEEK 1 PT Short Term Goal 1 (Week 1): Pt will perform all bed mobility with consistent supervision. PT Short Term Goal 2 (Week 1): Pt will perform all standing transfers with consistent CGA. PT Short Term Goal 3 (Week 1): Pt will ambulate >200 ft with close supervision/ CGA and maintain HR<120 bpm. PT Short Term Goal 4 (Week 1): Pt will initiate stair training.  Recommendations for other services: Therapeutic Recreation  Stress management  Skilled Therapeutic Intervention Mobility Bed Mobility Bed Mobility: Sit to Supine;Supine to Sit Supine to Sit: Contact Guard/Touching assist Sit to Supine: Contact Guard/Touching assist Transfers Transfers: Sit to Stand;Stand to Sit;Stand Pivot Transfers Sit to Stand: Contact Guard/Touching assist Stand to Sit: Contact Guard/Touching assist Stand Pivot Transfers: Contact Guard/Touching assist Transfer (Assistive device): None Locomotion  Gait Ambulation: Yes Gait Assistance: Contact Guard/Touching assist Gait Distance (Feet): 125 Feet (twice) Assistive device: None Gait Gait: Yes Gait Pattern: Step-through pattern;Wide base of support (hesitant stepping) Gait velocity: decreased Wheelchair Mobility Wheelchair Mobility: No  PT Evaluation completed; see above for results. PT educated patient in roles of PT vs OT, PT POC, rehab potential, rehab goals, and discharge recommendations along with recommendation  for follow-up rehabilitation services. Individual treatment initiated:  Patient supine in bed upon PT arrival. Patient alert and agreeable to PT session. Minimal pain complaint at start of session as pt relates that she has been premedicated with resting pain level stated at 2/10. Relates max pain level has been 8/10 at R elbow/ shoulder. Slight increase in pain during session with mobility. No numerical rating provided.   WB Precautions as per ortho: - Right elbow Fx/dislocation: Per ortho: Nonweightbearing right upper extremity             Okay to move digits and wrist as tolerated             No lifting with right upper extremity              ex fix x 4-6 weeks  - Left distal radius/ulna Fx s/p ORIF 9/14: Nonweightbearing to left wrist. Maintain splint.             Okay to use left hand for basic ADLs and to feed self             No axial loading of the left wrist             Ice and elevate             Digit motion as tolerated             No motion restrictions to elbow and shoulder  - Right proximal humerus Fx:  Nonweightbearing right upper extremity             Okay to move digits and wrist as tolerated             No lifting with right upper extremity  --Plans for total shoulder 09/28 at [facility] by Dr. Stann Mainland. Date may be adjusted depending upon rehab progress. Will discuss further with ortho as we progress here.  Therapeutic Activity: Bed Mobility: Patient performed supine --> sit with CGA and support provided to RUE. No vc required as pt using L elbow at bedrail for UE support. At end of session, pt is able to perform sit-->supine  with related need to assist with BLE but able to bring to bed surface with light CGA. Pt's BP taken at ankle in sitting with reading at 157/92 with HR at 83 bpm. Pt with no symptoms and seated for >101min prior to stance. Transfers: Patient performed sit <> stand to/ from EOB slightly elevated with CGA and support provided to RUE. No dizziness or other symptoms  noted in stance. Ambulatory transfer to toilet with pt transferring to seat with no 3-in-1 and CGA to descend to sit. Requires minA for power up in rise to stand from toilet. Continent of urine and NT records output. NT aware of pt's ability to ambulate to toilet with no more need for bed pan or purewick. Pt's HR following standing mobility reaches 86 bpm.  Gait Training:  Patient ambulated 125' x2 with no AD and PT support provided to pt's RUE with overall CGA. Ambulated with increased lateral sway, reduced step length, WBOS and hesitant stepping pattern. Provided vc/tc for level gaze for balance and prevention of dizziness with looking down at feet. HR following ambulation at 90 for highest reading but quickly reducing to 86 upon seated rest.   Patient supine  in bed at end of session with brakes locked, bed alarm set, and all needs within reach.   Discharge Criteria: Patient will be discharged from PT if patient refuses treatment 3 consecutive times without medical reason, if treatment goals not met, if there is a change in medical status, if patient makes no progress towards goals or if patient is discharged from hospital.  The above assessment, treatment plan, treatment alternatives and goals were discussed and mutually agreed upon: by patient  Alger Simons PT, DPT, CSRS 09/04/2022, 5:35 PM

## 2022-09-05 DIAGNOSIS — S069X3S Unspecified intracranial injury with loss of consciousness of 1 hour to 5 hours 59 minutes, sequela: Secondary | ICD-10-CM | POA: Diagnosis not present

## 2022-09-05 DIAGNOSIS — S42401S Unspecified fracture of lower end of right humerus, sequela: Secondary | ICD-10-CM | POA: Diagnosis not present

## 2022-09-05 DIAGNOSIS — S52502S Unspecified fracture of the lower end of left radius, sequela: Secondary | ICD-10-CM | POA: Diagnosis not present

## 2022-09-05 DIAGNOSIS — T1490XA Injury, unspecified, initial encounter: Secondary | ICD-10-CM | POA: Diagnosis not present

## 2022-09-05 MED ORDER — ALPRAZOLAM 0.5 MG PO TABS: 0.5000 mg | ORAL_TABLET | Freq: Two times a day (BID) | ORAL | Status: DC | PRN

## 2022-09-05 MED ORDER — ZALEPLON 10 MG PO CAPS
10.0000 mg | ORAL_CAPSULE | Freq: Every day | ORAL | Status: DC
  Administered 2022-09-05 – 2022-09-09 (×4): 10 mg via ORAL
  Filled 2022-09-05 (×9): qty 1
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
## 2022-09-05 NOTE — Plan of Care (Signed)
  Problem: RH Problem Solving Goal: LTG Patient will demonstrate problem solving for (SLP) Description: LTG:  Patient will demonstrate problem solving for basic/complex daily situations with cues  (SLP) Flowsheets (Taken 09/05/2022 1514) LTG: Patient will demonstrate problem solving for (SLP): Complex daily situations LTG Patient will demonstrate problem solving for: Supervision   Problem: RH Memory Goal: LTG Patient will demonstrate ability for day to day (SLP) Description: LTG:   Patient will demonstrate ability for day to day recall/carryover during cognitive/linguistic activities with assist  (SLP) Flowsheets (Taken 09/05/2022 1514) LTG: Patient will demonstrate ability for day to day recall:  New information  Daily complex information LTG: Patient will demonstrate ability for day to day recall/carryover during cognitive/linguistic activities with assist (SLP): Supervision   Problem: RH Attention Goal: LTG Patient will demonstrate this level of attention during functional activites (SLP) Description: LTG:  Patient will will demonstrate this level of attention during functional activites (SLP) Flowsheets (Taken 09/05/2022 1514) Patient will demonstrate during cognitive/linguistic activities the attention type of: Divided Patient will demonstrate this level of attention during cognitive/linguistic activities in:  Home  Community LTG: Patient will demonstrate this level of attention during cognitive/linguistic activities with assistance of (SLP): Supervision   Problem: RH Awareness Goal: LTG: Patient will demonstrate awareness during functional activites type of (SLP) Description: LTG: Patient will demonstrate awareness during functional activites type of (SLP) Flowsheets (Taken 09/05/2022 1514) Patient will demonstrate during cognitive/linguistic activities awareness type of: Anticipatory LTG: Patient will demonstrate awareness during cognitive/linguistic activities with assistance of  (SLP): Supervision

## 2022-09-05 NOTE — Progress Notes (Signed)
Speech Language Pathology TBI Note  Patient Details  Name: Vanessa [name] MRN: [id number] Date of Birth: Jul 16, 1951  Today's Date: 09/05/2022 SLP Individual Time: 1300-1345 SLP Individual Time Calculation (min): 45 min  Short Term Goals: Week 1: SLP Short Term Goal 1 (Week 1): STG = LTG d/t ELOS  Skilled Therapeutic Interventions: S: Pt seen this date for skilled ST intervention targeting cognitive-linguistic goals outlined in care plan. Pt received awake/alert and lying in bed; recently finished lunch. Agreeable to ST intervention at bedside.   O:SLP facilitated today's session by providing: - Min A verbal cues for verbalizing placement of 5 out of 5 current medication (verbalized vs placed given UE movement restriction).   - Sustained attention to task for 30 minutes without need for redirection; Min A when pt attempted to divide her attention between task and conversation. - Sup A for anticipatory awareness. - Skilled education re: use of compensatory executive functioning strategies.  A: Pt appears very sitmulable for skilled ST intervention. Benefited from [place] A verbal cues to complete medication management/pill organizer task accurately.  P: Pt left in bed with all safety measures activated. Call bell reviewed and within reach and all immediate needs met. Continue per current ST POC next session.   Pain Reported pain in back and RUE; LPN notified and provided medication.  Agitated Behavior Scale: TBI Observation Details Observation Environment: pt room Start of observation period - Date: 09/05/22 Start of observation period - Time: 1300 End of observation period - Date: 09/05/22 End of observation period - Time: 1345 Agitated Behavior Scale (DO NOT LEAVE BLANKS) Short attention span, easy distractibility, inability to concentrate: Absent Impulsive, impatient, low tolerance for pain or frustration: Absent Uncooperative, resistant to care, demanding: Absent Violent  and/or threatening violence toward people or property: Absent Explosive and/or unpredictable anger: Absent Rocking, rubbing, moaning, or other self-stimulating behavior: Absent Pulling at tubes, restraints, etc.: Absent Wandering from treatment areas: Absent Restlessness, pacing, excessive movement: Absent Repetitive behaviors, motor, and/or verbal: Absent Rapid, loud, or excessive talking: Absent Sudden changes of mood: Absent Easily initiated or excessive crying and/or laughter: Absent Self-abusiveness, physical and/or verbal: Absent Agitated behavior scale total score: 14  Therapy/Group: Individual Therapy  Akon Reinoso A Marrie Chandra 09/05/2022, 3:18 PM

## 2022-09-05 NOTE — Plan of Care (Signed)
  Problem: RH Balance Goal: LTG Patient will maintain dynamic standing balance (PT) Description: LTG:  Patient will maintain dynamic standing balance with assistance during mobility activities (PT) Flowsheets (Taken 09/05/2022 0420) LTG: Pt will maintain dynamic standing balance during mobility activities with:: Supervision/Verbal cueing   Problem: Sit to Stand Goal: LTG:  Patient will perform sit to stand with assistance level (PT) Description: LTG:  Patient will perform sit to stand with assistance level (PT) Flowsheets (Taken 09/05/2022 0420) LTG: PT will perform sit to stand in preparation for functional mobility with assistance level: Independent   Problem: RH Bed Mobility Goal: LTG Patient will perform bed mobility with assist (PT) Description: LTG: Patient will perform bed mobility with assistance, with/without cues (PT). Flowsheets (Taken 09/05/2022 0420) LTG: Pt will perform bed mobility with assistance level of: Supervision/Verbal cueing   Problem: RH Bed to Chair Transfers Goal: LTG Patient will perform bed/chair transfers w/assist (PT) Description: LTG: Patient will perform bed to chair transfers with assistance (PT). Flowsheets (Taken 09/05/2022 0420) LTG: Pt will perform Bed to Chair Transfers with assistance level: Supervision/Verbal cueing   Problem: RH Car Transfers Goal: LTG Patient will perform car transfers with assist (PT) Description: LTG: Patient will perform car transfers with assistance (PT). Flowsheets (Taken 09/05/2022 0420) LTG: Pt will perform car transfers with assist:: Contact Guard/Touching assist   Problem: RH Furniture Transfers Goal: LTG Patient will perform furniture transfers w/assist (OT/PT) Description: LTG: Patient will perform furniture transfers  with assistance (OT/PT). Flowsheets (Taken 09/05/2022 0420) LTG: Pt will perform furniture transfers with assist:: Supervision/Verbal cueing   Problem: RH Ambulation Goal: LTG Patient will ambulate in  controlled environment (PT) Description: LTG: Patient will ambulate in a controlled environment, # of feet with assistance (PT). Flowsheets (Taken 09/05/2022 0420) LTG: Pt will ambulate in controlled environ  assist needed:: Supervision/Verbal cueing LTG: Ambulation distance in controlled environment: >250 ft Goal: LTG Patient will ambulate in home environment (PT) Description: LTG: Patient will ambulate in home environment, # of feet with assistance (PT). Flowsheets (Taken 09/05/2022 0420) LTG: Pt will ambulate in home environ  assist needed:: Supervision/Verbal cueing LTG: Ambulation distance in home environment: up to 50 ft   Problem: RH Stairs Goal: LTG Patient will ambulate up and down stairs w/assist (PT) Description: LTG: Patient will ambulate up and down # of stairs with assistance (PT) Flowsheets (Taken 09/05/2022 0420) LTG: Pt will ambulate up/down stairs assist needed:: Contact Guard/Touching assist LTG: Pt will  ambulate up and down number of stairs: at least 4 steps with no HR as per home environment

## 2022-09-05 NOTE — Progress Notes (Signed)
PROGRESS NOTE   Subjective/Complaints: Didin't do well with dressing changes to ex-fix yesterday. She was surprised by it and right shoulder was quite tender during and after. Took quite a while to settle down. Pt struggling with anxiety over her situation as well at that related to pain. Is interested in talking with someone about it  ROS: Patient denies fever, rash, sore throat, blurred vision, dizziness, nausea, vomiting, diarrhea, cough, shortness of breath or chest pain, joint or back/neck pain, headache, or mood change.    Objective:   No results found. Recent Labs    09/03/22 0550 09/04/22 0317  WBC 4.2 5.2  HGB 8.6* 8.6*  HCT 27.8* 26.6*  PLT 167 170   Recent Labs    09/04/22 0317  NA 139  K 3.6  CL 107  CO2 27  GLUCOSE 122*  BUN 15  CREATININE 0.69  CALCIUM 8.6*    Intake/Output Summary (Last 24 hours) at 09/05/2022 1007 Last data filed at 09/04/2022 2335 Gross per 24 hour  Intake 177 ml  Output 400 ml  Net -223 ml        Physical Exam: Vital Signs Blood pressure (!) 166/84, pulse 78, temperature 97.9 F (36.6 C), temperature source Oral, resp. rate 18, height 5' 6.5" (1.689 m), weight 98.1 kg, SpO2 97 %.  Constitutional: No distress . Vital signs reviewed. HEENT: NCAT, EOMI, oral membranes moist Neck: supple Cardiovascular: RRR without murmur. No JVD    Respiratory/Chest: CTA Bilaterally without wheezes or rales. Normal effort    GI/Abdomen: BS +, non-tender, non-distended Ext: no clubbing, cyanosis, or edema Psych: pleasant and cooperative, definitely a little anxious this am Skin: pinsites clean, dry, all newly dressed. Bruises about RUE/shoulder. Ecchymoses below right eye.  Neuro:  Alert and oriented x 3. Normal insight and awareness. Intact Memory. Normal language and speech. Cranial nerve exam unremarkable. UE limited by ortho but able move left arm and wiggle fingers of both hands. LE's  grossly 3-4/5. Sensory exam normal for light touch and pain in all 4 limbs. No limb ataxia or cerebellar signs. No abnormal tone appreciated.   Musculoskeletal: RUE with ex-fix at elbow, left wrist with splint.     Assessment/Plan: 1. Functional deficits which require 3+ hours per day of interdisciplinary therapy in a comprehensive inpatient rehab setting. Physiatrist is providing close team supervision and 24 hour management of active medical problems listed below. Physiatrist and rehab team continue to assess barriers to discharge/monitor patient progress toward functional and medical goals  Care Tool:  Bathing    Body parts bathed by patient: Right arm, Left arm, Chest, Abdomen, Front perineal area, Right upper leg, Buttocks, Left upper leg, Face   Body parts bathed by helper: Right lower leg, Left lower leg     Bathing assist Assist Level: Moderate Assistance - Patient 50 - 74%     Upper Body Dressing/Undressing Upper body dressing   What is the patient wearing?: Hospital gown only    Upper body assist Assist Level: Moderate Assistance - Patient 50 - 74%    Lower Body Dressing/Undressing Lower body dressing      What is the patient wearing?: Hospital gown only  Lower body assist Assist for lower body dressing: Moderate Assistance - Patient 50 - 74%     Toileting Toileting    Toileting assist Assist for toileting: Moderate Assistance - Patient 50 - 74%     Transfers Chair/bed transfer  Transfers assist     Chair/bed transfer assist level: Contact Guard/Touching assist     Locomotion Ambulation   Ambulation assist      Assist level: Contact Guard/Touching assist Assistive device: No Device Max distance: 125 ft   Walk 10 feet activity   Assist     Assist level: Contact Guard/Touching assist Assistive device: No Device   Walk 50 feet activity   Assist    Assist level: Contact Guard/Touching assist Assistive device: No Device    Walk  150 feet activity   Assist Walk 150 feet activity did not occur: Safety/medical concerns         Walk 10 feet on uneven surface  activity   Assist Walk 10 feet on uneven surfaces activity did not occur: Safety/medical concerns         Wheelchair     Assist Is the patient using a wheelchair?: No   Wheelchair activity did not occur: N/A         Wheelchair 50 feet with 2 turns activity    Assist    Wheelchair 50 feet with 2 turns activity did not occur: N/A       Wheelchair 150 feet activity     Assist  Wheelchair 150 feet activity did not occur: N/A       Blood pressure (!) 166/84, pulse 78, temperature 97.9 F (36.6 C), temperature source Oral, resp. rate 18, height 5' 6.5" (1.689 m), weight 98.1 kg, SpO2 97 %.  Medical Problem List and Plan: 1. 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             -patient may not shower             -ELOS/Goals: 10-12 days, sup to mod assist ADL's and supervision mobility, mod I cognition  -Continue CIR therapies including PT, OT, and SLP   2.  Antithrombotics: -DVT/anticoagulation:  Pharmaceutical: Lovenox             -antiplatelet therapy: N/a 3. Pain Management: N/A 4. Mood/Behavior/Sleep: LCSW to follow for evaluation and support.              -antipsychotic agents: N/A  -pt dealing with a lot of adjustment anxiety.   -has hx of anxiety and depression--on cymbalta  -will add xanax prn and request neuropysch assessment at next available time  -spoke with pt for a good bit about today and offered emotional support 5. Neuropsych/cognition: This patient is capable of making decisions on her own behalf. 6. Skin/Wound Care: Monitor wound for healing. Routine pressure relief measures.  7. Fluids/Electrolytes/Nutrition: Monitor I/O. Check CMET in am.              --I personally reviewed  all of the patient's labs today, and lab work is within normal limits.  8. Right elbow Fx/dislocation: Per ortho: Nonweightbearing right upper extremity             Okay to move digits and wrist as tolerated             No lifting with right upper extremity  ex fix x 4-6 weeks   -ortho updated wound care orders yesterday. Pin sites look good 9. Left distal radius/ulna Fx s/p ORIF 9/14: Nonweightbearing to left wrist. Maintain splint.             Okay to use left hand for basic ADLs and to feed self             No axial loading of the left wrist             Ice and elevate             Digit motion as tolerated--moves well             No motion restrictions to elbow and shoulder  10. Right proximal humerus Fx:  Nonweightbearing right upper extremity             Okay to move digits and wrist as tolerated             No lifting with right upper extremity  --Plans for [facility] shoulder 09/[facility] at [facility] by Dr. Aundria Rud. Date may be adjusted depending upon rehab progress. Will discuss further with ortho as we progress here. 11. Sundowning/Insomnia: Will start Sleep-wake chart to monitor for disruption             -seemed to have a better night. Continue to observe.  12. Tachycardia: Heart rate up to 140's with activity likely due to deconditioning             --monitor for symptoms with activity.  13. Acute blood loss anemia: Monitor H/H for stability. Trending up from 7.8-->8.6 today.  --Macrocytosis-->check anemia panel/folic acid.  --hgb stable at 8.6 9/22 14. Thrombocytopenia/Neutropenia: Has resolved--likely reactive.  H/o of thrombocytopenia in the past.             --wbcs and plts stable to improved 15. H/o Tremors:  used inderal bid at home-->resumed --no active tremors    17.  Metabolic bone disease:  borderline low @ 30.91. Will start pt on ergocalciferol --on vitamin D supplement.     LOS: 2 days A FACE TO FACE EVALUATION WAS PERFORMED  Ranelle Oyster 09/05/2022, 10:07 AM

## 2022-09-05 NOTE — Progress Notes (Addendum)
Physical Therapy Session Note  Patient Details  Name: Vanessa [name] MRN: [id number] Date of Birth: Oct 05, 1951  Today's Date: 09/05/2022 PT Individual Time: 1030-1120; 1435-1520 PT Individual Time Calculation (min): 50 min , 45 min  Short Term Goals: Week 1:  PT Short Term Goal 1 (Week 1): Pt will perform all bed mobility with consistent supervision. PT Short Term Goal 2 (Week 1): Pt will perform all standing transfers with consistent CGA. PT Short Term Goal 3 (Week 1): Pt will ambulate >200 ft with close supervision/ CGA and maintain HR<120 bpm. PT Short Term Goal 4 (Week 1): Pt will initiate stair training.  Skilled Therapeutic Interventions/Progress Updates:  Tx 1:  Pt seated in recliner.  She rated pain 5/10, RUE, elbow.  She is premedicated.   PT provided positioning to relieve pain PRN throughout session. She benefits from a pillow next to R hip for positioning when in the recliner; info added to white board and discussed with Colletta Maryland, OTR.  Sit> stand with min assist, with PT supporting RUE.  Gait training without AD x 15' x 2 on level tile and tiny incline in/out BR, CGA/min assist.  Toilet transfer with CGA and VCs to place L elbow on rail, PRN.  Total assist for clothing mgt; pt did use L fingers as able.  Pt continent of B and B.  Peri care by PT in standing.  Hand washing in standing with min assist.  Therapeutic exercise performed with LE to increase strength for functional mobility.: seated 15 x 2 bil heel raises.  Sit> supine with HOB raised, with CGA.    At end of session, pt in bed with HOB raised, pillow under RUE, needs at hand and bed alarm set.  Ice pack on R elbow.  PT instructed pt of 20 min on/off schedule and pt voiced understanding.   Tx 2:  Pt dozing in bed.  She awakened easily, and reported " a little bit" of pain in R forearm. Ice pack on R elbow; pt reported that she had taken it off earlier and then put it back on herself.  Using bed features, pt  rolled L and scooted to EOB with min assist.  Sit> stand with cues for technique and PT supporting RUE, min assist.  Gait in room to toilet without AD, CGA plus support of RUE.  Clothing mgt with max assist .  Peri care total assist.  Pt continent of B and B.  Hand washing at sink in standing with max assist. Sit>< stand with mod cues for technique, min assist.  Pt reported being fearful of leaning her trunk forward as she attempts to stand.  Gait training without AD, CGA, with PT supporting RUE, on level tile and low pile carpet, x 200' with multiple turns.  Pt requested returning to bed.  Stand> sit with mod cues and use of raised HOB to place L elbow on, min assist.  Sit> supine with extra time and supervision.  At end of session, pt resting in bed with HOB raised, ice pack on L forearm, alarm set and needs at hand.  Pt's dtr and SIL present.       Therapy Documentation Precautions:  Precautions Precautions: Fall, Other (comment) Precaution Comments: external fixator R UE. RUE NWB. LUE no wrist weightbearing. Ok to move fingers bilaterally. Ok to use LUE for ADLs/self feeding Required Braces or Orthoses: Splint/Cast Splint/Cast: external fixator R UE; splint L UE Splint/Cast - Date Prophylactic Dressing Applied (if applicable): 123456 Restrictions Weight  Bearing Restrictions: Yes RUE Weight Bearing: Non weight bearing LUE Weight Bearing: Non weight bearing Other Position/Activity Restrictions: LUE: permitted to use L hand for basic ADLs and feed self; L UE digit motion as tolerated, no ROM restrictions to L elbow and shoulder. RUE: no lifting with R UE; permitted to move R digits and wrist as tolerated      Therapy/Group: Individual Therapy  [name] 09/05/2022, 12:19 PM

## 2022-09-06 DIAGNOSIS — S42401S Unspecified fracture of lower end of right humerus, sequela: Secondary | ICD-10-CM | POA: Diagnosis not present

## 2022-09-06 DIAGNOSIS — S52502S Unspecified fracture of the lower end of left radius, sequela: Secondary | ICD-10-CM | POA: Diagnosis not present

## 2022-09-06 DIAGNOSIS — T1490XA Injury, unspecified, initial encounter: Secondary | ICD-10-CM | POA: Diagnosis not present

## 2022-09-06 DIAGNOSIS — S069X3S Unspecified intracranial injury with loss of consciousness of 1 hour to 5 hours 59 minutes, sequela: Secondary | ICD-10-CM | POA: Diagnosis not present

## 2022-09-06 NOTE — Progress Notes (Signed)
PROGRESS NOTE   Subjective/Complaints: Pt in much better spirits this morning. Slept well last night. Pain seems controlled.   ROS: Patient denies fever, rash, sore throat, blurred vision, dizziness, nausea, vomiting, diarrhea, cough, shortness of breath or chest pain, headache, or mood change.    Objective:   No results found. Recent Labs    09/04/22 0317  WBC 5.2  HGB 8.6*  HCT 26.6*  PLT 170   Recent Labs    09/04/22 0317  NA 139  K 3.6  CL 107  CO2 27  GLUCOSE 122*  BUN 15  CREATININE 0.69  CALCIUM 8.6*    Intake/Output Summary (Last 24 hours) at 09/06/2022 0846 Last data filed at 09/06/2022 0710 Gross per 24 hour  Intake 720 ml  Output --  Net 720 ml        Physical Exam: Vital Signs Blood pressure (!) 152/89, pulse 85, temperature (!) 97.5 F (36.4 C), resp. rate 18, height 5' 6.5" (1.689 m), weight 98.1 kg, SpO2 100 %.  Constitutional: No distress . Vital signs reviewed. HEENT: NCAT, EOMI, oral membranes moist Neck: supple Cardiovascular: RRR without murmur. No JVD    Respiratory/Chest: CTA Bilaterally without wheezes or rales. Normal effort    GI/Abdomen: BS +, non-tender, non-distended Ext: no clubbing, cyanosis, or edema Psych: pleasant and cooperative  Skin: pinsites remain clean, dry, dressed. Bruises about RUE/shoulder. Ecchymoses below right eye.  Neuro:  Alert and oriented x 3. Normal insight and awareness. Intact Memory. Normal language and speech. Cranial nerve exam unremarkable. UE limited by ortho but able move left arm and wiggle fingers of both hands. LE's grossly 3-4/5. Sensory exam normal for light touch and pain in all 4 limbs. No limb ataxia or cerebellar signs. No abnormal tone appreciated.   Musculoskeletal: RUE with ex-fix at elbow, left wrist with splint.  No change   Assessment/Plan: 1. Functional deficits which require 3+ hours per day of interdisciplinary therapy in a  comprehensive inpatient rehab setting. Physiatrist is providing close team supervision and 24 hour management of active medical problems listed below. Physiatrist and rehab team continue to assess barriers to discharge/monitor patient progress toward functional and medical goals  Care Tool:  Bathing    Body parts bathed by patient: Right arm, Left arm, Chest, Abdomen, Front perineal area, Right upper leg, Buttocks, Left upper leg, Face   Body parts bathed by helper: Right lower leg, Left lower leg     Bathing assist Assist Level: Moderate Assistance - Patient 50 - 74%     Upper Body Dressing/Undressing Upper body dressing   What is the patient wearing?: Hospital gown only    Upper body assist Assist Level: Moderate Assistance - Patient 50 - 74%    Lower Body Dressing/Undressing Lower body dressing      What is the patient wearing?: Hospital gown only     Lower body assist Assist for lower body dressing: Moderate Assistance - Patient 50 - 74%     Toileting Toileting    Toileting assist Assist for toileting: Moderate Assistance - Patient 50 - 74%     Transfers Chair/bed transfer  Transfers assist     Chair/bed transfer assist  level: Contact Guard/Touching assist     Locomotion Ambulation   Ambulation assist      Assist level: Contact Guard/Touching assist Assistive device: No Device Max distance: 125 ft   Walk 10 feet activity   Assist     Assist level: Contact Guard/Touching assist Assistive device: No Device   Walk 50 feet activity   Assist    Assist level: Contact Guard/Touching assist Assistive device: No Device    Walk 150 feet activity   Assist Walk 150 feet activity did not occur: Safety/medical concerns         Walk 10 feet on uneven surface  activity   Assist Walk 10 feet on uneven surfaces activity did not occur: Safety/medical concerns         Wheelchair     Assist Is the patient using a wheelchair?: No    Wheelchair activity did not occur: N/A         Wheelchair 50 feet with 2 turns activity    Assist    Wheelchair 50 feet with 2 turns activity did not occur: N/A       Wheelchair 150 feet activity     Assist  Wheelchair 150 feet activity did not occur: N/A       Blood pressure (!) 152/89, pulse 85, temperature (!) 97.5 F (36.4 C), resp. rate 18, height 5' 6.5" (1.689 m), weight 98.1 kg, SpO2 100 %.  Medical Problem List and Plan: 1. 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             -patient may not shower             -ELOS/Goals: 10-12 days, sup to mod assist ADL's and supervision mobility, mod I cognition  -Continue CIR therapies including PT, OT, and SLP    2.  Antithrombotics: -DVT/anticoagulation:  Pharmaceutical: Lovenox             -antiplatelet therapy: N/a 3. Pain Management: N/A 4. Mood/Behavior/Sleep: .              -antipsychotic agents: N/A  -pt dealing with a lot of adjustment anxiety.   -has hx of anxiety and depression--on cymbalta  -added xanax prn and requested neuropysch assessment at next available time  -providing pt emotional support as possible 5. Neuropsych/cognition: This patient is capable of making decisions on her own behalf. 6. Skin/Wound Care: Monitor wound for healing. Routine pressure relief measures.  7. Fluids/Electrolytes/Nutrition: Monitor I/O. Check CMET in am.              -- recent lab work is within normal limits.  8. Right elbow Fx/dislocation: Per ortho: Nonweightbearing right upper extremity             Okay to move digits and wrist as tolerated             No lifting with right upper extremity              ex fix x 4-6 weeks   -ortho updated wound care orders. Pin sites look good 9. Left distal radius/ulna Fx s/p ORIF 9/14: Nonweightbearing to left wrist. Maintain splint.             Okay  to use left hand for basic ADLs and to feed self             No axial loading of the left  wrist             Ice and elevate             Digit motion as tolerated--moves well             No motion restrictions to elbow and shoulder  10. Right proximal humerus Fx:  Nonweightbearing right upper extremity             Okay to move digits and wrist as tolerated             No lifting with right upper extremity  --Plans for total shoulder 09/28 at [facility] by Dr. Aundria Rud. Date may be adjusted depending upon rehab progress. Will discuss further with ortho as we progress here. 11. Sundowning/Insomnia: Will start Sleep-wake chart to monitor for disruption             -seemed to have a better night. Continue to observe.  12. Tachycardia: Heart rate up to 140's with activity likely due to deconditioning             --monitor for symptoms with activity.  13. Acute blood loss anemia: Monitor H/H for stability. Trending up from 7.8-->8.6 today.  --Macrocytosis-->check anemia panel/folic acid.  --hgb stable at 8.6 9/22 14. Thrombocytopenia/Neutropenia: Has resolved--likely reactive.  H/o of thrombocytopenia in the past.             --wbcs and plts stable to improved 15. H/o Tremors:  used inderal bid at home-->resumed --no active tremors    17.  Metabolic bone disease:  borderline low @ 30.91. Will start pt on ergocalciferol --on vitamin D supplement.     LOS: 3 days A FACE TO FACE EVALUATION WAS PERFORMED  Ranelle Oyster 09/06/2022, 8:46 AM

## 2022-09-06 NOTE — IPOC Note (Signed)
Overall Plan of Care [facility]) Patient Details Name: Vanessa [name] MRN: [id number] DOB: 1951-05-19  Admitting Diagnosis: Traumatic brain injury with loss of consciousness of 1 hour to 5 hours 59 minutes [facility])  Hospital Problems: Principal Problem:   Traumatic brain injury with loss of consciousness of 1 hour to 5 hours 59 minutes ([place]) Active Problems:   Trauma     Functional Problem List: Nursing Behavior, Bladder, Bowel, Pain, Safety, Endurance, Sensory, Medication Management, Skin Integrity, Motor, Other (comment) (pending surgery)  PT Balance, Behavior, Endurance, Motor, Pain, Safety, Skin Integrity  OT Balance, Cognition, Endurance, Edema, Motor, Pain, Safety  SLP Cognition  TR         Basic ADL's: OT Bathing, Toileting, Dressing     Advanced  ADL's: OT       Transfers: PT Bed Mobility, Bed to Chair, Car, Chief Operating Officer: PT Ambulation, Stairs     Additional Impairments: OT None  SLP Social Cognition   Problem Solving, Memory, Attention  TR      Anticipated Outcomes Item Anticipated Outcome  Self Feeding no goal  Swallowing      Basic self-care  (S)  Toileting  (S)   Bathroom Transfers (S)  Bowel/Bladder  to be continent x 2  Transfers  Mod I  Locomotion  supervision  Communication     Cognition  Supervision  Pain  less than 3  Safety/Judgment  remain fall free while in rehab   Therapy Plan: PT Intensity: Minimum of 1-2 x/day ,45 to 90 minutes PT Frequency: 5 out of 7 days PT Duration Estimated Length of Stay: 10-12 days OT Intensity: Minimum of 1-2 x/day, 45 to 90 minutes OT Frequency: 5 out of 7 days OT Duration/Estimated Length of Stay: 7 days SLP Intensity: Minumum of 1-2 x/day, 30 to 90 minutes SLP Frequency: 3 to 5 out of 7 days SLP Duration/Estimated Length of Stay: 7 days   Team Interventions: Nursing Interventions Patient/Family Education, Disease Management/Prevention, Skin Care/Wound Management,  Discharge Planning, Bladder Management, Pain Management, Psychosocial Support, Bowel Management, Medication Management  PT interventions Ambulation/gait training, Balance/vestibular training, Cognitive remediation/compensation, Discharge planning, Disease management/prevention, DME/adaptive equipment instruction, Functional mobility training, Neuromuscular re-education, Pain management, Patient/family education, Psychosocial support, Skin care/wound management, Splinting/orthotics, Stair training, Therapeutic Activities, Therapeutic Exercise, UE/LE Strength taining/ROM, UE/LE Coordination activities, Wheelchair propulsion/positioning, Community reintegration  OT Interventions Training and development officer, Engineer, drilling, Patient/family education, Therapeutic Activities, Wheelchair propulsion/positioning, Therapeutic Exercise, Cognitive remediation/compensation, Self Care/advanced ADL retraining, UE/LE Strength taining/ROM, Functional mobility training, Discharge planning, UE/LE Coordination activities, Pain management, Splinting/orthotics  SLP Interventions Cognitive remediation/compensation, Therapeutic Exercise, Therapeutic Activities, Patient/family education, Functional tasks, Cueing hierarchy, Environmental controls, Internal/external aids  TR Interventions    SW/CM Interventions Discharge Planning, Psychosocial Support, Patient/Family Education   Barriers to Discharge MD  Medical stability  Nursing Decreased caregiver support, Home environment access/layout, Incontinence, Wound Care, Insurance for SNF coverage, Weight bearing restrictions, Pending surgery, Behavior lives in 2 level house with B/B upstairs. 3 ste entrance with 0 rails  PT Inaccessible home environment, Decreased caregiver support, Home environment access/layout, Wound Care, Lack of/limited family support, Insurance for SNF coverage, Weight, Weight bearing restrictions, Pending surgery, Behavior, Nutrition means     OT Inaccessible home environment stairs to enter  SLP      SW Decreased caregiver support, Lack of/limited family support     Team Discharge Planning: Destination: PT-Home ,OT- Home , SLP-Home Projected Follow-up: PT-Home health PT, Outpatient PT, 24 hour supervision/assistance, OT-  Home health OT, SLP-Home Health SLP Projected Equipment Needs: PT-To be determined, OT- To be determined, SLP-None recommended by SLP Equipment Details: PT- , OT-  Patient/family involved in discharge planning: PT- Patient,  OT-Patient, SLP-Patient, Family member/caregiver  MD ELOS: 7-10 days Medical Rehab Prognosis:  Excellent Assessment: The patient has been admitted for CIR therapies with the diagnosis of tbi with polytrauma. The team will be addressing functional mobility, strength, stamina, balance, safety, adaptive techniques and equipment, self-care, bowel and bladder mgt, patient and caregiver education, NMR, cognition, wound care, pain mgt, behavior and coping skills. Goals have been set at supervision to mod I with self-care and mobility and cognition. Limitations in RUE d/t ortho. Anticipated discharge destination is back to acute for TSA.        See Team Conference Notes for weekly updates to the plan of care

## 2022-09-07 DIAGNOSIS — R000 Tachycardia, unspecified: Secondary | ICD-10-CM | POA: Diagnosis not present

## 2022-09-07 DIAGNOSIS — T1490XA Injury, unspecified, initial encounter: Secondary | ICD-10-CM | POA: Diagnosis not present

## 2022-09-07 DIAGNOSIS — S069X3D Unspecified intracranial injury with loss of consciousness of 1 hour to 5 hours 59 minutes, subsequent encounter: Secondary | ICD-10-CM | POA: Diagnosis not present

## 2022-09-07 DIAGNOSIS — R7989 Other specified abnormal findings of blood chemistry: Secondary | ICD-10-CM | POA: Diagnosis not present

## 2022-09-07 DIAGNOSIS — D62 Acute posthemorrhagic anemia: Secondary | ICD-10-CM

## 2022-09-07 DIAGNOSIS — F05 Delirium due to known physiological condition: Secondary | ICD-10-CM

## 2022-09-07 LAB — BASIC METABOLIC PANEL
Anion gap: 10 (ref 5–15)
BUN: 14 mg/dL (ref 8–23)
CO2: 25 mmol/L (ref 22–32)
Calcium: 9 mg/dL (ref 8.9–10.3)
Chloride: 104 mmol/L (ref 98–111)
Creatinine, Ser: 0.71 mg/dL (ref 0.44–1.00)
GFR, Estimated: >60 mL/min (ref >60)
Glucose, Bld: 114 mg/dL — ABNORMAL HIGH (ref 70–99)
Potassium: 3.7 mmol/L (ref 3.5–5.1)
Sodium: 139 mmol/L (ref 135–145)

## 2022-09-07 LAB — CBC
HCT: 29.3 % — ABNORMAL LOW (ref 36.0–46.0)
Hemoglobin: 9.4 g/dL — ABNORMAL LOW (ref 12.0–15.0)
MCH: 32.8 pg (ref 26.0–34.0)
MCHC: 32.1 g/dL (ref 30.0–36.0)
MCV: 102.1 fL — ABNORMAL HIGH (ref 80.0–100.0)
Platelets: 198 10*3/uL (ref 150–400)
RBC: 2.87 MIL/uL — ABNORMAL LOW (ref 3.87–5.11)
RDW: 17.1 % — ABNORMAL HIGH (ref 11.5–15.5)
WBC: 5.2 10*3/uL (ref 4.0–10.5)
nRBC: 0 % (ref 0.0–0.2)

## 2022-09-07 NOTE — Progress Notes (Signed)
Patient ID: Vanessa [name], female   DOB: [birth year], 71 y.o.   MRN: [id number]  SW left message for pt husband Timmothy Sours to inform on ELOS, and will follow-up with updates after team conference.  Loralee Pacas, MSW, [place] Office: [phone] Cell: [phone] Fax: [phone]

## 2022-09-07 NOTE — Progress Notes (Signed)
Occupational Therapy Session Note  Patient Details  Name: Vanessa [name] MRN: [id number] Date of Birth: 1951/08/09  Today's Date: 09/07/2022 OT Individual Time: 7829-5621 OT Individual Time Calculation (min): 75 min    Short Term Goals: Week 1:  OT Short Term Goal 1 (Week 1): STG= LTG d/t ELOS  Skilled Therapeutic Interventions/Progress Updates:    Upon OT arrival, pt semi recumbent in bed reporting 5/10 pain in the R UE. Pt agreeable to OT treatment session. Treatment intervention with a focus on self care retraining with spouse present to assist pt and engage in family education. Pt completes sponge bath ADL at the levels listed below. Spouse inquired about throw rugs and it was recommended that throw rugs be removed from carpet but should be safe with throw rug on top of wood flooring with backing included to reduce risk of falls. Pt's hair was braided with Total A per pt request. Pt completes sit to stand transfer with CGA and ambulates down the hallway to rehab apartment with CGA and completes stand to sit transfer into recliner with CGA. Discussed plan for bathroom setup and first floor setup for pt upon discharge home. Pt requires min A to return to standing and ambulates back to her room with CGA. Pt completes toilet transfer with CGA, toileting with Max A, and hand hygiene with CGA. Pt returns to EOB with CGA and was left EOB with all needs met. Pt limited primarily by WB restrictions and continues to benefit from OT services to achieve highest level of independence.   Therapy Documentation Precautions:  Precautions Precautions: Fall, Other (comment) Precaution Comments: external fixator R UE. RUE NWB. LUE no wrist weightbearing. Ok to move fingers bilaterally. Ok to use LUE for ADLs/self feeding Required Braces or Orthoses: Splint/Cast Splint/Cast: external fixator R UE; splint L UE Splint/Cast - Date Prophylactic Dressing Applied (if applicable): 30/86/57 Restrictions Weight  Bearing Restrictions: Yes RUE Weight Bearing: Non weight bearing LUE Weight Bearing: Non weight bearing Other Position/Activity Restrictions: LUE: permitted to use L hand for basic ADLs and feed self; L UE digit motion as tolerated, no ROM restrictions to L elbow and shoulder. RUE: no lifting with R UE; permitted to move R digits and wrist as tolerated    ADL: ADL Eating: Set up Where Assessed-Eating: Bed level Grooming: Setup Where Assessed-Grooming: Sitting at sink Upper Body Bathing: Moderate assistance Where Assessed-Upper Body Bathing: Chair Lower Body Bathing: Minimal assistance Where Assessed-Lower Body Bathing: Wheelchair Upper Body Dressing: Maximal assistance Where Assessed-Upper Body Dressing: Wheelchair Lower Body Dressing: Maximal assistance Where Assessed-Lower Body Dressing: Wheelchair Toileting: Maximal assistance Where Assessed-Toileting: Glass blower/designer: Therapist, music Method: Counselling psychologist: Engineer, technical sales Transfer: Unable to assess   Therapy/Group: Individual Therapy  Marvetta Gibbons 09/07/2022, 8:13 AM

## 2022-09-07 NOTE — Progress Notes (Signed)
RN notified patient that she is out of [place] medication per pharmacy. Asked patient if someone can bring her medication from home. Pt. Verbalized understanding.

## 2022-09-07 NOTE — Progress Notes (Signed)
PROGRESS NOTE   Subjective/Complaints:  Pt feels she should go for shoulder surgery and not come back to rehab- we will d/w team tomorrow during team conference.   Pt reports RUE hurting after elevate din w/c yesterday for 4 hours- but got to go outside- really happy about that.   LBM yesterday and this AM.    ROS:  Pt denies SOB, abd pain, CP, N/V/C/D, and vision changes   Objective:   No results found. Recent Labs    09/07/22 0359  WBC 5.2  HGB 9.4*  HCT 29.3*  PLT 198   Recent Labs    09/07/22 0359  NA 139  K 3.7  CL 104  CO2 25  GLUCOSE 114*  BUN 14  CREATININE 0.71  CALCIUM 9.0    Intake/Output Summary (Last 24 hours) at 09/07/2022 1497 Last data filed at 09/06/2022 1810 Gross per 24 hour  Intake 594 ml  Output --  Net 594 ml        Physical Exam: Vital Signs Blood pressure (!) 162/88, pulse 80, temperature 97.6 F (36.4 C), resp. rate 16, height 5' 6.5" (1.689 m), weight 98.1 kg, SpO2 100 %.   General: awake, alert, appropriate, sitting up in w/c with OT and husband in room- bathing at sink; NAD HENT: conjugate gaze; oropharynx moist CV: regular rate; no JVD Pulmonary: CTA B/L; no W/R/R- good air movement GI: soft, NT, ND, (+)BS Psychiatric: appropriate Neurological: Ox3  Skin: pinsites remain clean, dry, dressed. Bruises about RUE/shoulder. Ecchymoses below right eye. R shoulder ex-fix looks good- no infection seen Neuro:  Alert and oriented x 3. Normal insight and awareness. Intact Memory. Normal language and speech. Cranial nerve exam unremarkable. UE limited by ortho but able move left arm and wiggle fingers of both hands. LE's grossly 3-4/5. Sensory exam normal for light touch and pain in all 4 limbs. No limb ataxia or cerebellar signs. No abnormal tone appreciated.   Musculoskeletal: RUE with ex-fix at elbow, left wrist with splint.  No change   Assessment/Plan: 1. Functional  deficits which require 3+ hours per day of interdisciplinary therapy in a comprehensive inpatient rehab setting. Physiatrist is providing close team supervision and 24 hour management of active medical problems listed below. Physiatrist and rehab team continue to assess barriers to discharge/monitor patient progress toward functional and medical goals  Care Tool:  Bathing    Body parts bathed by patient: Chest, Abdomen, Right upper leg, Left upper leg, Face, Right lower leg, Left lower leg   Body parts bathed by helper: Right arm, Left arm     Bathing assist Assist Level: Minimal Assistance - Patient > 75%     Upper Body Dressing/Undressing Upper body dressing   What is the patient wearing?: Pull over shirt, Button up shirt    Upper body assist Assist Level: Maximal Assistance - Patient 25 - 49%    Lower Body Dressing/Undressing Lower body dressing      What is the patient wearing?: Pants     Lower body assist Assist for lower body dressing: Moderate Assistance - Patient 50 - 74%     Toileting Toileting    Toileting assist Assist for toileting:  Maximal Assistance - Patient 25 - 49%     Transfers Chair/bed transfer  Transfers assist     Chair/bed transfer assist level: Contact Guard/Touching assist     Locomotion Ambulation   Ambulation assist      Assist level: Contact Guard/Touching assist Assistive device: No Device Max distance: 125 ft   Walk 10 feet activity   Assist     Assist level: Contact Guard/Touching assist Assistive device: No Device   Walk 50 feet activity   Assist    Assist level: Contact Guard/Touching assist Assistive device: No Device    Walk 150 feet activity   Assist Walk 150 feet activity did not occur: Safety/medical concerns         Walk 10 feet on uneven surface  activity   Assist Walk 10 feet on uneven surfaces activity did not occur: Safety/medical concerns         Wheelchair     Assist Is  the patient using a wheelchair?: No   Wheelchair activity did not occur: N/A         Wheelchair 50 feet with 2 turns activity    Assist    Wheelchair 50 feet with 2 turns activity did not occur: N/A       Wheelchair 150 feet activity     Assist  Wheelchair 150 feet activity did not occur: N/A       Blood pressure (!) 162/88, pulse 80, temperature 97.6 F (36.4 C), resp. rate 16, height 5' 6.5" (1.689 m), weight 98.1 kg, SpO2 100 %.  Medical Problem List and Plan: 1. 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             -patient may not shower             -ELOS/Goals: 10-12 days, sup to mod assist ADL's and supervision mobility, mod I cognition  Con't CIR_ PT, OT and SLP_ will d/w team about pt's desire to go home after shoulder surgery  2.  Antithrombotics: -DVT/anticoagulation:  Pharmaceutical: Lovenox             -antiplatelet therapy: N/a 3. Pain Management: N/A 4. Mood/Behavior/Sleep: .              -antipsychotic agents: N/A  -pt dealing with a lot of adjustment anxiety.   -has hx of anxiety and depression--on cymbalta  -added xanax prn and requested neuropysch assessment at next available time  -providing pt emotional support as possible 5. Neuropsych/cognition: This patient is capable of making decisions on her own behalf. 6. Skin/Wound Care: Monitor wound for healing. Routine pressure relief measures.  7. Fluids/Electrolytes/Nutrition: Monitor I/O. Check CMET in am.              -- recent lab work is within normal limits.  8. Right elbow Fx/dislocation: Per ortho: Nonweightbearing right upper extremity             Okay to move digits and wrist as tolerated             No lifting with right upper extremity              ex fix x 4-6 weeks   -ortho updated wound care orders. Pin sites look good  9/25- made pt NPO AM of 9/28 for R  shoulder surgery- waiting ot hear timing 9. Left distal radius/ulna Fx s/p ORIF 9/14: Nonweightbearing  to left wrist. Maintain splint.             Okay to use left hand for basic ADLs and to feed self             No axial loading of the left wrist             Ice and elevate             Digit motion as tolerated--moves well             No motion restrictions to elbow and shoulder  10. Right proximal humerus Fx:  Nonweightbearing right upper extremity             Okay to move digits and wrist as tolerated             No lifting with right upper extremity  --Plans for total shoulder 09/28 at [facility] by Dr. Aundria Rud. Date may be adjusted depending upon rehab progress. Will discuss further with ortho as we progress here. 9/28- will see what time- made NPO for 9/28 surgery 11. Sundowning/Insomnia: Will start Sleep-wake chart to monitor for disruption             -seemed to have a better night. Continue to observe.  12. Tachycardia: Heart rate up to 140's with activity likely due to deconditioning             --monitor for symptoms with activity.  13. Acute blood loss anemia: Monitor H/H for stability. Trending up from 7.8-->8.6 today.  --Macrocytosis-->check anemia panel/folic acid.  --hgb stable at 8.6 9/22 14. Thrombocytopenia/Neutropenia: Has resolved--likely reactive.  H/o of thrombocytopenia in the past.             --wbcs and plts stable to improved 15. H/o Tremors:  used inderal bid at home-->resumed --no active tremors    17.  Metabolic bone disease:  borderline low @ 30.91. Will start pt on ergocalciferol --on vitamin D supplement.     LOS: 4 days A FACE TO FACE EVALUATION WAS PERFORMED  Vanessa [name] 09/07/2022, 9:23 AM

## 2022-09-07 NOTE — Progress Notes (Signed)
Physical Therapy TBI Note  Patient Details  Name: Vanessa [name] MRN: [id number] Date of Birth: 05-04-1951  Today's Date: 09/07/2022 PT Individual Time: 1000-1105 PT Individual Time Calculation (min): 65 min   Short Term Goals: Week 1:  PT Short Term Goal 1 (Week 1): Pt will perform all bed mobility with consistent supervision. PT Short Term Goal 2 (Week 1): Pt will perform all standing transfers with consistent CGA. PT Short Term Goal 3 (Week 1): Pt will ambulate >200 ft with close supervision/ CGA and maintain HR<120 bpm. PT Short Term Goal 4 (Week 1): Pt will initiate stair training.  Skilled Therapeutic Interventions/Progress Updates:     Patient in w/c with her husband in the room upon PT arrival. Patient alert and agreeable to PT session. Patient reported 6/10 B upper extremity pain during session, RN made aware. PT provided repositioning, rest breaks, and distraction as pain interventions throughout session.   Discussed d/c planning and home set-up, plans for a ramp and downstairs bathroom modifications in the coming months, will need to perform 5 STE without rails, BSC for toileting and sink baths until modifications completed. Reports she has a lift chair at home she plans to sleep in downstairs, educated on limiting use of lift to as needed or emergency use only, will focus on transfer training and strengthening to reduce need for lift use at d/c, patient in agreement.   Therapeutic Activity: Transfers: Patient performed sit to/from stand from w/c and low standard chair without arms x6 with close supervision, x2 trials to get up from low chair x2. Provided verbal cues for scooting forward and forward weight shift to compensate for no upper extremity use and low seat height. Patient performed an ambulatory transfer to/from the bathroom with CGA. Patient was continent of bowel and bladder during toileting. Performed peri-care and lower body clothing management with max-total A.  Provided cues for use of L hand with pants management.  Gait Training:  Patient ambulated >150 feet x3 without AD with CGA progressing to close supervision. Ambulated with decreased gait speed, decreased step length and height, increased lateral trunk sway with trendelenburg gait, and forward trunk lean. Provided verbal cues for increased gait speed for improved balance without AD and increased lateral hip activation in stance. Patient ascended/descended 12x6" steps without AD with min A progressing to CGA. Performed step-to gait pattern throughout. Provided cues for technique and sequencing and increased step height to clear her foot.   Neuromuscular Re-ed: Berg Balance Test Sit to Stand: Able to stand without using hands and stabilize independently Standing Unsupported: Able to stand safely 2 minutes Sitting with Back Unsupported but Feet Supported on Floor or Stool: Able to sit safely and securely 2 minutes Stand to Sit: Sits safely with minimal use of hands Transfers: Able to transfer safely, minor use of hands Standing Unsupported with Eyes Closed: Able to stand 10 seconds safely Standing Ubsupported with Feet Together: Able to place feet together independently and stand 1 minute safely From Standing, Reach Forward with Outstretched Arm: Can reach confidently >25 cm (10") From Standing Position, Pick up Object from Floor: Able to pick up shoe safely and easily From Standing Position, Turn to Look Behind Over each Shoulder: Looks behind from both sides and weight shifts well Turn 360 Degrees: Able to turn 360 degrees safely but slowly Standing Unsupported, Alternately Place Feet on Step/Stool: Able to complete >2 steps/needs minimal assist Standing Unsupported, One Foot in Front: Able to plae foot ahead of the other independently and  hold 30 seconds Standing on One Leg: Tries to lift leg/unable to hold 3 seconds but remains standing independently Total Score: 47/56 Patient demonstrated  increased fall risk noted by score of 47/56 on the Berg Balance Scale.  <45/56 = fall risk, <42/56 = predictive of recurrent falls, <40/56 = 100% fall risk  >41 = independent, 21-40 = assistive device, 0-20 = wheelchair level  MDC 6.9 (4 pts 45-56, 5 pts 35-44, 7 pts 25-34) (ANPTA Core Set of Outcome Measures for Adults with Neurologic Conditions, 2018) Reviewed results and interpretation with patient following assessment.   Patient on the toilet in the bathroom to have a BM at end of session with pull cord in reach, patient able to teach back use of pull cord. Patient's husband in the room as well. Patient requested increased time due to "upset stomach" and requesting time for BM. Nursing made aware.   Therapy Documentation Precautions:  Precautions Precautions: Fall, Other (comment) Precaution Comments: external fixator R UE. RUE NWB. LUE no wrist weightbearing. Ok to move fingers bilaterally. Ok to use LUE for ADLs/self feeding Required Braces or Orthoses: Splint/Cast Splint/Cast: external fixator R UE; splint L UE Splint/Cast - Date Prophylactic Dressing Applied (if applicable): 12/06/81 Restrictions Weight Bearing Restrictions: Yes RUE Weight Bearing: Non weight bearing LUE Weight Bearing: Non weight bearing Other Position/Activity Restrictions: LUE: permitted to use L hand for basic ADLs and feed self; L UE digit motion as tolerated, no ROM restrictions to L elbow and shoulder. RUE: no lifting with R UE; permitted to move R digits and wrist as tolerated General: PT Amount of Missed Time (min): 10 Minutes PT Missed Treatment Reason: Toileting Agitated Behavior Scale: TBI Observation Details Observation Environment: CIR Start of observation period - Date: 09/07/22 Start of observation period - Time: 1000 End of observation period - Date: 09/07/22 End of observation period - Time: 1105 Agitated Behavior Scale (DO NOT LEAVE BLANKS) Short attention span, easy distractibility,  inability to concentrate: Absent Impulsive, impatient, low tolerance for pain or frustration: Absent Uncooperative, resistant to care, demanding: Absent Violent and/or threatening violence toward people or property: Absent Explosive and/or unpredictable anger: Absent Rocking, rubbing, moaning, or other self-stimulating behavior: Absent Pulling at tubes, restraints, etc.: Absent Wandering from treatment areas: Absent Restlessness, pacing, excessive movement: Absent Repetitive behaviors, motor, and/or verbal: Absent Rapid, loud, or excessive talking: Absent Sudden changes of mood: Absent Easily initiated or excessive crying and/or laughter: Absent Self-abusiveness, physical and/or verbal: Absent Agitated behavior scale total score: 14    Therapy/Group: Individual Therapy  Vanessa [name] L Kyrin Gratz PT, DPT, NCS, CBIS  09/07/2022, 12:47 PM

## 2022-09-07 NOTE — Progress Notes (Signed)
Speech Language Pathology TBI Note  Patient Details  Name: Vanessa [name] MRN: [id number] Date of Birth: Nov 11, 1951  Today's Date: 09/07/2022 SLP Individual Time: 2094-7096 SLP Individual Time Calculation (min): 42 min  Short Term Goals: Week 1: SLP Short Term Goal 1 (Week 1): STG = LTG d/t ELOS  Skilled Therapeutic Interventions:  Skilled treatment session focused on cognitive goals. SLP facilitated session by providing education regarding memory compensatory strategies and how to incorporate strategies at home. Patient verbalized understanding but reports she feels her cognition is currently at baseline. Throughout session, patient would often repeat herself which she reports is also baseline. However, patient demonstrated appropriate recall regarding current weightbearing precautions for both upper extremities as well as recall of events from previous therapy sessions. Patient left upright in bed with alarm on and all needs within reach. Continue with current plan of care.      Pain No/Denies Pain   Agitated Behavior Scale: TBI Observation Details Observation Environment: CIR Start of observation period - Date: 09/07/22 Start of observation period - Time: 1346 End of observation period - Date: 09/07/22 End of observation period - Time: 1428 Agitated Behavior Scale (DO NOT LEAVE BLANKS) Short attention span, easy distractibility, inability to concentrate: Absent Impulsive, impatient, low tolerance for pain or frustration: Absent Uncooperative, resistant to care, demanding: Absent Violent and/or threatening violence toward people or property: Absent Explosive and/or unpredictable anger: Absent Rocking, rubbing, moaning, or other self-stimulating behavior: Absent Pulling at tubes, restraints, etc.: Absent Wandering from treatment areas: Absent Restlessness, pacing, excessive movement: Absent Repetitive behaviors, motor, and/or verbal: Absent Rapid, loud, or excessive talking:  Absent Sudden changes of mood: Absent Easily initiated or excessive crying and/or laughter: Absent Self-abusiveness, physical and/or verbal: Absent Agitated behavior scale total score: 14  Therapy/Group: Individual Therapy  [name] 09/07/2022, 3:28 PM

## 2022-09-07 NOTE — Care Management (Signed)
Inpatient [facility] Individual Statement of Services  Patient Name:  Vanessa [name]  Date:  09/07/2022  Welcome to the [place].  Our goal is to provide you with an individualized program based on your diagnosis and situation, designed to meet your specific needs.  With this comprehensive rehabilitation program, you will be expected to participate in at least 3 hours of rehabilitation therapies Monday-Friday, with modified therapy programming on the weekends.  Your rehabilitation program will include the following services:  Physical Therapy (PT), Occupational Therapy (OT), Speech Therapy (ST), 24 hour per day rehabilitation nursing, Therapeutic Recreaction (TR), Psychology, Neuropsychology, Care Coordinator, Rehabilitation Medicine, [place], and Other  Weekly team conferences will be held on Tuesdays to discuss your progress.  Your Inpatient Rehabilitation Care Coordinator will talk with you frequently to get your input and to update you on team discussions.  Team conferences with you and your family in attendance may also be held.  Expected length of stay: 7-12 days    Overall anticipated outcome: Supervision  Depending on your progress and recovery, your program may change. Your Inpatient Rehabilitation Care Coordinator will coordinate services and will keep you informed of any changes. Your Inpatient Rehabilitation Care Coordinator's name and contact numbers are listed  below.  The following services may also be recommended but are not provided by the [place] will be made to provide these services after discharge if needed.  Arrangements include referral to agencies that provide these services.  Your insurance has been verified to be:  [facility] Medicare  Your primary  doctor is:  Chartered certified accountant  Pertinent information will be shared with your doctor and your insurance company.  Inpatient Rehabilitation Care Coordinator:  Cathleen Corti [phone] or ([phone]  Information discussed with and copy given to patient by: Rana Snare, 09/07/2022, 2:39 PM

## 2022-09-07 NOTE — Progress Notes (Signed)
PROGRESS NOTE   Subjective/Complaints: She reports she had right shoulder pain yesterday after she was in the wheelchair. Oxycodone keeping pain under control.   LBM today  ROS: Patient denies fever, rash, sore throat, blurred vision, dizziness, nausea, vomiting, diarrhea, cough, shortness of breath or chest pain, headache, or mood change.    Objective:   No results found. Recent Labs    09/07/22 0359  WBC 5.2  HGB 9.4*  HCT 29.3*  PLT 198    Recent Labs    09/07/22 0359  NA 139  K 3.7  CL 104  CO2 25  GLUCOSE 114*  BUN 14  CREATININE 0.71  CALCIUM 9.0     Intake/Output Summary (Last 24 hours) at 09/07/2022 0811 Last data filed at 09/06/2022 1810 Gross per 24 hour  Intake 594 ml  Output --  Net 594 ml         Physical Exam: Vital Signs Blood pressure (!) 162/88, pulse 80, temperature 97.6 F (36.4 C), resp. rate 16, height 5' 6.5" (1.689 m), weight 98.1 kg, SpO2 100 %.  Constitutional: No distress . Vital signs reviewed. Sitting in WC. HEENT: NCAT,conjugate gaze, oral membranes moist Neck: supple Cardiovascular: RRR without murmur. No JVD    Respiratory/Chest: CTA Bilaterally without wheezes or rales. Normal effort    GI/Abdomen: BS +, non-tender, non-distended Ext: no clubbing, cyanosis, or edema Psych: pleasant and cooperative  Skin: pinsites remain clean, dry, dressed. Bruises about RUE/shoulder. Ecchymoses below right eye.  Neuro:  Alert and oriented x 3. Normal insight and awareness. Intact Memory. Normal language and speech. Cranial nerve exam unremarkable. UE limited by ortho but able move left arm and wiggle fingers of both hands. LE's grossly 3-4/5. Sensory exam normal for light touch and pain in all 4 limbs. No limb ataxia or cerebellar signs. No abnormal tone appreciated.   Musculoskeletal: RUE with ex-fix at elbow, left wrist with splint.  No change   Assessment/Plan: 1. Functional  deficits which require 3+ hours per day of interdisciplinary therapy in a comprehensive inpatient rehab setting. Physiatrist is providing close team supervision and 24 hour management of active medical problems listed below. Physiatrist and rehab team continue to assess barriers to discharge/monitor patient progress toward functional and medical goals  Care Tool:  Bathing    Body parts bathed by patient: Chest, Abdomen, Right upper leg, Left upper leg, Face, Right lower leg, Left lower leg   Body parts bathed by helper: Right arm, Left arm     Bathing assist Assist Level: Minimal Assistance - Patient > 75%     Upper Body Dressing/Undressing Upper body dressing   What is the patient wearing?: Pull over shirt, Button up shirt    Upper body assist Assist Level: Maximal Assistance - Patient 25 - 49%    Lower Body Dressing/Undressing Lower body dressing      What is the patient wearing?: Pants     Lower body assist Assist for lower body dressing: Moderate Assistance - Patient 50 - 74%     Toileting Toileting    Toileting assist Assist for toileting: Maximal Assistance - Patient 25 - 49%     Transfers Chair/bed transfer  Transfers assist     Chair/bed transfer assist level: Contact Guard/Touching assist     Locomotion Ambulation   Ambulation assist      Assist level: Contact Guard/Touching assist Assistive device: No Device Max distance: 125 ft   Walk 10 feet activity   Assist     Assist level: Contact Guard/Touching assist Assistive device: No Device   Walk 50 feet activity   Assist    Assist level: Contact Guard/Touching assist Assistive device: No Device    Walk 150 feet activity   Assist Walk 150 feet activity did not occur: Safety/medical concerns         Walk 10 feet on uneven surface  activity   Assist Walk 10 feet on uneven surfaces activity did not occur: Safety/medical concerns         Wheelchair     Assist Is  the patient using a wheelchair?: No   Wheelchair activity did not occur: N/A         Wheelchair 50 feet with 2 turns activity    Assist    Wheelchair 50 feet with 2 turns activity did not occur: N/A       Wheelchair 150 feet activity     Assist  Wheelchair 150 feet activity did not occur: N/A       Blood pressure (!) 162/88, pulse 80, temperature 97.6 F (36.4 C), resp. rate 16, height 5' 6.5" (1.689 m), weight 98.1 kg, SpO2 100 %.  Medical Problem List and Plan: 1. 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             -patient may not shower             -ELOS/Goals: 10-12 days, sup to mod assist ADL's and supervision mobility, mod I cognition  -Continue CIR therapies including PT, OT, and SLP    2.  Antithrombotics: -DVT/anticoagulation:  Pharmaceutical: Lovenox             -antiplatelet therapy: N/a 3. Pain Management: N/A 4. Mood/Behavior/Sleep: .              -antipsychotic agents: N/A  -pt dealing with a lot of adjustment anxiety.   -has hx of anxiety and depression--on cymbalta  -added xanax prn and requested neuropysch assessment at next available time  -providing pt emotional support as possible 5. Neuropsych/cognition: This patient is capable of making decisions on her own behalf. 6. Skin/Wound Care: Monitor wound for healing. Routine pressure relief measures.  7. Fluids/Electrolytes/Nutrition: Monitor I/O. Check CMET in am.              -- recent lab work is within normal limits.  8. Right elbow Fx/dislocation: Per ortho: Nonweightbearing right upper extremity             Okay to move digits and wrist as tolerated             No lifting with right upper extremity              ex fix x 4-6 weeks   -ortho updated wound care orders. Pin sites look good 9. Left distal radius/ulna Fx s/p ORIF 9/14: Nonweightbearing to left wrist.  Maintain splint.             Okay to use left hand for basic ADLs and to feed self  No axial loading of the left wrist             Ice and elevate             Digit motion as tolerated--moves well             No motion restrictions to elbow and shoulder  10. Right proximal humerus Fx:  Nonweightbearing right upper extremity             Okay to move digits and wrist as tolerated             No lifting with right upper extremity  --Plans for [facility] shoulder 09[facility] by Dr. Aundria Rud. Date may be adjusted depending upon rehab progress. Will discuss further with ortho as we progress here. 11. Sundowning/Insomnia: Will start Sleep-wake chart to monitor for disruption             -appears to be improved, continue to monitor 12. Tachycardia: Heart rate up to 140's with activity likely due to deconditioning             --monitor for symptoms with activity.   -Continue propranolol 13. Acute blood loss anemia: Monitor H/H for stability. Trending up from 7.8-->8.6 today.  --Macrocytosis-->check anemia panel/folic acid.  --hgb stable at 9.4 9/25 14. Thrombocytopenia/Neutropenia: Has resolved--likely reactive.  H/o of thrombocytopenia in the past.             --wbcs and plts stable to improved 15. H/o Tremors:  used inderal bid at home-->resumed --no active tremors    17.  Metabolic bone disease:  borderline low @ 30.91. Will start pt on ergocalciferol --cont ergocalciferol 50,000u every 7 days  LOS: 4 days A FACE TO FACE EVALUATION WAS PERFORMED  Fanny Dance 09/07/2022, 8:11 AM

## 2022-09-08 DIAGNOSIS — S42401S Unspecified fracture of lower end of right humerus, sequela: Secondary | ICD-10-CM | POA: Diagnosis not present

## 2022-09-08 DIAGNOSIS — S069X3S Unspecified intracranial injury with loss of consciousness of 1 hour to 5 hours 59 minutes, sequela: Secondary | ICD-10-CM | POA: Diagnosis not present

## 2022-09-08 DIAGNOSIS — T1490XA Injury, unspecified, initial encounter: Secondary | ICD-10-CM | POA: Diagnosis not present

## 2022-09-08 DIAGNOSIS — S069X1A Unspecified intracranial injury with loss of consciousness of 30 minutes or less, initial encounter: Secondary | ICD-10-CM

## 2022-09-08 DIAGNOSIS — S069X1S Unspecified intracranial injury with loss of consciousness of 30 minutes or less, sequela: Secondary | ICD-10-CM

## 2022-09-08 DIAGNOSIS — S52502S Unspecified fracture of the lower end of left radius, sequela: Secondary | ICD-10-CM | POA: Diagnosis not present

## 2022-09-08 NOTE — Progress Notes (Signed)
Patient ID: Vanessa [name], female   DOB: [birth year], 71 y.o.   MRN: [id number]  1136-

## 2022-09-08 NOTE — Progress Notes (Signed)
Occupational Therapy TBI Note  Patient Details  Name: Vanessa [name] MRN: [id number] Date of Birth: 10-15-1951  Today's Date: 09/08/2022 OT Individual Time: 0905-1000 OT Individual Time Calculation (min): 55 min    Short Term Goals: Week 1:  OT Short Term Goal 1 (Week 1): STG= LTG d/t ELOS  Skilled Therapeutic Interventions/Progress Updates:     Pt received in bed with no pain agreeable to LB dressing, toileting, and functional mobility in hallway  ADL: Pt completes ADL at overall Supervision Level. Skilled interventions include: Pt educated on toilet tongs for use with after BM, education on strategies for LB dressing seated for doffing/donning over feet and strategies for advancing pants past hips. Pt completes toilet transfers with increased cuing for anterior weight shift/momentum.  Therapeutic activity Functional mobility with NO AD with cuing for not pulling up on sink with LUE during sit to stand. Overall CGA for mobility with OT calling out head turns/gaze direction to challenge balance/proprioception with min increased postural sway +minimally delayed righting reactions.   Pt left at end of session in bed with exit alarm on, call light in reach and all needs met   Therapy Documentation Precautions:  Precautions Precautions: Fall, Other (comment) Precaution Comments: external fixator R UE. RUE NWB. LUE no wrist weightbearing. Ok to move fingers bilaterally. Ok to use LUE for ADLs/self feeding Required Braces or Orthoses: Splint/Cast Splint/Cast: external fixator R UE; splint L UE Splint/Cast - Date Prophylactic Dressing Applied (if applicable): 37/35/78 Restrictions Weight Bearing Restrictions: Yes RUE Weight Bearing: Non weight bearing LUE Weight Bearing: Non weight bearing Other Position/Activity Restrictions: LUE: permitted to use L hand for basic ADLs and feed self; L UE digit motion as tolerated, no ROM restrictions to L elbow and shoulder. RUE: no lifting with R  UE; permitted to move R digits and wrist as tolerated Agitated Behavior Scale: TBI ABS discontinued d/t ABS score less than 20 for the last three days or no behaviors present     Therapy/Group: Individual Therapy  Tonny Branch 09/08/2022, 6:52 AM

## 2022-09-08 NOTE — Patient Care Conference (Signed)
Inpatient RehabilitationTeam Conference and Plan of Care Update Date: 09/08/2022   Time: 1019 AM   Patient Name: Vanessa [name]      Medical Record Number: [id number]  Date of Birth: September 12, 1951 Sex: Female         Room/Bed: 4W05C/4W05C-01 Payor Info: Payor: [employer] park [employer] MEDICARE / Plan: [employer] MEDICARE / Product Type: *No Product type* /    Admit Date/Time:  09/03/2022  4:48 PM  Primary Diagnosis:  Traumatic brain injury with loss of consciousness of 1 hour to 5 hours 59 minutes [facility])  Hospital Problems: Principal Problem:   Traumatic brain injury with loss of consciousness of 1 hour to 5 hours 59 minutes [facility]) Active Problems:   Trauma    Expected Discharge Date: Expected Discharge Date: 09/10/22  Team Members Present: Physician leading conference: Dr. Alger Simons Social Worker Present: Loralee Pacas, [place] Nurse Present: Other (comment) Tacy Learn, RN) PT Present: Apolinar Junes, PT OT Present: Mariane Masters, OT SLP Present: Weston Anna, SLP PPS Coordinator present : Gunnar Fusi, SLP     Current Status/Progress Goal Weekly Team Focus  Bowel/Bladder   Continent x2.  Remain continent.  Monitor bowel and bladder changes.   Swallow/Nutrition/ Hydration             ADL's   CGA funcitonal mobility, up to MAX A for toileting, MOD A for UB dressing, up to MAX A for LB dressing  S-MIN A  adaptive ADL strategies, balance, endurance, activity tolerance   Mobility   CGA overall, gait >250 ft, 8 steps no rails  Supervision overall  balance, safety awareness, sit to stand transfers from household surfaces, gait and stair training, activity tolerance, patient/caregiver education   Communication             Safety/Cognition/ Behavioral Observations  Supervision-Min A  Supervision  functional problem solving, anticipatory awareness, recall with use of strategies   Pain   Pain 6/10 to RUE. Prn meds helping.  Pain less than or equal to 2.  Assess pain  q shift/prn.   Skin   Kerlix to RUE rods. Compression wrap to LUE.  No skin breakdown.  Assess skin q shift and prn.     Discharge Planning:  D/c to home with support from husband who can provide 24/7 care.   Team Discussion: TBI with LOC. Behavior plan in place. Continent B/B. LBM 09/26. Pain managed with PRNs. Fixator to RUE, splint to LUE. NWB. PICC line. Right shoulder replacement 28th. Will plan to discharge from unit and surgery, then home.  Patient on target to meet rehab goals: yes, ambulating >250 feet and taking 8 ste without rails  *See Care Plan and progress notes for long and short-term goals.   Revisions to Treatment Plan:  Monitor labs  Teaching Needs: Medications, safety, gait/transfer training, skin/wound care, etc  Current Barriers to Discharge: Decreased caregiver support, Home enviroment access/layout, Wound care, Weight, Weight bearing restrictions, and Pending surgery  Possible Resolutions to Barriers: Family eduction, medication education, skin/wound care, order recommended DME     Medical Summary Current Status: TBI and poly trauma with right elbow dislocation and fx, right prox humerus fx. improving cognitively  Barriers to Discharge: Medical stability   Possible Resolutions to [employer] Focus: ex-fix and wound care education. transfer to surgery thursday for right TSA, anxiety mgt   Continued Need for Acute Rehabilitation Level of Care: The patient requires daily medical management by a physician with specialized training in physical medicine and rehabilitation for the following reasons:  Direction of a multidisciplinary physical rehabilitation program to maximize functional independence : Yes Medical management of patient stability for increased activity during participation in an intensive rehabilitation regime.: Yes Analysis of laboratory values and/or radiology reports with any subsequent need for medication adjustment and/or medical  intervention. : Yes   I attest that I was present, lead the team conference, and concur with the assessment and plan of the team.   Jearld Adjutant 09/08/2022, 1:25 PM

## 2022-09-08 NOTE — Progress Notes (Signed)
Physical Therapy Session Note  Patient Details  Name: Vanessa [name] MRN: [id number] Date of Birth: Aug 22, 1951  Today's Date: 09/08/2022 PT Individual Time: 1130-1155 and 1420-1535 PT Individual Time Calculation (min): 25 min and 75 min  Short Term Goals: Week 1:  PT Short Term Goal 1 (Week 1): Pt will perform all bed mobility with consistent supervision. PT Short Term Goal 2 (Week 1): Pt will perform all standing transfers with consistent CGA. PT Short Term Goal 3 (Week 1): Pt will ambulate >200 ft with close supervision/ CGA and maintain HR<120 bpm. PT Short Term Goal 4 (Week 1): Pt will initiate stair training.  Skilled Therapeutic Interventions/Progress Updates:     Session 1: Patient in bed with her husband and friends in the room upon PT arrival. Patient alert and agreeable to PT session. Patient reported 6/10 R upper extremity pain during session, RN made aware. PT provided repositioning, rest breaks, and distraction as pain interventions throughout session.   Patient and her husband eager to hear about team conference. Discussed d/c planning, ELOS, and set up family education for tomorrow from 1-3 with patient's husband, CSW and scheduling made aware.   Therapeutic Activity: Bed Mobility: Patient performed supine to/from sit with supervision-mod I with HOB elevated, patient to sleep in a lift recliner at home. Provided cue for not L wrist weight bearing x1. Transfers: Patient performed sit to/from stand x2 and with blocked practice 2x10 with supervision, needed x2 attempts on first trial. Provided verbal cues for scooting forward, foot placement, and forward weight shift to boost up without use of upper extremities.  Gait Training:  Patient ambulated ~300 feet without AD with close supervision. Ambulated with decreased gait speed, decreased step length and height, increased lateral trunk sway with trendelenburg gait, and forward trunk lean. Provided verbal cues for increased gait  speed for improved balance without AD and increased lateral hip activation in stance.  Patient sitting up in bed with R arm elevated with an ice pack applied at end of session with breaks locked, bed alarm set, and all needs within reach.   Session 2: Patient in bed in the room upon PT arrival. Patient alert and agreeable to PT session. Patient reported 6/10 R upper extremity pain during session, RN made aware and provided pain medication during session. PT provided repositioning, rest breaks, and distraction as pain interventions throughout session.   Therapeutic Activity: Bed Mobility: Patient performed supine to/from sit with supervision-mod I with HOB elevated, patient to sleep in a lift recliner at home. Patient able to teach back no L wrist weight bearing. Transfers: Patient performed sit to/from stand x7 needed x2 attempts x3 and CGA-min A for boosting up from lower seat height due to increased fatigue this afternoon. Provided verbal cues for scooting forward, foot placement, and forward weight shift to boost up without use of upper extremities.  Gait Training:  Patient ambulated 100-150 feet x3 without AD with close supervision. Ambulated with decreased gait speed, decreased step length and height, increased lateral trunk sway with trendelenburg gait, and forward trunk lean. Provided verbal cues for increased gait speed for improved balance without AD and increased lateral hip activation in stance. 6 Min Walk Test:  Instructed patient to ambulate as quickly and as safely as possible for 6 minutes using LRAD. Patient was allowed to take standing rest breaks without stopping the test, but if the patient required a sitting rest break the clock would be stopped and the test would be over.  Results: 109  feet (296.9 meters, Avg speed 0.8 m/s) without AD with supervision. Results indicate that the patient has reduced endurance with ambulation compared to age matched norms.  Age Matched Norms: 35-69  yo F: 538 meters MDC: 58.21 meters (190.98 feet) or 50 meters (ANPTA Core Set of Outcome Measures for Adults with Neurologic Conditions, 2018)  Patient ascended/descended 4x6" with CGA support under L elbow for balance to maintain UE precautions. Performed step-to gait pattern leading with R and the way up an L on the way down. Provided cues for technique and sequencing.   Neuromuscular Re-ed: Functional Gait  Assessment Gait Level Surface: Walks 20 ft in less than 7 sec but greater than 5.5 sec, uses assistive device, slower speed, mild gait deviations, or deviates 6-10 in outside of the 12 in walkway width. Change in Gait Speed: Able to change speed, demonstrates mild gait deviations, deviates 6-10 in outside of the 12 in walkway width, or no gait deviations, unable to achieve a major change in velocity, or uses a change in velocity, or uses an assistive device. Gait with Horizontal Head Turns: Performs head turns smoothly with slight change in gait velocity (eg, minor disruption to smooth gait path), deviates 6-10 in outside 12 in walkway width, or uses an assistive device. Gait with Vertical Head Turns: Performs task with moderate change in gait velocity, slows down, deviates 10-15 in outside 12 in walkway width but recovers, can continue to walk. Gait and Pivot Turn: Pivot turns safely in greater than 3 sec and stops with no loss of balance, or pivot turns safely within 3 sec and stops with mild imbalance, requires small steps to catch balance. Step Over Obstacle: Is able to step over one shoe box (4.5 in total height) but must slow down and adjust steps to clear box safely. May require verbal cueing. Gait with Narrow Base of Support: Ambulates less than 4 steps heel to toe or cannot perform without assistance. Gait with Eyes Closed: Walks 20 ft, uses assistive device, slower speed, mild gait deviations, deviates 6-10 in outside 12 in walkway width. Ambulates 20 ft in less than 9 sec but greater  than 7 sec. Ambulating Backwards: Walks 20 ft, uses assistive device, slower speed, mild gait deviations, deviates 6-10 in outside 12 in walkway width. Steps: Two feet to a stair, must use rail. (unable to use rail due to UE weight bearing restrictions, CGA support on L elbow required for steadying support) Total Score: 15/30 Patient demonstrates increased fall risk as noted by score of 15/30 on  Functional Gait Assessment.   <22/30 = predictive of falls, <20/30 = fall in 6 months, <18/30 = predictive of falls in PD MCID: 5 points stroke population, 4 points geriatric population (ANPTA Core Set of Outcome Measures for Adults with Neurologic Conditions, 2018)  Provided patient with handouts for [facility], TBI education, falls education, and HEP and reviewed them with patient during session, will review with her husband during family education tomorrow.  HEP Access Code: [email or web address] - Sit to Stand with Arms Crossed  - 2 x daily - 7 x weekly - 2 sets - 10 reps - Seated March  - 2 x daily - 7 x weekly - 2 sets - 10 reps - Mini Squat  - 2 x daily - 7 x weekly - 2 sets - 10 reps - Seated Hip Abduction with Resistance  - 2 x daily - 7 x weekly - 2 sets - 10 reps  Patient sitting up in bed with R  arm elevated with an ice pack applied at end of session with breaks locked, bed alarm set, and all needs within reach.   Therapy Documentation Precautions:  Precautions Precautions: Fall, Other (comment) Precaution Comments: external fixator R UE. RUE NWB. LUE no wrist weightbearing. Ok to move fingers bilaterally. Ok to use LUE for ADLs/self feeding Required Braces or Orthoses: Splint/Cast Splint/Cast: external fixator R UE; splint L UE Splint/Cast - Date Prophylactic Dressing Applied (if applicable): 123456 Restrictions Weight Bearing Restrictions: Yes RUE Weight Bearing: Non weight bearing LUE Weight Bearing: Non weight bearing Other Position/Activity Restrictions: LUE: permitted to use L hand for basic  ADLs and feed self; L UE digit motion as tolerated, no ROM restrictions to L elbow and shoulder. RUE: no lifting with R UE; permitted to move R digits and wrist as tolerated    Therapy/Group: Individual Therapy  Keren Alverio L Jamisen Duerson PT, DPT, NCS, CBIS  09/08/2022, 2:49 PM

## 2022-09-08 NOTE — Consult Note (Signed)
Neuropsychological Consultation   Patient:   Vanessa [name]   DOB:   1951-05-25  MR Number:  [id number]  Location:  [facility] [id number] [place] [address] Dept: [place]: [phone]           Date of Service:   09/08/2022  Start Time:   1 PM End Time:   2 PM  Provider/Observer:  Ilean Skill, Psy.D.       Clinical Neuropsychologist       Billing Code/Service: [phone]  Chief Complaint:    Vanessa [name] is a 71 year old female with past medical history including hypertension, tremors, myalgia, restless leg syndrome with insomnia, anxiety/depression.  Patient was admitted on 08/25/2022 after being found at the bottom of 12 stairs.  Patient is described in medical records as having altered awareness and consciousness at this time after being found by her husband.  Patient denies any recall for events immediately before during and after her fall.  Patient reported nighttime headache and right arm pain.  Patient with multiple traumatic injuries including right orbital hematoma with facial abrasions, gross deformities right elbow and left wrist, mental status changes at the time and was perseverative at admission.  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  Patient with orthopedic interventions with weightbearing limitations.  Patient recommended for conservative care regard I small brain bleeds with improved neurologic functioning.  Patient referred to CIR for comprehensive inpatient rehab.  Reason for  Service:  Patient was referred for neuropsychological consultation due to anxiety and depressive symptoms including coping difficulties with recent hospitalization.  Below is the HPI for the current admission.  HPI:  Vanessa [name] is a 71 year old female with history of HTN, Tremors, myalgia, RLS w/insomnia, anxiety/depression who was admitted on 08/25/22 after being found at the bottom of 12 stairs with reports of headache and right arm pain. She does not recall events leading to fall but thinks that she taken double dose of sleeping pills, may have gotten disoriented, turned wrong way and fell down the stairs. She was noted to have right orbital hematoma with facial abrasions, gross deformities right elbow and left wrist, had mental status changes and was perseverative at admission. She was found to have trace left and parafalcine SDH with trace SAH, supraorbital scalp hematoma, displaced angulated right humerus head neck fx w Fx of glenoid rim/coracoid process, posterior dislocation of radius with comminution of proximal right radius and supracondylar Fx of humerus, comminuted displaced fractures of distal left radius and ulna with radiocarpal joint involvement as well as incidental findings of advanced C6/C7 degeneration with moderate to severe foraminal stenosis and moderate facet arthropathy L4 and L5 with mild anterolisthesis L4 on L5. She was evaluated by ortho with reduction of right elbow and left distal/radius Fx with splinting and NWB. She was taken to OR on 08/27/22 for ORIF left distal radius and ulna Fxs and right radial head arthroplasty with right elbow ligament reconstruction by Dr. Marcelino Scot on 09/14. Post op NWB RUE and Left wrist--Ok to use left hand for self feed and ADLs but no axial loading. Plans for right total  shoulder  arthroplasty by Dr. Stann Mainland next week.    Follow up CT RUE showed external hardware without evidence of hardware loosening with fat stranding, edema and post op  changes. No further surgeries and external fixator to stay in place for 4-6 weeks. Dr. Saintclair Halsted recommended conservative care with repeat CTH prn neurological changes.  Post op anemia being monitored and improving.   Current Status:  Patient was awake and oriented x4 with good cognition and mental status.  She was laying in the bed slightly elevated with external fixation on her right arm.  Patient acknowledged having significant anxiety earlier in her hospitalization but reports this is improved a great deal over the past couple of days.  Patient reports that she is looking forward to transfer home on this coming Thursday (2 days) and that her husband comes in tomorrow for family therapies.  Patient reports that she feels like she has returned to baseline as far as cognitive functioning.  Patient reports that she is doing much better with anxiety/depression.  Reports that she continues to struggle with sleep, but that is long issue just exacerbated by current injuries.  Discharge in this coming Thursday.    Behavioral Observation: Vanessa DEFORD  presents as a 71 y.o.-year-old Right handed Caucasian Female who appeared her stated age. her dress was Appropriate and she was Well Groomed and her manners were Appropriate to the situation.  her participation was indicative of Appropriate and Attentive behaviors.  There were physical disabilities noted.  she displayed an appropriate level of cooperation and motivation.     Interactions:    Active Appropriate  Attention:   within normal limits and attention span and concentration were age appropriate  Memory:   within normal limits; recent and remote memory intact with the exception to the event sometimes right around her fall.  Visuo-spatial:  within normal limits  Speech (Volume):  normal  Speech:   normal; normal  Thought Process:  Coherent and Relevant  Though Content:  WNL; not suicidal and not homicidal  Orientation:   person, place,  time/date, and situation  Judgment:   Good  Planning:   Fair  Affect:    Appropriate  Mood:    Euthymic  Insight:   Good  Intelligence:   normal  Medical History:   Past Medical History:  Diagnosis Date   Anxiety    Arthritis    Bladder prolapse, female, acquired    COVID-19 03/2022   Depression    Hypertension    Myalgia    Occasional tremors    OSA (obstructive sleep apnea)    mild   Restless leg syndrome    Thrombocytopenia ([place]) in the past          Patient Active Problem List   Diagnosis Date Noted   Traumatic brain injury with loss of consciousness of 30 minutes or less ([place])    Traumatic brain injury with loss of consciousness of 1 hour to 5 hours 59 minutes ([place]) 09/04/2022   Trauma 08/25/2022   Thrombocytopenia ([place]) 02/24/2016   Insomnia 08/09/2015   Snoring 10/14/2014   Hypertension 03/09/2012   Hyperlipidemia 03/09/2012   Depression 03/09/2012     Psychiatric History:  Patient with past history of anxiety reports that this did become exacerbated with her hospitalization initially.  She reports that her depression and anxiety have been back to his baseline and denies it having a significant impact on her ability to fully participate in therapeutic interventions.  Family Med/Psych History:  Family History  Problem Relation Age of Onset   Cancer Mother    Impression/DX:  BURNETTA KOU is a 71 year old female with past medical history including hypertension, tremors, myalgia, restless leg syndrome with insomnia, anxiety/depression.  Patient was admitted on 08/25/2022 after being found at the bottom of 12 stairs.  Patient is described in medical records as having altered awareness and consciousness at this time after being found by her husband.  Patient denies any recall for events immediately before during and after her fall.  Patient reported nighttime headache and right arm pain.  Patient with multiple traumatic injuries including right orbital  hematoma with facial abrasions, gross deformities right elbow and left wrist, mental status changes at the time and was perseverative at admission.  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  Patient with orthopedic interventions with weightbearing limitations.  Patient recommended for conservative care regard I small brain bleeds with improved neurologic functioning.  Patient referred to CIR for comprehensive inpatient rehab.  Disposition/Plan:  Today we worked on coping and adjustment with the patient acknowledging that she was dealing with significant anxiety a few days prior.  Reports that as she made gains and got closer to discharge that some of this has improved significantly.  She reports that her discussions with Dr. Naaman Plummer helped a great deal at the time.  Patient does have prior diagnosis of obstructive sleep apnea but has not been using CPAP.  I did encourage the patient to follow-up on that potential issue with outpatient as it could play a role in her recuperative process if she does not fact have unmanaged/treated obstructive sleep apnea.          Electronically Signed   _______________________ Ilean Skill, Psy.D. Clinical Neuropsychologist

## 2022-09-08 NOTE — Progress Notes (Signed)
Speech Language Pathology TBI Note  Patient Details  Name: Vanessa [name] MRN: [id number] Date of Birth: 07/29/1951  Today's Date: 09/08/2022 SLP Individual Time: 0730-0800 SLP Individual Time Calculation (min): 30 min  Short Term Goals: Week 1: SLP Short Term Goal 1 (Week 1): STG = LTG d/t ELOS  Skilled Therapeutic Interventions: Skilled treatment session focused on cognitive goals. SLP facilitated session by providing Mod I for functional problem solving during a basic money management task. Supervision level verbal cues were needed for working memory during a complex scheduling task. Patient was Mod I for problem solving. Patient left upright in bed with alarm on and all needs within reach. Continue with current plan of care.     Pain No/Denies Pain   Agitated Behavior Scale: TBI Observation Details Observation Environment: [facility] Start of observation period - Date: 09/08/22 Start of observation period - Time: 0730 End of observation period - Date: 09/08/22 End of observation period - Time: 0800 Agitated Behavior Scale (DO NOT LEAVE BLANKS) Short attention span, easy distractibility, inability to concentrate: Absent Impulsive, impatient, low tolerance for pain or frustration: Absent Uncooperative, resistant to care, demanding: Absent Violent and/or threatening violence toward people or property: Absent Explosive and/or unpredictable anger: Absent Rocking, rubbing, moaning, or other self-stimulating behavior: Absent Pulling at tubes, restraints, etc.: Absent Wandering from treatment areas: Absent Restlessness, pacing, excessive movement: Absent Repetitive behaviors, motor, and/or verbal: Absent Rapid, loud, or excessive talking: Absent Sudden changes of mood: Absent Easily initiated or excessive crying and/or laughter: Absent Self-abusiveness, physical and/or verbal: Absent Agitated behavior scale total score: 14  Therapy/Group: Individual Therapy  [name],  [place] 09/08/2022, 3:12 PM

## 2022-09-08 NOTE — Progress Notes (Signed)
Copy of behavior plan to charge and nurse. 

## 2022-09-08 NOTE — Progress Notes (Signed)
PROGRESS NOTE   Subjective/Complaints:  Had a good night. Slept well. Pain controlled. Had a few questions about plan this week. Surgery is scheduled for Thursday at [facility]: Patient denies fever, rash, sore throat, blurred vision, dizziness, nausea, vomiting, diarrhea, cough, shortness of breath or chest pain,   headache, or mood change.    Objective:   No results found. Recent Labs    09/07/22 0359  WBC 5.2  HGB 9.4*  HCT 29.3*  PLT 198   Recent Labs    09/07/22 0359  NA 139  K 3.7  CL 104  CO2 25  GLUCOSE 114*  BUN 14  CREATININE 0.71  CALCIUM 9.0    Intake/Output Summary (Last 24 hours) at 09/08/2022 0919 Last data filed at 09/08/2022 0806 Gross per 24 hour  Intake 714 ml  Output --  Net 714 ml        Physical Exam: Vital Signs Blood pressure 138/76, pulse 94, temperature 98 F (36.7 C), temperature source Oral, resp. rate 16, height 5' 6.5" (1.689 m), weight 98.1 kg, SpO2 100 %.   Constitutional: No distress . Vital signs reviewed. HEENT: NCAT, EOMI, oral membranes moist Neck: supple Cardiovascular: RRR without murmur. No JVD    Respiratory/Chest: CTA Bilaterally without wheezes or rales. Normal effort    GI/Abdomen: BS +, non-tender, non-distended Ext: no clubbing, cyanosis, or edema Psych: pleasant and cooperative  Skin: pinsites are clean, dry, dressed. Bruises about RUE/shoulder. Ecchymoses below right eye.   Neuro:  Alert and oriented x 3. Normal insight and awareness. Intact Memory. Normal language and speech. Cranial nerve exam unremarkable. UE limited by ortho but able move left arm and wiggle fingers of both hands. LE's grossly 3-4/5. Sensory exam normal for light touch and pain in all 4 limbs. No limb ataxia or cerebellar signs. No abnormal tone appreciated.---exam stable  Musculoskeletal: RUE with ex-fix at elbow, left wrist with splint--becoming a bit ragged looking.  No  change   Assessment/Plan: 1. Functional deficits which require 3+ hours per day of interdisciplinary therapy in a comprehensive inpatient rehab setting. Physiatrist is providing close team supervision and 24 hour management of active medical problems listed below. Physiatrist and rehab team continue to assess barriers to discharge/monitor patient progress toward functional and medical goals  Care Tool:  Bathing    Body parts bathed by patient: Chest, Abdomen, Right upper leg, Left upper leg, Face, Right lower leg, Left lower leg   Body parts bathed by helper: Right arm, Left arm     Bathing assist Assist Level: Minimal Assistance - Patient > 75%     Upper Body Dressing/Undressing Upper body dressing   What is the patient wearing?: Pull over shirt, Button up shirt    Upper body assist Assist Level: Maximal Assistance - Patient 25 - 49%    Lower Body Dressing/Undressing Lower body dressing      What is the patient wearing?: Pants     Lower body assist Assist for lower body dressing: Moderate Assistance - Patient 50 - 74%     Toileting Toileting    Toileting assist Assist for toileting: Maximal Assistance - Patient 25 - 49%  Transfers Chair/bed transfer  Transfers assist     Chair/bed transfer assist level: Contact Guard/Touching assist     Locomotion Ambulation   Ambulation assist      Assist level: Contact Guard/Touching assist Assistive device: No Device Max distance: 125 ft   Walk 10 feet activity   Assist     Assist level: Contact Guard/Touching assist Assistive device: No Device   Walk 50 feet activity   Assist    Assist level: Contact Guard/Touching assist Assistive device: No Device    Walk 150 feet activity   Assist Walk 150 feet activity did not occur: Safety/medical concerns         Walk 10 feet on uneven surface  activity   Assist Walk 10 feet on uneven surfaces activity did not occur: Safety/medical  concerns         Wheelchair     Assist Is the patient using a wheelchair?: No             Wheelchair 50 feet with 2 turns activity    Assist            Wheelchair 150 feet activity     Assist          Blood pressure 138/76, pulse 94, temperature 98 F (36.7 C), temperature source Oral, resp. rate 16, height 5' 6.5" (1.689 m), weight 98.1 kg, SpO2 100 %.  Medical Problem List and Plan: 1. 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             -patient may not shower             -ELOS/Goals: 10-12 days, sup to mod assist ADL's and supervision mobility, mod I cognition  -Continue CIR therapies including PT, OT, and SLP. Interdisciplinary team conference today to discuss goals, barriers to discharge, and dc planning.   2.  Antithrombotics: -DVT/anticoagulation:  Pharmaceutical: Lovenox             -antiplatelet therapy: N/a 3. Pain Management: N/A 4. Mood/Behavior/Sleep: .              -antipsychotic agents: N/A  -pt dealing with a lot of adjustment anxiety.   -has hx of anxiety and depression--on cymbalta  -added xanax prn    -Dr. Sima Matas to see pt today  -providing pt emotional support as possible 5. Neuropsych/cognition: This patient is capable of making decisions on her own behalf. 6. Skin/Wound Care: Monitor wound for healing. Routine pressure relief measures.  7. Fluids/Electrolytes/Nutrition: Monitor I/O. Check CMET in am.              -- recent lab work is within normal limits.  8. Right elbow Fx/dislocation: Per ortho: Nonweightbearing right upper extremity             Okay to move digits and wrist as tolerated             No lifting with right upper extremity              ex fix x 4-6 weeks   -ortho updated wound care orders. Pin sites look good  9/25- made pt NPO AM of 9/28 for R shoulder surgery- waiting ot hear  timing 9. Left distal radius/ulna Fx s/p ORIF 9/14: Nonweightbearing to left wrist. Maintain splint.             Okay to use left hand for  basic ADLs and to feed self             No axial loading of the left wrist             Ice and elevate             Digit motion as tolerated--moves well             No motion restrictions to elbow and shoulder  10. Right proximal humerus Fx:  Nonweightbearing right upper extremity             Okay to move digits and wrist as tolerated             No lifting with right upper extremity  --Plans for total shoulder 09/28 at [facility] by Dr. Stann Mainland. discuss with team today 11. Sundowning/Insomnia: Will start Sleep-wake chart to monitor for disruption             -seemed to have a better night. Continue to observe.  12. Tachycardia: Heart rate up to 140's with activity likely due to deconditioning             --monitor for symptoms with activity.  13. Acute blood loss anemia: Monitor H/H for stability. Trending up from 7.8-->8.6 today.  --Macrocytosis-->check anemia panel/folic acid.  --hgb stable at 8.6 9/22 14. Thrombocytopenia/Neutropenia: Has resolved--likely reactive.  H/o of thrombocytopenia in the past.             --wbcs and plts stable to improved 15. H/o Tremors:  used inderal bid at home-->resumed --no active tremors    17.  Metabolic bone disease:  borderline low @ 30.91. started pt on ergocalciferol --on vitamin D supplement.     LOS: 5 days A FACE TO FACE EVALUATION WAS PERFORMED  Meredith Staggers 09/08/2022, 9:19 AM

## 2022-09-09 ENCOUNTER — Encounter (HOSPITAL_COMMUNITY): Payer: Self-pay | Admitting: Orthopedic Surgery

## 2022-09-09 DIAGNOSIS — S52202S Unspecified fracture of shaft of left ulna, sequela: Secondary | ICD-10-CM

## 2022-09-09 DIAGNOSIS — T1490XA Injury, unspecified, initial encounter: Secondary | ICD-10-CM | POA: Diagnosis not present

## 2022-09-09 DIAGNOSIS — S52502S Unspecified fracture of the lower end of left radius, sequela: Secondary | ICD-10-CM | POA: Diagnosis not present

## 2022-09-09 DIAGNOSIS — S42401S Unspecified fracture of lower end of right humerus, sequela: Secondary | ICD-10-CM | POA: Diagnosis not present

## 2022-09-09 DIAGNOSIS — S069X3S Unspecified intracranial injury with loss of consciousness of 1 hour to 5 hours 59 minutes, sequela: Secondary | ICD-10-CM | POA: Diagnosis not present

## 2022-09-09 DIAGNOSIS — S42351A Displaced comminuted fracture of shaft of humerus, right arm, initial encounter for closed fracture: Secondary | ICD-10-CM | POA: Insufficient documentation

## 2022-09-09 DIAGNOSIS — D62 Acute posthemorrhagic anemia: Secondary | ICD-10-CM | POA: Insufficient documentation

## 2022-09-09 MED ORDER — ENOXAPARIN SODIUM 30 MG/0.3ML IJ SOSY: 30.0000 mg | PREFILLED_SYRINGE | Freq: Two times a day (BID) | INTRAMUSCULAR | Status: DC

## 2022-09-09 MED ORDER — METHOCARBAMOL 1000 MG PO TABS: 1000.0000 mg | ORAL_TABLET | Freq: Three times a day (TID) | ORAL | 0 refills | Status: DC

## 2022-09-09 MED ORDER — PANTOPRAZOLE SODIUM 40 MG PO TBEC: 40.0000 mg | DELAYED_RELEASE_TABLET | Freq: Every day | ORAL | 0 refills | Status: DC

## 2022-09-09 MED ORDER — ALPRAZOLAM 0.5 MG PO TABS: 0.5000 mg | ORAL_TABLET | Freq: Two times a day (BID) | ORAL | 0 refills | Status: DC | PRN

## 2022-09-09 MED ORDER — MELATONIN 5 MG PO TABS: 5.0000 mg | ORAL_TABLET | Freq: Every evening | ORAL | 0 refills | Status: DC | PRN

## 2022-09-09 MED ORDER — OXYCODONE HCL 10 MG PO TABS: 10.0000 mg | ORAL_TABLET | Freq: Four times a day (QID) | ORAL | 0 refills | Status: DC | PRN

## 2022-09-09 MED ORDER — VITAMIN D3 25 MCG PO TABS: 1000.0000 [IU] | ORAL_TABLET | Freq: Every day | ORAL | Status: DC

## 2022-09-09 MED ORDER — VITAMIN D (ERGOCALCIFEROL) 1.25 MG (50000 UNIT) PO CAPS: 50000.0000 [IU] | ORAL_CAPSULE | ORAL | 0 refills | Status: DC

## 2022-09-09 MED ORDER — POLYETHYLENE GLYCOL 3350 17 G PO PACK: 17.0000 g | PACK | Freq: Every day | ORAL | 0 refills | Status: DC

## 2022-09-09 MED ORDER — PANTOPRAZOLE SODIUM 40 MG PO TBEC: 40.0000 mg | DELAYED_RELEASE_TABLET | Freq: Every day | ORAL | Status: DC

## 2022-09-09 MED ORDER — ASCORBIC ACID 500 MG PO TABS: 500.0000 mg | ORAL_TABLET | Freq: Every day | ORAL | Status: DC

## 2022-09-09 MED ORDER — TROLAMINE SALICYLATE 10 % EX CREA: TOPICAL_CREAM | Freq: Two times a day (BID) | CUTANEOUS | 0 refills | Status: DC | PRN

## 2022-09-09 MED ORDER — VITAMIN D (ERGOCALCIFEROL) 1.25 MG (50000 UNIT) PO CAPS: 50000.0000 [IU] | ORAL_CAPSULE | ORAL | Status: DC

## 2022-09-09 MED ORDER — DOCUSATE SODIUM 100 MG PO CAPS: 100.0000 mg | ORAL_CAPSULE | Freq: Two times a day (BID) | ORAL | 0 refills | Status: DC

## 2022-09-09 MED ORDER — FERROUS SULFATE 325 (65 FE) MG PO TABS: 325.0000 mg | ORAL_TABLET | Freq: Every day | ORAL | 0 refills | Status: DC

## 2022-09-09 MED ORDER — ALBUTEROL SULFATE HFA 108 (90 BASE) MCG/ACT IN AERS: 2.0000 | INHALATION_SPRAY | RESPIRATORY_TRACT | 1 refills | Status: DC | PRN

## 2022-09-09 MED ORDER — ACETAMINOPHEN 325 MG PO TABS: 650.0000 mg | ORAL_TABLET | Freq: Three times a day (TID) | ORAL | Status: DC

## 2022-09-09 MED ORDER — FERROUS SULFATE 325 (65 FE) MG PO TABS: 325.0000 mg | ORAL_TABLET | Freq: Every day | ORAL | 3 refills | Status: DC

## 2022-09-09 MED ORDER — METHOCARBAMOL 1000 MG PO TABS: 1000.0000 mg | ORAL_TABLET | Freq: Three times a day (TID) | ORAL | Status: DC

## 2022-09-09 NOTE — Progress Notes (Signed)
Received call back that patient needs to be at [facility] no later than 10:30 am. Ortho plans on putting in pre-op orders. CM/LCSW made aware.

## 2022-09-09 NOTE — Progress Notes (Signed)
PROGRESS NOTE   Subjective/Complaints:  Had another pretty good night. No new complaints. Appreciated her visit with Dr. Kieth Brightly  ROS: Patient denies fever, rash, sore throat, blurred vision, dizziness, nausea, vomiting, diarrhea, cough, shortness of breath or chest pain,  neck pain, headache, or mood change.    Objective:   No results found. Recent Labs    09/07/22 0359  WBC 5.2  HGB 9.4*  HCT 29.3*  PLT 198   Recent Labs    09/07/22 0359  NA 139  K 3.7  CL 104  CO2 25  GLUCOSE 114*  BUN 14  CREATININE 0.71  CALCIUM 9.0    Intake/Output Summary (Last 24 hours) at 09/09/2022 0830 Last data filed at 09/08/2022 1815 Gross per 24 hour  Intake 473 ml  Output --  Net 473 ml        Physical Exam: Vital Signs Blood pressure (!) 156/79, pulse 74, temperature 97.8 F (36.6 C), temperature source Oral, resp. rate 16, height 5' 6.5" (1.689 m), weight 98.1 kg, SpO2 100 %.   Constitutional: No distress . Vital signs reviewed. HEENT: NCAT, EOMI, oral membranes moist Neck: supple Cardiovascular: RRR without murmur. No JVD    Respiratory/Chest: CTA Bilaterally without wheezes or rales. Normal effort    GI/Abdomen: BS +, non-tender, non-distended Ext: no clubbing, cyanosis, or edema Psych: pleasant and cooperative  Skin: pinsites are clean, dry, dressed. Bruises about RUE/shoulder. Ecchymoses below right eye.   Neuro:  Alert and oriented x 3. Normal insight and awareness. Occ cues for ST Memory. Normal language and speech. Cranial nerve exam unremarkable. UE limited by ortho but able move left arm and wiggle fingers of both hands. LE's grossly 3-4/5. Sensory exam normal for light touch and pain in all 4 limbs. No limb ataxia or cerebellar signs. No abnormal tone appreciated.---exam stable  Musculoskeletal: RUE with ex-fix at elbow, left wrist with splint--becoming a bit ragged looking but  intact   Assessment/Plan: 1. Functional deficits which require 3+ hours per day of interdisciplinary therapy in a comprehensive inpatient rehab setting. Physiatrist is providing close team supervision and 24 hour management of active medical problems listed below. Physiatrist and rehab team continue to assess barriers to discharge/monitor patient progress toward functional and medical goals  Care Tool:  Bathing    Body parts bathed by patient: Chest, Abdomen, Right upper leg, Left upper leg, Face, Right lower leg, Left lower leg   Body parts bathed by helper: Right arm, Left arm     Bathing assist Assist Level: Minimal Assistance - Patient > 75%     Upper Body Dressing/Undressing Upper body dressing   What is the patient wearing?: Pull over shirt, Button up shirt    Upper body assist Assist Level: Maximal Assistance - Patient 25 - 49%    Lower Body Dressing/Undressing Lower body dressing      What is the patient wearing?: Pants     Lower body assist Assist for lower body dressing: Moderate Assistance - Patient 50 - 74%     Toileting Toileting    Toileting assist Assist for toileting: Maximal Assistance - Patient 25 - 49%     Transfers Chair/bed transfer  Transfers assist     Chair/bed transfer assist level: Contact Guard/Touching assist     Locomotion Ambulation   Ambulation assist      Assist level: Contact Guard/Touching assist Assistive device: No Device Max distance: 125 ft   Walk 10 feet activity   Assist     Assist level: Contact Guard/Touching assist Assistive device: No Device   Walk 50 feet activity   Assist    Assist level: Contact Guard/Touching assist Assistive device: No Device    Walk 150 feet activity   Assist Walk 150 feet activity did not occur: Safety/medical concerns         Walk 10 feet on uneven surface  activity   Assist Walk 10 feet on uneven surfaces activity did not occur: Safety/medical  concerns         Wheelchair     Assist Is the patient using a wheelchair?: No             Wheelchair 50 feet with 2 turns activity    Assist            Wheelchair 150 feet activity     Assist          Blood pressure (!) 156/79, pulse 74, temperature 97.8 F (36.6 C), temperature source Oral, resp. rate 16, height 5' 6.5" (1.689 m), weight 98.1 kg, SpO2 100 %.  Medical Problem List and Plan: 1. 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             -patient may not shower             -ELOS/Goals: dc to ortho tomorrow for TSA  -Continue CIR therapies including PT, OT, and SLP  2.  Antithrombotics: -DVT/anticoagulation:  Pharmaceutical: Lovenox             -antiplatelet therapy: N/a 3. Pain Management: N/A 4. Mood/Behavior/Sleep: .              -antipsychotic agents: N/A  -pt dealing with a lot of adjustment anxiety.   -has hx of anxiety and depression--on cymbalta  -added xanax prn    -Dr. Kieth Brightly input appreciated!  -team providing pt emotional support as possible 5. Neuropsych/cognition: This patient is capable of making decisions on her own behalf. 6. Skin/Wound Care: Monitor wound for healing. Routine pressure relief measures.  7. Fluids/Electrolytes/Nutrition: Monitor I/O. Check CMET in am.              -- recent lab work is within normal limits.  8. Right elbow Fx/dislocation: Per ortho: Nonweightbearing right upper extremity             Okay to move digits and wrist as tolerated             No lifting with right upper extremity              ex fix x 4-6 weeks   -ortho updated wound care orders. Pin sites look good   - made pt NPO AM of 9/28 for R shoulder surgery- waiting ot hear timing 9. Left distal radius/ulna Fx s/p ORIF 9/14: Nonweightbearing to left wrist. Maintain splint.             Okay to use left hand  for basic ADLs and to feed self             No axial loading of the left  wrist             Ice and elevate             Digit motion as tolerated--moves well             No motion restrictions to elbow and shoulder  10. Right proximal humerus Fx:  Nonweightbearing right upper extremity             Okay to move digits and wrist as tolerated             No lifting with right upper extremity  --Plans for total shoulder 09/28 at [facility] by Dr. Stann Mainland.  F/u with ortho re: logistics of tomorrow 11. Sundowning/Insomnia: Will start Sleep-wake chart to monitor for disruption             -seemed to have a better night. Continue to observe.  12. Tachycardia: Heart rate up to 140's with activity likely due to deconditioning             --monitor for symptoms with activity.  13. Acute blood loss anemia: Monitor H/H for stability. Trending up from 7.8-->8.6 today.  --Macrocytosis-->check anemia panel/folic acid.  --hgb stable at 8.6 9/22 14. Thrombocytopenia/Neutropenia: Has resolved--likely reactive.  H/o of thrombocytopenia in the past.             --wbcs and plts stable to improved 15. H/o Tremors:  used inderal bid at home-->resumed --no active tremors    17.  Metabolic bone disease:  borderline low @ 30.91. started pt on ergocalciferol --on vitamin D supplement.     LOS: 6 days A FACE TO FACE EVALUATION WAS PERFORMED  Meredith Staggers 09/09/2022, 8:30 AM

## 2022-09-09 NOTE — Progress Notes (Addendum)
Patient ID: Vanessa [name], female   DOB: [birth year], 71 y.o.   MRN: [id number]  SW spoke with Lauren/[facility] to schedule transportation for surgery. Pick up will be at 9:15am tomorrow.   SW ordered 3in1 BSC with [employer] via parachute.   Loralee Pacas, MSW, [place] Office: [phone] Cell: [phone] Fax: [phone]

## 2022-09-09 NOTE — Progress Notes (Signed)
Physical Therapy Discharge Summary  Patient Details  Name: Vanessa [name] MRN: [id number] Date of Birth: Sep 19, 1951  Date of Discharge from PT service:September 09, 2022  Today's Date: 09/09/2022   Patient has met 9 of 9 long term goals due to improved activity tolerance, improved balance, improved postural control, increased strength, decreased pain, ability to compensate for deficits, improved attention, improved awareness, and improved coordination.  Patient to discharge at an ambulatory level Supervision.   Patient's care partner is independent to provide the necessary physical assistance at discharge.  Reasons goals not met: All PT goals met at this time.  Recommendation:  Patient will benefit from ongoing skilled PT services in outpatient setting to continue to advance safe functional mobility, address ongoing impairments in balance, activity tolerance, functional mobility, strength/ROM, patient/caregiver education, and minimize fall risk.  Equipment: No equipment provided  Reasons for discharge: treatment goals met  Patient/family agrees with progress made and goals achieved: Yes  PT Discharge Precautions/Restrictions Precautions Precautions: Fall Precaution Comments: external fixator R UE. RUE NWB. LUE no wrist weightbearing. Ok to move fingers bilaterally. Ok to use LUE for ADLs/self feeding Splint/Cast: external fixator R UE; splint L UE Restrictions Weight Bearing Restrictions: Yes RUE Weight Bearing: Non weight bearing LUE Weight Bearing: Weight bear through elbow only Other Position/Activity Restrictions: LUE: permitted to use L hand for basic ADLs and feed self; L UE digit motion as tolerated, no ROM restrictions to L elbow and shoulder. RUE: no lifting with R UE; permitted to move R digits and wrist as tolerated Pain Interference Pain Interference Pain Effect on Sleep: 3. Frequently Pain Interference with Therapy Activities: 1. Rarely or not at all Pain  Interference with Day-to-Day Activities: 1. Rarely or not at all Vision/Perception  Vision - History Ability to See in Adequate Light: 0 Adequate Vision - Assessment Eye Alignment: Within Functional Limits Ocular Range of Motion: Within Functional Limits Alignment/Gaze Preference: Within Defined Limits Tracking/Visual Pursuits: Able to track stimulus in all quads without difficulty Saccades: Decreased speed of saccadic movement;Additional head turns occurred during testing Convergence: Impaired (comment) (8 cm) Perception Perception: Within Functional Limits Praxis Praxis: Intact  Cognition Overall Cognitive Status: Impaired/Different from baseline Arousal/Alertness: Awake/alert Orientation Level: Oriented X4 Focused Attention: Appears intact Sustained Attention: Appears intact Selective Attention: Appears intact Memory: Impaired Memory Impairment: Decreased recall of new information;Decreased short term memory Awareness: Impaired Problem Solving: Impaired Safety/Judgment: Appears intact [employer] Scales of Cognitive Functioning: Purposeful, Appropriate Sensation Sensation Hot/Cold: Appears Intact Proprioception: Appears Intact (limited assessment of upper extremities due to injuries, overall WFL within mobility restrictions) Stereognosis: Not tested Coordination Gross Motor Movements are Fluid and Coordinated: No Fine Motor Movements are Fluid and Coordinated: No Coordination and Movement Description: BUE weightbearing and AROM restrictions Heel Shin Test: WNL Motor  Motor Motor: Abnormal postural alignment and control Motor - Discharge Observations: reduced postural control without use of reaching strategies due to fx's and generalized weakness of lower extremities proximal>distal  Mobility Bed Mobility Supine to Sit: Supervision/Verbal cueing (with elevated HOB, will sleep in recliner at home) Sit to Supine: Supervision/Verbal cueing (with elevated HOB, will  sleep in recliner at home) Transfers Sit to Stand: Supervision/Verbal cueing Stand to Sit: Supervision/Verbal cueing Stand Pivot Transfers: Supervision/Verbal cueing Transfer (Assistive device): None Locomotion  Gait Ambulation: Yes Gait Assistance: Supervision/Verbal cueing Gait Distance (Feet): 974 Feet (during 6MWT) Assistive device: None Gait Gait: Yes Gait Pattern: Step-through pattern;Wide base of support;Lateral hip instability;Decreased trunk rotation;Decreased stride length (hesitant stepping) Gait velocity: decreased Stairs /  Additional Locomotion Stairs: Yes Stairs Assistance: Contact Guard/Touching assist Stair Management Technique: No rails Number of Stairs: 8 Height of Stairs: 6 Ramp: Contact Guard/touching assist Curb: Contact Guard/Touching assist Wheelchair Mobility Wheelchair Mobility: No  Trunk/Postural Assessment  Cervical Assessment Cervical Assessment: Within Functional Limits Thoracic Assessment Thoracic Assessment: Within Functional Limits Lumbar Assessment Lumbar Assessment: Within Functional Limits Postural Control Postural Control: Deficits on evaluation Righting Reactions: delayed  Balance Berg Balance Test Sit to Stand: Able to stand without using hands and stabilize independently Standing Unsupported: Able to stand safely 2 minutes Sitting with Back Unsupported but Feet Supported on Floor or Stool: Able to sit safely and securely 2 minutes Stand to Sit: Sits safely with minimal use of hands Transfers: Able to transfer safely, minor use of hands Standing Unsupported with Eyes Closed: Able to stand 10 seconds safely Standing Ubsupported with Feet Together: Able to place feet together independently and stand 1 minute safely From Standing, Reach Forward with Outstretched Arm: Can reach confidently >25 cm (10") From Standing Position, Pick up Object from Floor: Able to pick up shoe safely and easily From Standing Position, Turn to Look Behind  Over each Shoulder: Looks behind from both sides and weight shifts well Turn 360 Degrees: Able to turn 360 degrees safely but slowly Standing Unsupported, Alternately Place Feet on Step/Stool: Able to complete >2 steps/needs minimal assist Standing Unsupported, One Foot in Front: Able to plae foot ahead of the other independently and hold 30 seconds Standing on One Leg: Tries to lift leg/unable to hold 3 seconds but remains standing independently Total Score: 47 Static Sitting Balance Static Sitting - Level of Assistance: 7: Independent Dynamic Sitting Balance Dynamic Sitting - Level of Assistance: 7: Independent Static Standing Balance Static Standing - Level of Assistance: 7: Independent Dynamic Standing Balance Dynamic Standing - Level of Assistance: 5: Stand by assistance Functional Gait  Assessment Gait Level Surface: Walks 20 ft in less than 7 sec but greater than 5.5 sec, uses assistive device, slower speed, mild gait deviations, or deviates 6-10 in outside of the 12 in walkway width. Change in Gait Speed: Able to change speed, demonstrates mild gait deviations, deviates 6-10 in outside of the 12 in walkway width, or no gait deviations, unable to achieve a major change in velocity, or uses a change in velocity, or uses an assistive device. Gait with Horizontal Head Turns: Performs head turns smoothly with slight change in gait velocity (eg, minor disruption to smooth gait path), deviates 6-10 in outside 12 in walkway width, or uses an assistive device. Gait with Vertical Head Turns: Performs task with moderate change in gait velocity, slows down, deviates 10-15 in outside 12 in walkway width but recovers, can continue to walk. Gait and Pivot Turn: Pivot turns safely in greater than 3 sec and stops with no loss of balance, or pivot turns safely within 3 sec and stops with mild imbalance, requires small steps to catch balance. Step Over Obstacle: Is able to step over one shoe box (4.5 in  total height) but must slow down and adjust steps to clear box safely. May require verbal cueing. Gait with Narrow Base of Support: Ambulates less than 4 steps heel to toe or cannot perform without assistance. Gait with Eyes Closed: Walks 20 ft, uses assistive device, slower speed, mild gait deviations, deviates 6-10 in outside 12 in walkway width. Ambulates 20 ft in less than 9 sec but greater than 7 sec. Ambulating Backwards: Walks 20 ft, uses assistive device, slower speed, mild  gait deviations, deviates 6-10 in outside 12 in walkway width. Steps: Two feet to a stair, must use rail. (unable to use rail due to UE weight bearing restrictions, CGA support on L elbow required for steadying support) Total Score: 15 Extremity Assessment  RLE Assessment RLE Assessment: Within Functional Limits General Strength Comments: grossly 4+ to 5/5 proximal to distal LLE Assessment LLE Assessment: Within Functional Limits General Strength Comments: grossly 4+ to 5/5 proximal to distal   Daymond Cordts L Larosa Rhines PT, DPT, NCS, CBIS  09/09/2022, 4:31 PM

## 2022-09-09 NOTE — Progress Notes (Signed)
Inpatient Rehabilitation Discharge Medication Review by a Pharmacist  A complete drug regimen review was completed for this patient to identify any potential clinically significant medication issues.  High Risk Drug Classes Is patient taking? Indication by Medication  Antipsychotic No   Anticoagulant Yes Lovenox - VTE ppx  Antibiotic No   Opioid No   Antiplatelet No   Hypoglycemics/insulin No   Vasoactive Medication Yes Losartan, propranolol - BP  Chemotherapy No   Other Yes Alprazolam prn anxiety Albuterol prn SOB Atorvastatin - HLD Duloxetine, gabapentin - pain Levothyroxine - low thryoid Methocarbamol - spasms Pantoprazole - reflux Zaleplon - sleep     Type of Medication Issue Identified Description of Issue Recommendation(s)  Drug Interaction(s) (clinically significant)     Duplicate Therapy     Allergy     No Medication Administration End Date     Incorrect Dose     Additional Drug Therapy Needed     Significant med changes from prior encounter (inform family/care partners about these prior to discharge).    Other       Clinically significant medication issues were identified that warrant physician communication and completion of prescribed/recommended actions by midnight of the next day:  No  Pharmacist comments: Going to [facility] for shoulder surgery  Time spent performing this drug regimen review (minutes):  30 minutes   Tad Moore 09/09/2022 2:52 PM

## 2022-09-09 NOTE — Plan of Care (Signed)
  Problem: RH Problem Solving Goal: LTG Patient will demonstrate problem solving for (SLP) Description: LTG:  Patient will demonstrate problem solving for basic/complex daily situations with cues  (SLP) Outcome: Completed/Met   Problem: RH Memory Goal: LTG Patient will demonstrate ability for day to day (SLP) Description: LTG:   Patient will demonstrate ability for day to day recall/carryover during cognitive/linguistic activities with assist  (SLP) Outcome: Completed/Met   Problem: RH Attention Goal: LTG Patient will demonstrate this level of attention during functional activites (SLP) Description: LTG:  Patient will will demonstrate this level of attention during functional activites (SLP) Outcome: Completed/Met   Problem: RH Awareness Goal: LTG: Patient will demonstrate awareness during functional activites type of (SLP) Description: LTG: Patient will demonstrate awareness during functional activites type of (SLP) Outcome: Completed/Met

## 2022-09-09 NOTE — Progress Notes (Addendum)
Physical Therapy TBI Note  Patient Details  Name: Vanessa [name] MRN: [id number] Date of Birth: 11-18-1951  Today's Date: 09/09/2022 PT Individual Time: 0805-0902 and 1300-1330 PT Individual Time Calculation (min): 57 min and 30 min  Short Term Goals: Week 1:  PT Short Term Goal 1 (Week 1): Pt will perform all bed mobility with consistent supervision. PT Short Term Goal 2 (Week 1): Pt will perform all standing transfers with consistent CGA. PT Short Term Goal 3 (Week 1): Pt will ambulate >200 ft with close supervision/ CGA and maintain HR<120 bpm. PT Short Term Goal 4 (Week 1): Pt will initiate stair training.  Skilled Therapeutic Interventions/Progress Updates:     Session 1: Patient in bed with LPN providing morning medications upon PT arrival. Patient alert and agreeable to PT session. Patient reported 7/10 R arm pain during session, LPN made aware and provided Tylenol. PT provided repositioning, rest breaks, and distraction as pain interventions throughout session.   Focused session on d/c assessment, see d/c note for details, followed by household mobility.   Therapeutic Activity: Bed Mobility: Patient performed supine to/from sit with supervision-mod I with HOB elevated, patient to sleep in a lift recliner at home. Provided cue for not L wrist weight bearing x1. Transfers: Patient performed sit to/from stand with supervision-modified independence for increased time for scooting forward and foot placement from hospital bed at lowest height and hospital recliner with supervision without use of upper extremities throughout session. Patient able to teach back strategies for improved technique from previous sessions. She performed ambulatory transfers throughout the room with distant supervision without LOB during session.  Therapeutic Exercise: Patient performed the following exercises using HEP handout with min verbal and tactile cues for proper technique. HEP Access Code:  [email or web address] - Sit to Stand with Arms Crossed  - 2 x daily - 7 x weekly - 2 sets - 10 reps - Seated March  - 2 x daily - 7 x weekly - 2 sets - 10 reps - Mini Squat  - 2 x daily - 7 x weekly - 2 sets - 10 reps - Seated Hip Abduction with Resistance  - 2 x daily - 7 x weekly - 2 sets - 10 reps  Patient in recliner at end of session with breaks locked and all needs within reach.   Session 2: Patient in bed with her husband in the room upon PT arrival. Patient alert and agreeable to PT session. Patient denied pain during session.  Patient's husband present for family education and hands on training throughout session. Performed safe guarding with all mobility following PT cues and/or demonstration. Educated on fall risk/prevention, home modifications to prevent falls, and activation of emergency services in the event of a fall during session.   Therapeutic Activity: Bed Mobility: Patient performed supine to/from sit with supervision-mod I with HOB elevated, patient to sleep in a lift recliner at home.  Transfers: Patient performed sit to/from stand x3 with CGA-supervision without AD or use of upper extremities. Provided verbal cues for forward weight shift. Patient performed a simulated SUV height car transfer with supervision without AD. Provided cues for safe technique, maintained weight bearing precautions without cues.  Gait Training:  Patient ambulated 100-200 feet x3  without AD with close supervision. Ambulated with decreased gait speed, decreased step length and height, increased lateral trunk sway with trendelenburg gait, and forward trunk lean. Provided verbal cues for increased gait speed for improved balance without AD and increased lateral hip activation in stance. Patient  ascended/descended 4x6" steps without rails with CGA. Performed step-to gait pattern throughout. Provided cues for guarding technique.  Patient ambulated up/down a ramp, over 10 feet of mulch (unlevel surface), and  up/down a curb to simulate community ambulation over unlevel surfaces with CGA.   Educated on [employer] of 5 min 2x per day and to progress by 1 min each week until ambulating 15 min 2x per day.  Patient in bed with her husband at bedside at end of session with breaks locked, bed alarm set, and all needs within reach.   Therapy Documentation Precautions:  Precautions Precautions: Fall Precaution Comments: external fixator R UE. RUE NWB. LUE no wrist weightbearing. Ok to move fingers bilaterally. Ok to use LUE for ADLs/self feeding Required Braces or Orthoses: Splint/Cast Splint/Cast: external fixator R UE; splint L UE Splint/Cast - Date Prophylactic Dressing Applied (if applicable): 08/25/22 Restrictions Weight Bearing Restrictions: Yes RUE Weight Bearing: Non weight bearing LUE Weight Bearing: Weight bear through elbow only Other Position/Activity Restrictions: LUE: permitted to use L hand for basic ADLs and feed self; L UE digit motion as tolerated, no ROM restrictions to L elbow and shoulder. RUE: no lifting with R UE; permitted to move R digits and wrist as tolerated    Therapy/Group: Individual Therapy  Aleighna Wojtas L Scott Fix PT, DPT, NCS, CBIS  09/09/2022, 12:51 PM

## 2022-09-09 NOTE — Progress Notes (Signed)
Occupational Therapy Discharge Summary  Patient Details  Name: Vanessa [name] MRN: [id number] Date of Birth: 08/11/1951   Today's Date: 09/09/2022 OT Individual Time: 1400-1430 OT Individual Time Calculation (min): 30 min   Pt received in room standing at sink with NT with no c/o pain . Pt very excited to report toileting with supervision and completing steps without A. ADL: Pt completes ADL at overall UB dressing at EOB, grooming at sink, and toileting (2x) at ambulatory Level. Skilled interventions include: provision/education on use of elastic laces for shoes to eliminate need to fasten shoe strings, increased time for pt to self organize for toileting and problemsolve use of toilet tongs, and sequencing for UB doff/donning pull over shirt.   Therapeutic activity Functional mobility to/from ortho gym with pt able to recall path back to room with supervision and minmally increased lateral sway but no LOB especially with increased speed of steps. Pt completes twister trail making B test with 2 instances of needing cuing, one question and one direct cue for correction of error in the sequence.   Pt left at end of session in toilet with NT aware of positioning heading to room to finish supervising toileting, call light in reach and all needs met  Today's Date: 09/09/2022 OT Individual Time: 1400-1430 OT Individual Time Calculation (min): 30 min  Family ED: Pt received in bed with husband present. No pain. OT educates on transfers, dressing sequencing, toileting, adaptive equipment (toileti tongs), elastic shoe laces and bathing techniques. Husband with many questions but able ot teach back sequence and reporting using similar strategies previously for personal use. Educated on set up of BSC at toilet height and then also provided education if walk in shower not finished prior to pt medically cleared to shower then family should consider purchasing TTB to decrease risk of falling when getting  in and out of tub. All verbalized understanding. Reiterated pt is at supervision -MIN A level therefore pt should do ADLs with as little help as possible to promote independence and cardiovascular conditioning. Energy conservation reviewed with no questions. Pt left at end of session in bed with exit alarm on, call light in reach and all needs met   Date of Discharge from OT service:September 09, 2022 Patient has met 8 of 8 long term goals due to improved activity tolerance, improved balance, postural control, ability to compensate for deficits, improved attention, and improved awareness.  Patient to discharge at [facility] Assist level.  Patient's care partner is independent to provide the necessary physical and cognitive assistance at discharge.  Pt has made excellent progress with adaptive strategies to improve independence within precautions. Pt husband has been education on how to assist as needed with ADLs as stated above. Pt very proud of progress and happy to teach back techniques to anyone who is assisting her.  Reasons goals not met: n/a  Recommendation:  Patient will benefit from ongoing skilled OT services in OP setting after sx  Equipment: BSC  Reasons for discharge: treatment goals met and discharge from hospital  Patient/family agrees with progress made and goals achieved: Yes  OT Discharge Precautions/Restrictions  Restrictions Weight Bearing Restrictions: Yes RUE Weight Bearing: Non weight bearing LUE Weight Bearing: Weight bear through elbow only   ADL ADL Eating: Independent Where Assessed-Eating: Bed level Grooming: Independent Where Assessed-Grooming: Sitting at sink Upper Body Bathing: Supervision/safety Where Assessed-Upper Body Bathing: Chair Lower Body Bathing: Supervision/safety Where Assessed-Lower Body Bathing: Chair Upper Body Dressing: Contact guard Where Assessed-Upper Body Dressing:  Standing at sink Lower Body Dressing: Supervision/safety Where  Assessed-Lower Body Dressing: Standing at sink, Sitting at sink Toileting: Supervision/safety Where Assessed-Toileting: Glass blower/designer: Close supervision Toilet Transfer Method: Counselling psychologist: Radiographer, therapeutic: Unable to assess  Cognition Cognition Overall Cognitive Status: Impaired/Different from baseline Arousal/Alertness: Awake/alert Orientation Level: Person;Place;Situation Person: Oriented Place: Oriented Situation: Oriented Focused Attention: Appears intact Sustained Attention: Appears intact Selective Attention: Appears intact Awareness: Impaired Problem Solving: Impaired Organizing: Impaired Safety/Judgment: Appears intact [employer] Scales of Cognitive Functioning: Purposeful, Appropriate Brief Interview for Mental Status (BIMS) Repetition of Three Words (First Attempt): 3 Temporal Orientation: Year: Correct Temporal Orientation: Month: Accurate within 5 days Temporal Orientation: Day: Correct Recall: "Sock": Yes, no cue required Recall: "Blue": Yes, no cue required Recall: "Bed": No, could not recall BIMS Summary Score: 13 Vision/Perception  Vision - History Ability to See in Adequate Light: 0 Adequate Vision - Assessment Eye Alignment: Within Functional Limits Ocular Range of Motion: Within Functional Limits Alignment/Gaze Preference: Within Defined Limits Tracking/Visual Pursuits: Able to track stimulus in all quads without difficulty Saccades: Decreased speed of saccadic movement;Additional head turns occurred during testing Convergence: Impaired (comment) (8 cm) Perception Perception: Within Functional Limits Praxis Praxis: Intact  Sensation Sensation Hot/Cold: Appears Intact Proprioception: Appears Intact (limited assessment of upper extremities due to injuries, overall WFL within mobility restrictions) Stereognosis: Not tested Coordination Gross Motor Movements are Fluid and Coordinated:  No Fine Motor Movements are Fluid and Coordinated: No Coordination and Movement Description: BUE weightbearing and AROM restrictions Heel Shin Test: WNL Motor  Motor Motor: Abnormal postural alignment and control Motor - Discharge Observations: reduced postural control without use of reaching strategies due to fx's and generalized weakness of lower extremities proximal>distal  Mobility Bed Mobility Supine to Sit: Supervision/Verbal cueing (with elevated HOB, will sleep in recliner at home) Sit to Supine: Supervision/Verbal cueing (with elevated HOB, will sleep in recliner at home) Transfers Sit to Stand: Supervision/Verbal cueing Stand to Sit: Supervision/Verbal cueing Stand Pivot Transfers: Supervision/Verbal cueing Transfer (Assistive device): None Trunk/Postural Assessment  Cervical Assessment Cervical Assessment: Within Functional Limits Thoracic Assessment Thoracic Assessment: Within Functional Limits Lumbar Assessment Lumbar Assessment: Within Functional Limits Postural Control Postural Control: Deficits on evaluation Righting Reactions: delayed  Balance Berg Balance Test Sit to Stand: Able to stand without using hands and stabilize independently Standing Unsupported: Able to stand safely 2 minutes Sitting with Back Unsupported but Feet Supported on Floor or Stool: Able to sit safely and securely 2 minutes Stand to Sit: Sits safely with minimal use of hands Transfers: Able to transfer safely, minor use of hands Standing Unsupported with Eyes Closed: Able to stand 10 seconds safely Standing Ubsupported with Feet Together: Able to place feet together independently and stand 1 minute safely From Standing, Reach Forward with Outstretched Arm: Can reach confidently >25 cm (10") From Standing Position, Pick up Object from Floor: Able to pick up shoe safely and easily From Standing Position, Turn to Look Behind Over each Shoulder: Looks behind from both sides and weight shifts  well Turn 360 Degrees: Able to turn 360 degrees safely but slowly Standing Unsupported, Alternately Place Feet on Step/Stool: Able to complete >2 steps/needs minimal assist Standing Unsupported, One Foot in Front: Able to plae foot ahead of the other independently and hold 30 seconds Standing on One Leg: Tries to lift leg/unable to hold 3 seconds but remains standing independently Total Score: 47 Static Sitting Balance Static Sitting - Level of Assistance: 7: Independent Dynamic Sitting Balance Dynamic  Sitting - Level of Assistance: 7: Independent Static Standing Balance Static Standing - Level of Assistance: 7: Independent Dynamic Standing Balance Dynamic Standing - Level of Assistance: 5: Stand by assistance Functional Gait  Assessment Gait Level Surface: Walks 20 ft in less than 7 sec but greater than 5.5 sec, uses assistive device, slower speed, mild gait deviations, or deviates 6-10 in outside of the 12 in walkway width. Change in Gait Speed: Able to change speed, demonstrates mild gait deviations, deviates 6-10 in outside of the 12 in walkway width, or no gait deviations, unable to achieve a major change in velocity, or uses a change in velocity, or uses an assistive device. Gait with Horizontal Head Turns: Performs head turns smoothly with slight change in gait velocity (eg, minor disruption to smooth gait path), deviates 6-10 in outside 12 in walkway width, or uses an assistive device. Gait with Vertical Head Turns: Performs task with moderate change in gait velocity, slows down, deviates 10-15 in outside 12 in walkway width but recovers, can continue to walk. Gait and Pivot Turn: Pivot turns safely in greater than 3 sec and stops with no loss of balance, or pivot turns safely within 3 sec and stops with mild imbalance, requires small steps to catch balance. Step Over Obstacle: Is able to step over one shoe box (4.5 in total height) but must slow down and adjust steps to clear box safely.  May require verbal cueing. Gait with Narrow Base of Support: Ambulates less than 4 steps heel to toe or cannot perform without assistance. Gait with Eyes Closed: Walks 20 ft, uses assistive device, slower speed, mild gait deviations, deviates 6-10 in outside 12 in walkway width. Ambulates 20 ft in less than 9 sec but greater than 7 sec. Ambulating Backwards: Walks 20 ft, uses assistive device, slower speed, mild gait deviations, deviates 6-10 in outside 12 in walkway width. Steps: Two feet to a stair, must use rail. (unable to use rail due to UE weight bearing restrictions, CGA support on L elbow required for steadying support) Total Score: 15 Extremity/Trunk Assessment   WB and ROM restrictions for BUE  Right elbow Fx/dislocation: Per ortho: Nonweightbearing right upper extremity              Okay to move digits and wrist as tolerated              No lifting with right upper extremity               ex fix x 4-6 weeks   9. Left distal radius/ulna Fx s/p ORIF 9/14: Nonweightbearing to left wrist. Maintain splint.              Okay to use left hand for basic ADLs and to feed self              No axial loading of the left wrist              Ice and elevate              Digit motion as tolerated              No motion restrictions to elbow and shoulder     Tonny Branch 09/09/2022, 2:42 PM

## 2022-09-09 NOTE — Discharge Summary (Signed)
Physician Discharge Summary  Patient ID: PARUL PORCELLI MRN: [id number] DOB/AGE: 06/13/1951 71 y.o.  Admit date: 09/03/2022 Discharge date: 09/09/2022  Discharge Diagnoses:  Principal Problem:   Traumatic brain injury with loss of consciousness of 30 minutes or less (HCC) Active Problems:   Hypertension   Depression   Insomnia   Thrombocytopenia (HCC)   Trauma   Acute blood loss anemia   Radius/ulna fracture, left, sequela   Closed comminuted fracture of right humerus   Discharged Condition: stable  Significant Diagnostic Studies: N/a   Labs:  Basic Metabolic Panel:    Latest Ref Rng & Units 09/07/2022    3:59 AM 09/04/2022    3:17 AM 08/31/2022    6:50 AM  BMP  Glucose 70 - 99 mg/dL 097  353  299   BUN 8 - 23 mg/dL 14  15  12    Creatinine 0.44 - 1.00 mg/dL  2.42  6.83   Sodium 135 - 145 mmol/L 139  139  140   Potassium 3.5 - 5.1 mmol/L 3.7  3.6  3.6   Chloride 98 - 111 mmol/L 104  107  110   CO2 22 - 32 mmol/L 25  27  25    Calcium 8.9 - 10.3 mg/dL 9.0  8.6  8.3      CBC: Recent Labs  Lab 09/03/22 0550 09/04/22 0317 09/07/22 0359  WBC 4.2 5.2 5.2  NEUTROABS  --  3.2  --   HGB 8.6* 8.6* 9.4*  HCT 27.8* 26.6* 29.3*  MCV 102.2* 102.7* 102.1*  PLT 167 170 198    CBG: No results for input(s): "GLUCAP" in the last 168 hours.  Brief HPI:   Vanessa [name] is a 71 y.o. female with history of HTN, tremors, RLS who was admitted on 08/25/22 after fall down 12 stairs with resultant SDH/SAH, displaced angulated right humeral head Fx, right radius fracture dislocation, displaced fractures of left radius ulna with radiocarpal joint involvement. Right elbow reduced and BUE treated with splinting. She was taken to OR on 08/27/22 for ORIF of left distal radius and ulna fx and right radial head arthroplasty  and right radial head arthroplasty with right elbow reconstruction and external fixator placement by Dr. 10/25/22 on 09/14. Post op NWB RUE and left wrist with plans  for right shoulder arthroplasty by Dr. Carola Frost in a week. Follow up CT of RUE hardware showed stability and fixator to stay in place for 4-6 weeks. Post op course significant for ABLA, sleep wake disruption as well as anxiety with pain. PT/OT has been working with patient and CIR recommended due to functional decline.    Hospital Course: Vanessa [name] was admitted to rehab 09/03/2022 for inpatient therapies to consist of PT, ST and OT at least three hours five days a week. Past admission physiatrist, therapy team and rehab RN have worked together to provide customized collaborative inpatient rehab. She was maintained on SQ lovenox for DVT prophylaxis.  Blood pressures were monitored on TID basis and has been stable.  Inderal was resumed and tachycardia has resolved with improvement in activity tolerance.  Ergocalciferol was added due to borderline low Vitamin D level. Follow up CBC showed ABLA is improving and thrombocytopenia has resolved. Follow up BMET showed lytes and renal status to be WNL. Mood has been stable on Cymbalta and Dr. [name] has worked with patient on coping skills. Her sleep wake disruption has resolved and anxiety has improved with ego support provided by the team.  Rehab course: During patient's stay in rehab weekly team conferences were held to monitor patient's progress, set goals and discuss barriers to discharge. At admission, patient required mod assist with ADL tasks and min assist with mobility.  She exhibited mild cognitive deficits with SLUMS score 15/26. She  has had improvement in activity tolerance, balance, postural control as well as ability to compensate for deficits. She is able to complete ADL tasks with min assist level. She is able to complete functional and mildly complex tasks at modified independent to supervision level. She requires supervision for transfers and to ambulate 974 feed without AD. Family education has been completed.    Disposition:  Acute  hospital for surgery  Diet: Regular  Pin care: Daily, remove any crust or coagulum that may obstruct drainage with saline moistened guaze or soap and water. Dress pins daily with dry Kerlix roll. Wrap the Kerlix so that it tamps the skin down around the pin --skin interface to prevent motion of the skin relative to the pin.  Special Instructions: No weight on left hand. May use hand for feeding/ADLs No weight on RUE.   Discharge Instructions     Ambulatory referral to Physical Medicine Rehab   Complete by: As directed    Hospital follow up w/S[facility]      Allergies as of 09/09/2022       Reactions   Ace Inhibitors Cough        Medication List     STOP taking these medications    famotidine 40 MG tablet Commonly known as: PEPCID   OVER THE COUNTER MEDICATION       TAKE these medications    acetaminophen 325 MG tablet Commonly known as: TYLENOL Take 2 tablets (650 mg total) by mouth 4 (four) times daily -  with meals and at bedtime.   albuterol 108 (90 Base) MCG/ACT inhaler Commonly known as: VENTOLIN HFA Inhale 2 puffs into the lungs every 4 (four) hours as needed for wheezing (cough, shortness of breath or wheezing.).   ALPRAZolam 0.5 MG tablet Commonly known as: XANAX Take 1 tablet (0.5 mg total) by mouth 2 (two) times daily as needed for anxiety.   ascorbic acid 500 MG tablet Commonly known as: VITAMIN C Take 1 tablet (500 mg total) by mouth daily. Start taking on: September 10, 2022   atorvastatin 20 MG tablet Commonly known as: LIPITOR Take 20 mg by mouth daily.   docusate sodium 100 MG capsule Commonly known as: COLACE Take 1 capsule (100 mg total) by mouth 2 (two) times daily.   DULoxetine 60 MG capsule Commonly known as: CYMBALTA Take 60 mg by mouth daily.   ferrous sulfate 325 (65 FE) MG tablet Take 1 tablet (325 mg total) by mouth daily. Start taking on: September 10, 2022   gabapentin 300 MG capsule Commonly known as: NEURONTIN Take  600 mg by mouth at bedtime.   levothyroxine 50 MCG tablet Commonly known as: SYNTHROID Take 50 mcg by mouth daily.   losartan 100 MG tablet Commonly known as: COZAAR Take 100 mg by mouth daily.   melatonin 5 MG Tabs Take 1 tablet (5 mg total) by mouth at bedtime as needed.   Methocarbamol 1000 MG Tabs Take 1,000 mg by mouth every 8 (eight) hours.   Oxycodone HCl 10 MG Tabs--Rx # 21 pills Take 1 tablet (10 mg total) by mouth every 8 (six) hours as needed for severe pain.   pantoprazole 40 MG tablet Commonly known as: PROTONIX Take  1 tablet (40 mg total) by mouth daily. Start taking on: September 10, 2022   polyethylene glycol 17 g packet Commonly known as: MIRALAX / GLYCOLAX Take 17 g by mouth daily. Start taking on: September 10, 2022   propranolol 10 MG tablet Commonly known as: INDERAL Take 10 mg by mouth 2 (two) times daily.   SYSTANE OP Place 1-2 drops into both eyes daily as needed (Dry eyes).   trolamine salicylate 10 % cream Commonly known as: ASPERCREME Apply topically 2 (two) times daily as needed for muscle pain.   Vitamin D (Ergocalciferol) 1.25 MG (50000 UNIT) Caps capsule Commonly known as: DRISDOL Take 1 capsule (50,000 Units total) by mouth every 7 (seven) days. Start taking on: September 10, 2022   vitamin D3 25 MCG tablet Commonly known as: CHOLECALCIFEROL Take 1 tablet (1,000 Units total) by mouth daily. Start taking on: September 10, 2022   zaleplon 10 MG capsule Commonly known as: SONATA Take 10 mg by mouth at bedtime.        Follow-up Information     Nicholes Stairs, MD Follow up.   Specialty: Orthopedic Surgery Why: Call in 1-2 days for post hospital follow up Contact information: [address] [place] [address] [phone]         Meredith Staggers, MD Follow up.   Specialty: Physical Medicine and Rehabilitation Why: office will call you with follow up appointment Contact information: [address] [place] [address] [phone]                 Signed: Bary Leriche 09/09/2022, 6:50 PM

## 2022-09-09 NOTE — Progress Notes (Signed)
Contacted Dr. Erskine Squibb office for input on surgery and to inform them that she will be d/c from our service and will need to be admitted to their service as well as need for pre-op orders.  Was informed that surgery is tomorrow at 2 pm and they will reach out with other instructions.

## 2022-09-09 NOTE — Progress Notes (Signed)
Speech Language Pathology Discharge Summary  Patient Details  Name: Vanessa [name] MRN: [id number] Date of Birth: 05-28-1951  Date of Discharge from SLP service:September 09, 2022  Today's Date: 09/09/2022 SLP Individual Time: 1330-1400 SLP Individual Time Calculation (min): 30 min   Skilled Therapeutic Interventions:  Skilled treatment session focused on completion of family education with the patient and her husband. SLP facilitated session by providing education regarding strategies to utilize at home to maximize attention, functional problem solving, recall and overall safety. Both the patient and her husband verbalized understanding and all questions were answered. Handouts were also given to reinforce information. Patient left sitting EOB with husband present.   Patient has met 4 of 4 long term goals.  Patient to discharge at overall Supervision level.   Reasons goals not met: N/A   Clinical Impression/Discharge Summary: Patient haas made functional gains and has met 4 of 4 LTGs this admission. Currently, patient demonstrates behaviors consistent with a Rancho Level IX and is overall supervision-Mod I to complete functional and mildly complex tasks safely in regards to problem solving, recall, attention and awareness. Patient and family education is complete and patient will discharge home from acute after a planned surgery that is scheduled for 09/10/22. Patient and husband endorse patient is at her baseline level of cognitive functioning, therefore, skilled SLP f/u is not warranted at this time. However, both educated regarding importance of requesting referral from physician for SLP services if need arises at home. Both verbalized understanding.    Care Partner:  Caregiver Able to Provide Assistance: Yes  Type of Caregiver Assistance: Physical;Cognitive  Recommendation:  None      Equipment: N/A   Reasons for discharge: Treatment goals met (Discharge to acute for surgery)    Patient/Family Agrees with Progress Made and Goals Achieved: Yes    Janece Laidlaw, [place] 09/09/2022, 6:23 AM

## 2022-09-09 NOTE — Discharge Instructions (Addendum)
Inpatient Rehab Discharge Instructions  Vanessa [name] Discharge date and time:    Activities/Precautions/ Functional Status: Activity: no lifting, driving, or strenuous exercise till cleared by MD Diet: regular diet Wound Care: No weight on Right extremity or as instructed by Dr. Stann Mainland.   No weight on left wrist--can use left wrist for feeding and ADLs. Follow up with Dr. Marcelino Scot for further input.   Pin care: Daily, remove any crust or coagulum that may obstruct drainage with saline moistened guaze or soap and water. Dress pins daily with dry Kerlix roll. Wrap the Kerlix so that it tamps the skin down around the pin --skin interface to prevent motion of the skin relative to the pin. Contact Dr. Marcelino Scot if you develop any problems with your incision/wound--redness, swelling, increase in pain, drainage or if you develop fever or chills.     Functional status:  ___ No restrictions     ___ Walk up steps independently _X__ 24/7 supervision/assistance   ___ Walk up steps with assistance ___ Intermittent supervision/assistance  ___ Bathe/dress independently ___ Walk with walker     _X__ Bathe/dress with assistance ___ Walk Independently    ___ Shower independently ___ Walk with assistance    ___ Shower with assistance _X__ No alcohol     ___ Return to work/school ________   COMMUNITY REFERRALS UPON DISCHARGE:    THERAPIES SHOULD BE DELAYED UNTIL WEIGHT BEARING RESTRICTIONS ARE LIFTED. BE SURE TO FOLLOW-UP WITH [facility]TH[facility] ABOUT THIS STATUS AND REQUEST AN ORDER FROM [facility] OR [facility]AB [facility]PATIENT [facility] [phone])TO GET ASSISTANCE. PLEASE USE HEP PROVIDED UNTIL THERAPIES BEGIN.   Medical Equipment/Items Ordered:3in1 bedside commode                                                 Agency/Supplier:[facility] [employer] [phone]    Special Instructions:    My questions have been answered and I understand these instructions. I will adhere to these goals and the provided educational materials  after my discharge from the hospital.  Patient/Caregiver Signature _______________________________ Date __________  Clinician Signature _______________________________________ Date __________  Please bring this form and your medication list with you to all your follow-up doctor's appointments.

## 2022-09-10 ENCOUNTER — Telehealth: Payer: Self-pay | Admitting: *Deleted

## 2022-09-10 ENCOUNTER — Encounter (HOSPITAL_COMMUNITY): Disposition: A | Discharge status: Home / Self Care | Payer: Self-pay | Attending: Orthopedic Surgery

## 2022-09-10 ENCOUNTER — Encounter (HOSPITAL_COMMUNITY): Payer: Self-pay | Admitting: Anesthesiology

## 2022-09-10 ENCOUNTER — Other Ambulatory Visit: Payer: Self-pay

## 2022-09-10 ENCOUNTER — Encounter (HOSPITAL_COMMUNITY)
Admission: RE | Disposition: A | Discharge status: Other Acute Inpatient Hospital | Payer: Self-pay | Source: Intra-hospital | Attending: Physical Medicine & Rehabilitation

## 2022-09-10 ENCOUNTER — Observation Stay (HOSPITAL_COMMUNITY)
Admit: 2022-09-10 | Discharge: 2022-09-11 | Disposition: A | Discharge status: Home / Self Care | Payer: Medicare Other | Attending: Orthopedic Surgery | Admitting: Orthopedic Surgery

## 2022-09-10 ENCOUNTER — Encounter (HOSPITAL_COMMUNITY): Payer: Self-pay | Admitting: Orthopedic Surgery

## 2022-09-10 ENCOUNTER — Inpatient Hospital Stay (HOSPITAL_COMMUNITY): Payer: Medicare Other | Admitting: Anesthesiology

## 2022-09-10 ENCOUNTER — Inpatient Hospital Stay (HOSPITAL_COMMUNITY): Payer: Medicare Other

## 2022-09-10 DIAGNOSIS — S42241A 4-part fracture of surgical neck of right humerus, initial encounter for closed fracture: Secondary | ICD-10-CM | POA: Diagnosis not present

## 2022-09-10 DIAGNOSIS — Z79899 Other long term (current) drug therapy: Secondary | ICD-10-CM | POA: Diagnosis not present

## 2022-09-10 DIAGNOSIS — I10 Essential (primary) hypertension: Secondary | ICD-10-CM

## 2022-09-10 DIAGNOSIS — W109XXA Fall (on) (from) unspecified stairs and steps, initial encounter: Secondary | ICD-10-CM | POA: Insufficient documentation

## 2022-09-10 DIAGNOSIS — Z8616 Personal history of COVID-19: Secondary | ICD-10-CM | POA: Insufficient documentation

## 2022-09-10 DIAGNOSIS — S42141A Displaced fracture of glenoid cavity of scapula, right shoulder, initial encounter for closed fracture: Secondary | ICD-10-CM | POA: Insufficient documentation

## 2022-09-10 DIAGNOSIS — S069X1S Unspecified intracranial injury with loss of consciousness of 30 minutes or less, sequela: Secondary | ICD-10-CM | POA: Diagnosis not present

## 2022-09-10 DIAGNOSIS — Z96611 Presence of right artificial shoulder joint: Principal | ICD-10-CM

## 2022-09-10 DIAGNOSIS — D62 Acute posthemorrhagic anemia: Secondary | ICD-10-CM | POA: Diagnosis not present

## 2022-09-10 HISTORY — PX: REVERSE SHOULDER ARTHROPLASTY: SHX5054

## 2022-09-10 LAB — CBC
HCT: 31.9 % — ABNORMAL LOW (ref 36.0–46.0)
Hemoglobin: 10.2 g/dL — ABNORMAL LOW (ref 12.0–15.0)
MCH: 32.6 pg (ref 26.0–34.0)
MCHC: 32 g/dL (ref 30.0–36.0)
MCV: 101.9 fL — ABNORMAL HIGH (ref 80.0–100.0)
Platelets: 185 10*3/uL (ref 150–400)
RBC: 3.13 MIL/uL — ABNORMAL LOW (ref 3.87–5.11)
RDW: 17.3 % — ABNORMAL HIGH (ref 11.5–15.5)
WBC: 5.5 10*3/uL (ref 4.0–10.5)
nRBC: 0 % (ref 0.0–0.2)

## 2022-09-10 LAB — CREATININE, SERUM
Creatinine, Ser: 0.83 mg/dL (ref 0.44–1.00)
GFR, Estimated: >60 mL/min (ref >60)

## 2022-09-10 SURGERY — ARTHROPLASTY, SHOULDER, TOTAL, REVERSE
Anesthesia: General

## 2022-09-10 SURGERY — ARTHROPLASTY, SHOULDER, TOTAL, REVERSE
Anesthesia: Regional | Site: Shoulder | Laterality: Right

## 2022-09-10 MED ORDER — TRANEXAMIC ACID-NACL 1000-0.7 MG/100ML-% IV SOLN: 1000.0000 mg | INTRAVENOUS | Status: DC

## 2022-09-10 MED ORDER — MELATONIN 5 MG PO TABS: 5.0000 mg | ORAL_TABLET | Freq: Every evening | ORAL | Status: DC | PRN

## 2022-09-10 MED ORDER — ONDANSETRON HCL 4 MG/2ML IJ SOLN: 4.0000 mg | Freq: Four times a day (QID) | INTRAMUSCULAR | Status: DC | PRN

## 2022-09-10 MED ORDER — OXYCODONE HCL 5 MG PO TABS
ORAL_TABLET | ORAL | Status: AC
  Filled 2022-09-10: qty 1

## 2022-09-10 MED ORDER — FERROUS SULFATE 325 (65 FE) MG PO TABS
325.0000 mg | ORAL_TABLET | Freq: Every day | ORAL | Status: DC
  Administered 2022-09-11: 325 mg via ORAL
  Filled 2022-09-10: qty 1

## 2022-09-10 MED ORDER — FENTANYL CITRATE (PF) 100 MCG/2ML IJ SOLN
INTRAMUSCULAR | Status: DC | PRN
  Administered 2022-09-10 (×2): 50 ug via INTRAVENOUS

## 2022-09-10 MED ORDER — ROCURONIUM BROMIDE 100 MG/10ML IV SOLN
INTRAVENOUS | Status: DC | PRN
  Administered 2022-09-10: 70 mg via INTRAVENOUS
  Administered 2022-09-10: 30 mg via INTRAVENOUS

## 2022-09-10 MED ORDER — LIDOCAINE HCL (PF) 2 % IJ SOLN
INTRAMUSCULAR | Status: AC
  Filled 2022-09-10: qty 5

## 2022-09-10 MED ORDER — CHLORHEXIDINE GLUCONATE CLOTH 2 % EX PADS
6.0000 | MEDICATED_PAD | Freq: Every day | CUTANEOUS | Status: DC
  Administered 2022-09-10: 6 via TOPICAL

## 2022-09-10 MED ORDER — FENTANYL CITRATE (PF) 100 MCG/2ML IJ SOLN
INTRAMUSCULAR | Status: AC
  Filled 2022-09-10: qty 2

## 2022-09-10 MED ORDER — LACTATED RINGERS IV SOLN: INTRAVENOUS | Status: DC

## 2022-09-10 MED ORDER — VANCOMYCIN HCL 1000 MG IV SOLR
INTRAVENOUS | Status: DC | PRN
  Administered 2022-09-10: 1000 mg via TOPICAL

## 2022-09-10 MED ORDER — OXYCODONE HCL 5 MG PO TABS
5.0000 mg | ORAL_TABLET | ORAL | Status: DC | PRN
  Administered 2022-09-10 – 2022-09-11 (×4): 10 mg via ORAL
  Filled 2022-09-10 (×3): qty 2

## 2022-09-10 MED ORDER — MIDAZOLAM HCL 5 MG/5ML IJ SOLN
INTRAMUSCULAR | Status: DC | PRN
  Administered 2022-09-10: 1 mg via INTRAVENOUS

## 2022-09-10 MED ORDER — ONDANSETRON HCL 4 MG/2ML IJ SOLN: 4.0000 mg | Freq: Once | INTRAMUSCULAR | Status: DC | PRN

## 2022-09-10 MED ORDER — DEXAMETHASONE SODIUM PHOSPHATE 10 MG/ML IJ SOLN
INTRAMUSCULAR | Status: AC
  Filled 2022-09-10: qty 1

## 2022-09-10 MED ORDER — DEXAMETHASONE SODIUM PHOSPHATE 10 MG/ML IJ SOLN
INTRAMUSCULAR | Status: DC | PRN
  Administered 2022-09-10: 4 mg via INTRAVENOUS

## 2022-09-10 MED ORDER — TRANEXAMIC ACID-NACL 1000-0.7 MG/100ML-% IV SOLN
1000.0000 mg | Freq: Once | INTRAVENOUS | Status: AC
  Administered 2022-09-10: 1000 mg via INTRAVENOUS
  Filled 2022-09-10: qty 100

## 2022-09-10 MED ORDER — METOCLOPRAMIDE HCL 5 MG PO TABS: 5.0000 mg | ORAL_TABLET | Freq: Three times a day (TID) | ORAL | Status: DC | PRN

## 2022-09-10 MED ORDER — MIDAZOLAM HCL 2 MG/2ML IJ SOLN
INTRAMUSCULAR | Status: AC
  Administered 2022-09-10: 1 mg via INTRAVENOUS
  Filled 2022-09-10: qty 2

## 2022-09-10 MED ORDER — BUPIVACAINE LIPOSOME 1.3 % IJ SUSP
INTRAMUSCULAR | Status: DC | PRN
  Administered 2022-09-10: 10 mL via PERINEURAL

## 2022-09-10 MED ORDER — PHENYLEPHRINE HCL-NACL 20-0.9 MG/250ML-% IV SOLN
INTRAVENOUS | Status: AC
  Filled 2022-09-10: qty 250

## 2022-09-10 MED ORDER — FENTANYL CITRATE PF 50 MCG/ML IJ SOSY
PREFILLED_SYRINGE | INTRAMUSCULAR | Status: AC
  Filled 2022-09-10: qty 1

## 2022-09-10 MED ORDER — ALBUMIN HUMAN 5 % IV SOLN: INTRAVENOUS | Status: DC | PRN

## 2022-09-10 MED ORDER — LEVOTHYROXINE SODIUM 50 MCG PO TABS
50.0000 ug | ORAL_TABLET | Freq: Every day | ORAL | Status: DC
  Administered 2022-09-11: 50 ug via ORAL
  Filled 2022-09-10: qty 1

## 2022-09-10 MED ORDER — PROPRANOLOL HCL 10 MG PO TABS
10.0000 mg | ORAL_TABLET | Freq: Two times a day (BID) | ORAL | Status: DC
  Administered 2022-09-10 – 2022-09-11 (×2): 10 mg via ORAL
  Filled 2022-09-10 (×2): qty 1

## 2022-09-10 MED ORDER — SUGAMMADEX SODIUM 200 MG/2ML IV SOLN
INTRAVENOUS | Status: DC | PRN
  Administered 2022-09-10: 200 mg via INTRAVENOUS

## 2022-09-10 MED ORDER — SODIUM CHLORIDE 0.9 % IV SOLN: INTRAVENOUS | Status: DC | PRN

## 2022-09-10 MED ORDER — CEFAZOLIN SODIUM-DEXTROSE 2-4 GM/100ML-% IV SOLN: 2.0000 g | INTRAVENOUS | Status: DC

## 2022-09-10 MED ORDER — CEFAZOLIN SODIUM-DEXTROSE 2-4 GM/100ML-% IV SOLN
2.0000 g | Freq: Four times a day (QID) | INTRAVENOUS | Status: AC
  Administered 2022-09-10 – 2022-09-11 (×3): 2 g via INTRAVENOUS
  Filled 2022-09-10 (×3): qty 100

## 2022-09-10 MED ORDER — DOCUSATE SODIUM 100 MG PO CAPS
100.0000 mg | ORAL_CAPSULE | Freq: Two times a day (BID) | ORAL | Status: DC
  Administered 2022-09-11: 100 mg via ORAL
  Filled 2022-09-10: qty 1

## 2022-09-10 MED ORDER — ONDANSETRON HCL 4 MG/2ML IJ SOLN
INTRAMUSCULAR | Status: AC
  Filled 2022-09-10: qty 2

## 2022-09-10 MED ORDER — LOSARTAN POTASSIUM 50 MG PO TABS
100.0000 mg | ORAL_TABLET | Freq: Every day | ORAL | Status: DC
  Administered 2022-09-10 – 2022-09-11 (×2): 100 mg via ORAL
  Filled 2022-09-10 (×2): qty 2

## 2022-09-10 MED ORDER — BUPIVACAINE HCL (PF) 0.5 % IJ SOLN
INTRAMUSCULAR | Status: DC | PRN
  Administered 2022-09-10: 15 mL via PERINEURAL

## 2022-09-10 MED ORDER — SODIUM CHLORIDE 0.9% FLUSH: 10.0000 mL | INTRAVENOUS | Status: DC | PRN

## 2022-09-10 MED ORDER — LACTATED RINGERS IV SOLN: INTRAVENOUS | Status: DC | PRN

## 2022-09-10 MED ORDER — FENTANYL CITRATE PF 50 MCG/ML IJ SOSY
25.0000 ug | PREFILLED_SYRINGE | INTRAMUSCULAR | Status: DC | PRN
  Administered 2022-09-10: 25 ug via INTRAVENOUS
  Administered 2022-09-10: 50 ug via INTRAVENOUS
  Administered 2022-09-10: 25 ug via INTRAVENOUS

## 2022-09-10 MED ORDER — POLYETHYLENE GLYCOL 3350 17 G PO PACK: 17.0000 g | PACK | Freq: Every day | ORAL | Status: DC

## 2022-09-10 MED ORDER — ACETAMINOPHEN 325 MG PO TABS: 325.0000 mg | ORAL_TABLET | Freq: Four times a day (QID) | ORAL | Status: DC | PRN

## 2022-09-10 MED ORDER — TRANEXAMIC ACID-NACL 1000-0.7 MG/100ML-% IV SOLN
1000.0000 mg | INTRAVENOUS | Status: AC
  Administered 2022-09-10: 1000 mg via INTRAVENOUS
  Filled 2022-09-10: qty 100

## 2022-09-10 MED ORDER — DULOXETINE HCL 60 MG PO CPEP
60.0000 mg | ORAL_CAPSULE | Freq: Every day | ORAL | Status: DC
  Administered 2022-09-10 – 2022-09-11 (×2): 60 mg via ORAL
  Filled 2022-09-10 (×2): qty 1

## 2022-09-10 MED ORDER — HYDROMORPHONE HCL 1 MG/ML IJ SOLN
0.5000 mg | INTRAMUSCULAR | Status: DC | PRN
  Administered 2022-09-10: 1 mg via INTRAVENOUS
  Filled 2022-09-10: qty 1

## 2022-09-10 MED ORDER — ROCURONIUM BROMIDE 10 MG/ML (PF) SYRINGE
PREFILLED_SYRINGE | INTRAVENOUS | Status: AC
  Filled 2022-09-10: qty 10

## 2022-09-10 MED ORDER — OXYCODONE HCL 5 MG PO TABS
5.0000 mg | ORAL_TABLET | Freq: Once | ORAL | Status: AC | PRN
  Administered 2022-09-10: 5 mg via ORAL

## 2022-09-10 MED ORDER — ONDANSETRON HCL 4 MG PO TABS: 4.0000 mg | ORAL_TABLET | Freq: Four times a day (QID) | ORAL | Status: DC | PRN

## 2022-09-10 MED ORDER — FENTANYL CITRATE PF 50 MCG/ML IJ SOSY
PREFILLED_SYRINGE | INTRAMUSCULAR | Status: AC
  Administered 2022-09-10: 50 ug via INTRAVENOUS
  Filled 2022-09-10: qty 2

## 2022-09-10 MED ORDER — PHENYLEPHRINE HCL-NACL 20-0.9 MG/250ML-% IV SOLN
INTRAVENOUS | Status: DC | PRN
  Administered 2022-09-10: 50 ug/min via INTRAVENOUS

## 2022-09-10 MED ORDER — STERILE WATER FOR IRRIGATION IR SOLN
Status: DC | PRN
  Administered 2022-09-10: 1000 mL

## 2022-09-10 MED ORDER — CEFAZOLIN SODIUM-DEXTROSE 2-4 GM/100ML-% IV SOLN
2.0000 g | INTRAVENOUS | Status: AC
  Administered 2022-09-10: 2 g via INTRAVENOUS
  Filled 2022-09-10: qty 100

## 2022-09-10 MED ORDER — ONDANSETRON HCL 4 MG/2ML IJ SOLN
INTRAMUSCULAR | Status: DC | PRN
  Administered 2022-09-10: 4 mg via INTRAVENOUS

## 2022-09-10 MED ORDER — OXYCODONE HCL 5 MG PO TABS
10.0000 mg | ORAL_TABLET | ORAL | Status: DC | PRN
  Filled 2022-09-10: qty 2

## 2022-09-10 MED ORDER — OXYCODONE HCL 10 MG PO TABS: 10.0000 mg | ORAL_TABLET | Freq: Three times a day (TID) | ORAL | 0 refills | Status: DC | PRN

## 2022-09-10 MED ORDER — METOCLOPRAMIDE HCL 5 MG/ML IJ SOLN: 5.0000 mg | Freq: Three times a day (TID) | INTRAMUSCULAR | Status: DC | PRN

## 2022-09-10 MED ORDER — KETOROLAC TROMETHAMINE 30 MG/ML IJ SOLN
30.0000 mg | Freq: Once | INTRAMUSCULAR | Status: AC | PRN
  Administered 2022-09-10: 30 mg via INTRAVENOUS

## 2022-09-10 MED ORDER — ZOLPIDEM TARTRATE 5 MG PO TABS: 5.0000 mg | ORAL_TABLET | Freq: Every evening | ORAL | Status: DC | PRN

## 2022-09-10 MED ORDER — EPHEDRINE SULFATE (PRESSORS) 50 MG/ML IJ SOLN
INTRAMUSCULAR | Status: DC | PRN
  Administered 2022-09-10 (×2): 10 mg via INTRAVENOUS
  Administered 2022-09-10: 15 mg via INTRAVENOUS
  Administered 2022-09-10: 1 mg via INTRAVENOUS
  Administered 2022-09-10: 10 mg via INTRAVENOUS

## 2022-09-10 MED ORDER — LIDOCAINE HCL (CARDIAC) PF 100 MG/5ML IV SOSY
PREFILLED_SYRINGE | INTRAVENOUS | Status: DC | PRN
  Administered 2022-09-10: 100 mg via INTRAVENOUS

## 2022-09-10 MED ORDER — ACETAMINOPHEN 500 MG PO TABS
1000.0000 mg | ORAL_TABLET | Freq: Four times a day (QID) | ORAL | Status: DC
  Administered 2022-09-10 – 2022-09-11 (×3): 1000 mg via ORAL
  Filled 2022-09-10 (×3): qty 2

## 2022-09-10 MED ORDER — MIDAZOLAM HCL 2 MG/2ML IJ SOLN
INTRAMUSCULAR | Status: AC
  Filled 2022-09-10: qty 2

## 2022-09-10 MED ORDER — GABAPENTIN 300 MG PO CAPS
600.0000 mg | ORAL_CAPSULE | Freq: Every day | ORAL | Status: DC
  Administered 2022-09-10: 600 mg via ORAL
  Filled 2022-09-10: qty 2

## 2022-09-10 MED ORDER — POVIDONE-IODINE 10 % EX SWAB
2.0000 | Freq: Once | CUTANEOUS | Status: AC
  Administered 2022-09-10: 2 via TOPICAL

## 2022-09-10 MED ORDER — ENOXAPARIN SODIUM 40 MG/0.4ML IJ SOSY
40.0000 mg | PREFILLED_SYRINGE | INTRAMUSCULAR | Status: DC
  Administered 2022-09-11: 40 mg via SUBCUTANEOUS
  Filled 2022-09-10: qty 0.4

## 2022-09-10 MED ORDER — OXYCODONE HCL 5 MG/5ML PO SOLN: 5.0000 mg | Freq: Once | ORAL | Status: AC | PRN

## 2022-09-10 MED ORDER — PHENYLEPHRINE HCL (PRESSORS) 10 MG/ML IV SOLN
INTRAVENOUS | Status: DC | PRN
  Administered 2022-09-10: 100 ug via INTRAVENOUS

## 2022-09-10 MED ORDER — ALBUMIN HUMAN 5 % IV SOLN
INTRAVENOUS | Status: AC
  Filled 2022-09-10: qty 500

## 2022-09-10 MED ORDER — PROPOFOL 10 MG/ML IV BOLUS
INTRAVENOUS | Status: AC
  Filled 2022-09-10: qty 20

## 2022-09-10 MED ORDER — PANTOPRAZOLE SODIUM 40 MG PO TBEC
40.0000 mg | DELAYED_RELEASE_TABLET | Freq: Every day | ORAL | Status: DC
  Administered 2022-09-10 – 2022-09-11 (×2): 40 mg via ORAL
  Filled 2022-09-10 (×2): qty 1

## 2022-09-10 MED ORDER — ATORVASTATIN CALCIUM 20 MG PO TABS
20.0000 mg | ORAL_TABLET | Freq: Every day | ORAL | Status: DC
  Administered 2022-09-10 – 2022-09-11 (×2): 20 mg via ORAL
  Filled 2022-09-10 (×2): qty 1

## 2022-09-10 MED ORDER — 0.9 % SODIUM CHLORIDE (POUR BTL) OPTIME
TOPICAL | Status: DC | PRN
  Administered 2022-09-10: 1000 mL

## 2022-09-10 MED ORDER — MENTHOL 3 MG MT LOZG: 1.0000 | LOZENGE | OROMUCOSAL | Status: DC | PRN

## 2022-09-10 MED ORDER — PHENOL 1.4 % MT LIQD: 1.0000 | OROMUCOSAL | Status: DC | PRN

## 2022-09-10 MED ORDER — PROPOFOL 10 MG/ML IV BOLUS
INTRAVENOUS | Status: DC | PRN
  Administered 2022-09-10: 100 mg via INTRAVENOUS

## 2022-09-10 MED ORDER — VANCOMYCIN HCL 1000 MG IV SOLR
INTRAVENOUS | Status: AC
  Filled 2022-09-10: qty 20

## 2022-09-10 MED ORDER — FENTANYL CITRATE PF 50 MCG/ML IJ SOSY: 50.0000 ug | PREFILLED_SYRINGE | Freq: Once | INTRAMUSCULAR | Status: AC

## 2022-09-10 MED ORDER — MIDAZOLAM HCL 2 MG/2ML IJ SOLN: 1.0000 mg | Freq: Once | INTRAMUSCULAR | Status: AC

## 2022-09-10 MED ORDER — KETOROLAC TROMETHAMINE 30 MG/ML IJ SOLN
INTRAMUSCULAR | Status: AC
  Filled 2022-09-10: qty 1

## 2022-09-10 MED ORDER — CHLORHEXIDINE GLUCONATE 4 % EX LIQD: 60.0000 mL | Freq: Once | CUTANEOUS | Status: DC

## 2022-09-10 MED ORDER — VITAMIN D 25 MCG (1000 UNIT) PO TABS
1000.0000 [IU] | ORAL_TABLET | Freq: Every day | ORAL | Status: DC
  Administered 2022-09-10 – 2022-09-11 (×2): 1000 [IU] via ORAL
  Filled 2022-09-10 (×2): qty 1

## 2022-09-10 SURGICAL SUPPLY — 70 items
BAG COUNTER SPONGE SURGICOUNT (BAG) IMPLANT
BAG SPNG CNTER NS LX DISP (BAG) ×1
BAG ZIPLOCK 12X15 (MISCELLANEOUS) ×1 IMPLANT
BASEPLATE GLENOSPHERE 25 (Plate) IMPLANT
BEARING HUMERAL SHLDER 36M STD (Shoulder) IMPLANT
BIT DRILL TWIST 2.7 (BIT) IMPLANT
BLADE SAG 18X100X1.27 (BLADE) ×1 IMPLANT
BNDG ELASTIC 2 VLCR STRL LF (GAUZE/BANDAGES/DRESSINGS) IMPLANT
BNDG ELASTIC 4X5.8 VLCR STR LF (GAUZE/BANDAGES/DRESSINGS) IMPLANT
BNDG GAUZE DERMACEA FLUFF 4 (GAUZE/BANDAGES/DRESSINGS) IMPLANT
COVER BACK TABLE 60X90IN (DRAPES) ×1 IMPLANT
COVER SURGICAL LIGHT HANDLE (MISCELLANEOUS) ×1 IMPLANT
DRAPE ORTHO SPLIT 77X108 STRL (DRAPES) ×2
DRAPE SHEET LG 3/4 BI-LAMINATE (DRAPES) ×1 IMPLANT
DRAPE SURG 17X11 SM STRL (DRAPES) ×1 IMPLANT
DRAPE SURG ORHT 6 SPLT 77X108 (DRAPES) ×2 IMPLANT
DRAPE TOP 10253 STERILE (DRAPES) ×1 IMPLANT
DRAPE U-SHAPE 47X51 STRL (DRAPES) ×1 IMPLANT
DRSG AQUACEL AG ADV 3.5X 6 (GAUZE/BANDAGES/DRESSINGS) IMPLANT
DRSG AQUACEL AG ADV 3.5X10 (GAUZE/BANDAGES/DRESSINGS) ×1 IMPLANT
DURAPREP 26ML APPLICATOR (WOUND CARE) IMPLANT
ELECT REM PT RETURN 15FT ADLT (MISCELLANEOUS) ×1 IMPLANT
FACESHIELD WRAPAROUND (MASK) ×1 IMPLANT
FACESHIELD WRAPAROUND OR TEAM (MASK) ×1 IMPLANT
GAUZE SPONGE 4X4 12PLY STRL (GAUZE/BANDAGES/DRESSINGS) IMPLANT
GLENOID SPHERE 36MM CVD +3 (Orthopedic Implant) IMPLANT
GLOVE BIO SURGEON STRL SZ7.5 (GLOVE) ×4 IMPLANT
GLOVE BIOGEL PI IND STRL 8 (GLOVE) ×2 IMPLANT
GOWN STRL REUS W/ TWL XL LVL3 (GOWN DISPOSABLE) ×2 IMPLANT
GOWN STRL REUS W/TWL XL LVL3 (GOWN DISPOSABLE) ×2
KIT BASIN OR (CUSTOM PROCEDURE TRAY) ×1 IMPLANT
KIT TURNOVER KIT A (KITS) IMPLANT
MANIFOLD NEPTUNE II (INSTRUMENTS) ×1 IMPLANT
NDL TAPERED W/ NITINOL LOOP (MISCELLANEOUS) IMPLANT
NEEDLE TAPERED W/ NITINOL LOOP (MISCELLANEOUS) IMPLANT
NS IRRIG 1000ML POUR BTL (IV SOLUTION) ×1 IMPLANT
PACK SHOULDER (CUSTOM PROCEDURE TRAY) ×1 IMPLANT
PADDING CAST COTTON 6X4 STRL (CAST SUPPLIES) IMPLANT
PADDING UNDERCAST 2X4 STRL (CAST SUPPLIES) IMPLANT
PIN THREADED REVERSE (PIN) IMPLANT
PROTECTOR NERVE ULNAR (MISCELLANEOUS) ×1 IMPLANT
RESTRAINT HEAD UNIVERSAL NS (MISCELLANEOUS) IMPLANT
SCREW BONE STRL 6.5MMX25MM (Screw) IMPLANT
SCREW LOCKING 4.75MMX15MM (Screw) IMPLANT
SCREW LOCKING NS 4.75MMX20MM (Screw) IMPLANT
SHOULDER HUMERAL BEAR 36M STD (Shoulder) ×1 IMPLANT
SLING ARM FOAM STRAP MED (SOFTGOODS) IMPLANT
SLING ARM IMMOBILIZER XL (CAST SUPPLIES) IMPLANT
SMARTMIX MINI TOWER (MISCELLANEOUS)
SPONGE T-LAP 4X18 ~~LOC~~+RFID (SPONGE) IMPLANT
STEM HUMERAL STRL 7MMX83MM (Stem) IMPLANT
STRIP CLOSURE SKIN 1/2X4 (GAUZE/BANDAGES/DRESSINGS) ×1 IMPLANT
SUCTION FRAZIER HANDLE 10FR (MISCELLANEOUS) ×1
SUCTION TUBE FRAZIER 10FR DISP (MISCELLANEOUS) ×1 IMPLANT
SUT BROADBAND TAPE 2PK 1.5 (SUTURE) IMPLANT
SUT FIBERWIRE #2 38 T-5 BLUE (SUTURE)
SUT MAXBRAID #2 CVD NDL (SUTURE) IMPLANT
SUT MON AB 3-0 SH 27 (SUTURE) ×1
SUT MON AB 3-0 SH27 (SUTURE) ×1 IMPLANT
SUT VIC AB 0 CT1 36 (SUTURE) ×1 IMPLANT
SUT VIC AB 1 CT1 36 (SUTURE) ×1 IMPLANT
SUT VIC AB 2-0 CT1 27 (SUTURE) ×1
SUT VIC AB 2-0 CT1 TAPERPNT 27 (SUTURE) ×1 IMPLANT
SUTURE FIBERWR #2 38 T-5 BLUE (SUTURE) IMPLANT
SUTURE TAPE 1.3 40 TPR END (SUTURE) ×2 IMPLANT
SUTURETAPE 1.3 40 TPR END (SUTURE) ×2
TOWEL OR 17X26 10 PK STRL BLUE (TOWEL DISPOSABLE) ×1 IMPLANT
TOWER SMARTMIX MINI (MISCELLANEOUS) IMPLANT
TRAY HUM REV SHOULDER STD +6 (Shoulder) IMPLANT
TUBE SUCTION HIGH CAP CLEAR NV (SUCTIONS) ×1 IMPLANT

## 2022-09-10 SURGICAL SUPPLY — 50 items
BAG COUNTER SPONGE SURGICOUNT (BAG) IMPLANT
BAG SPNG CNTER NS LX DISP (BAG)
BAG ZIPLOCK 12X15 (MISCELLANEOUS) ×2 IMPLANT
BLADE SAG 18X100X1.27 (BLADE) ×2 IMPLANT
COVER BACK TABLE 60X90IN (DRAPES) ×2 IMPLANT
COVER SURGICAL LIGHT HANDLE (MISCELLANEOUS) ×2 IMPLANT
DRAPE ORTHO SPLIT 77X108 STRL (DRAPES) ×2
DRAPE SHEET LG 3/4 BI-LAMINATE (DRAPES) ×2 IMPLANT
DRAPE SURG 17X11 SM STRL (DRAPES) ×2 IMPLANT
DRAPE SURG ORHT 6 SPLT 77X108 (DRAPES) ×4 IMPLANT
DRAPE TOP 10253 STERILE (DRAPES) ×2 IMPLANT
DRAPE U-SHAPE 47X51 STRL (DRAPES) ×2 IMPLANT
DRSG AQUACEL AG ADV 3.5X 6 (GAUZE/BANDAGES/DRESSINGS) IMPLANT
DRSG AQUACEL AG ADV 3.5X10 (GAUZE/BANDAGES/DRESSINGS) ×2 IMPLANT
DURAPREP 26ML APPLICATOR (WOUND CARE) IMPLANT
ELECT REM PT RETURN 15FT ADLT (MISCELLANEOUS) ×2 IMPLANT
FACESHIELD WRAPAROUND (MASK) ×1 IMPLANT
FACESHIELD WRAPAROUND OR TEAM (MASK) ×1 IMPLANT
GLOVE BIO SURGEON STRL SZ7.5 (GLOVE) ×8 IMPLANT
GLOVE BIOGEL PI IND STRL 8 (GLOVE) ×4 IMPLANT
GOWN STRL REUS W/ TWL XL LVL3 (GOWN DISPOSABLE) ×4 IMPLANT
GOWN STRL REUS W/TWL XL LVL3 (GOWN DISPOSABLE) ×2
KIT BASIN OR (CUSTOM PROCEDURE TRAY) ×2 IMPLANT
KIT TURNOVER KIT A (KITS) IMPLANT
MANIFOLD NEPTUNE II (INSTRUMENTS) ×2 IMPLANT
NDL TAPERED W/ NITINOL LOOP (MISCELLANEOUS) IMPLANT
NEEDLE TAPERED W/ NITINOL LOOP (MISCELLANEOUS) IMPLANT
NS IRRIG 1000ML POUR BTL (IV SOLUTION) ×2 IMPLANT
PACK SHOULDER (CUSTOM PROCEDURE TRAY) ×2 IMPLANT
PROTECTOR NERVE ULNAR (MISCELLANEOUS) ×2 IMPLANT
RESTRAINT HEAD UNIVERSAL NS (MISCELLANEOUS) IMPLANT
SLING ARM FOAM STRAP MED (SOFTGOODS) IMPLANT
SMARTMIX MINI TOWER (MISCELLANEOUS)
SPONGE T-LAP 4X18 ~~LOC~~+RFID (SPONGE) IMPLANT
STRIP CLOSURE SKIN 1/2X4 (GAUZE/BANDAGES/DRESSINGS) ×2 IMPLANT
SUCTION FRAZIER HANDLE 10FR (MISCELLANEOUS) ×1
SUCTION TUBE FRAZIER 10FR DISP (MISCELLANEOUS) ×2 IMPLANT
SUT FIBERWIRE #2 38 T-5 BLUE (SUTURE)
SUT MON AB 3-0 SH 27 (SUTURE) ×1
SUT MON AB 3-0 SH27 (SUTURE) ×2 IMPLANT
SUT VIC AB 0 CT1 36 (SUTURE) ×2 IMPLANT
SUT VIC AB 1 CT1 36 (SUTURE) ×2 IMPLANT
SUT VIC AB 2-0 CT1 27 (SUTURE) ×1
SUT VIC AB 2-0 CT1 TAPERPNT 27 (SUTURE) ×2 IMPLANT
SUTURE FIBERWR #2 38 T-5 BLUE (SUTURE) IMPLANT
SUTURE TAPE 1.3 40 TPR END (SUTURE) ×4 IMPLANT
SUTURETAPE 1.3 40 TPR END (SUTURE) ×2
TOWEL OR 17X26 10 PK STRL BLUE (TOWEL DISPOSABLE) ×2 IMPLANT
TOWER SMARTMIX MINI (MISCELLANEOUS) IMPLANT
TUBE SUCTION HIGH CAP CLEAR NV (SUCTIONS) ×2 IMPLANT

## 2022-09-10 NOTE — Anesthesia Procedure Notes (Signed)
Anesthesia Regional Block: Interscalene brachial plexus block   Pre-Anesthetic Checklist: , timeout performed,  Correct Patient, Correct Site, Correct Laterality,  Correct Procedure, Correct Position, site marked,  Risks and benefits discussed,  Surgical consent,  Pre-op evaluation,  At surgeon's request and post-op pain management  Laterality: Right  Prep: chloraprep       Needles:  Injection technique: Single-shot  Needle Type: Echogenic Needle     Needle Length: 9cm      Additional Needles:   Procedures:,,,, ultrasound used (permanent image in chart),,    Narrative:  Start time: 09/10/2022 11:34 AM End time: 09/10/2022 11:45 AM Injection made incrementally with aspirations every 5 mL.  Performed by: Personally  Anesthesiologist: Myrtie Soman, MD  Additional Notes: Patient tolerated the procedure well without complications

## 2022-09-10 NOTE — Plan of Care (Signed)
  Problem: Education: Goal: Knowledge of General Education information will improve Description: Including pain rating scale, medication(s)/side effects and non-pharmacologic comfort measures Outcome: Progressing   Problem: Health Behavior/Discharge Planning: Goal: Ability to manage health-related needs will improve Outcome: Progressing   Problem: Clinical Measurements: Goal: Ability to maintain clinical measurements within normal limits will improve Outcome: Progressing Goal: Will remain free from infection Outcome: Progressing Goal: Diagnostic test results will improve Outcome: Progressing Goal: Respiratory complications will improve Outcome: Progressing Goal: Cardiovascular complication will be avoided Outcome: Progressing   Problem: Activity: Goal: Risk for activity intolerance will decrease Outcome: Progressing   Problem: Nutrition: Goal: Adequate nutrition will be maintained Outcome: Progressing   Problem: Coping: Goal: Level of anxiety will decrease Outcome: Progressing   Problem: Elimination: Goal: Will not experience complications related to bowel motility Outcome: Progressing Goal: Will not experience complications related to urinary retention Outcome: Progressing   Problem: Pain Managment: Goal: General experience of comfort will improve Outcome: Progressing   Problem: Safety: Goal: Ability to remain free from injury will improve Outcome: Progressing   Problem: Skin Integrity: Goal: Risk for impaired skin integrity will decrease Outcome: Progressing   Problem: Education: Goal: Knowledge of the prescribed therapeutic regimen will improve Outcome: Progressing Goal: Understanding of activity limitations/precautions following surgery will improve Outcome: Progressing Goal: Individualized Educational Video(s) Outcome: Progressing   Problem: Activity: Goal: Ability to tolerate increased activity will improve Outcome: Progressing   Problem: Pain  Management: Goal: Pain level will decrease with appropriate interventions Outcome: Progressing   Problem: Education: Goal: Knowledge of the prescribed therapeutic regimen will improve Outcome: Progressing Goal: Understanding of activity limitations/precautions following surgery will improve Outcome: Progressing Goal: Individualized Educational Video(s) Outcome: Progressing   Problem: Activity: Goal: Ability to tolerate increased activity will improve Outcome: Progressing   Problem: Pain Management: Goal: Pain level will decrease with appropriate interventions Outcome: Progressing

## 2022-09-10 NOTE — Telephone Encounter (Signed)
[facility] Pharmacist is requiring that the oxycodone dose be adjusted or they will not fill for this patient. Pam asks that you you write for Oxycodone 10 mg tid prn severe pain # 21 please.

## 2022-09-10 NOTE — Progress Notes (Signed)
Bruising and swelling noted on lower half of bandage going toward elbow. Called Dr Stann Mainland, he instructed to ice and elevate that extremity. Ice pack and pillow placed appropriately. Will continue to monitor.

## 2022-09-10 NOTE — Progress Notes (Signed)
PROGRESS NOTE   Subjective/Complaints:  Doing well. About to go to [facility] for TSA. No new issues overnight. Pain controlled  ROS: Patient denies fever, rash, sore throat, blurred vision, dizziness, nausea, vomiting, diarrhea, cough, shortness of breath or chest pain,  headache, or mood change.    Objective:   No results found. No results for input(s): "WBC", "HGB", "HCT", "PLT" in the last 72 hours.  No results for input(s): "NA", "K", "CL", "CO2", "GLUCOSE", "BUN", "CREATININE", "CALCIUM" in the last 72 hours.   Intake/Output Summary (Last 24 hours) at 09/10/2022 0910 Last data filed at 09/10/2022 0857 Gross per 24 hour  Intake 472 ml  Output --  Net 472 ml        Physical Exam: Vital Signs Blood pressure (!) 147/90, pulse 88, temperature 97.6 F (36.4 C), resp. rate 16, height 5' 6.5" (1.689 m), weight 98.1 kg, SpO2 100 %.   Constitutional: No distress . Vital signs reviewed. HEENT: NCAT, EOMI, oral membranes moist Neck: supple Cardiovascular: RRR without murmur. No JVD    Respiratory/Chest: CTA Bilaterally without wheezes or rales. Normal effort    GI/Abdomen: BS +, non-tender, non-distended Ext: no clubbing, cyanosis, or edema Psych: pleasant and cooperative  Skin: pinsites are clean, dry, dressed. Bruises about RUE/shoulder. Ecchymoses below right eye.   Neuro:  Alert and oriented x 3. Normal insight and awareness. Mild defs with ST Memory. Normal language and speech. Cranial nerve exam unremarkable. UE limited by ortho but able move left arm and wiggle fingers of both hands. LE's grossly 3-4/5. Sensory exam normal for light touch and pain in all 4 limbs. No limb ataxia or cerebellar signs. No abnormal tone appreciated.---no changes Musculoskeletal: RUE with ex-fix at elbow, left wrist with splint--becoming a bit looser but looking but intact   Assessment/Plan: 1. Functional deficits which require 3+ hours per day  of interdisciplinary therapy in a comprehensive inpatient rehab setting. Physiatrist is providing close team supervision and 24 hour management of active medical problems listed below. Physiatrist and rehab team continue to assess barriers to discharge/monitor patient progress toward functional and medical goals  Care Tool:  Bathing    Body parts bathed by patient: Chest, Abdomen, Right upper leg, Left upper leg, Face, Right lower leg, Left lower leg, Front perineal area, Buttocks, Right arm, Left arm   Body parts bathed by helper: Right arm, Left arm     Bathing assist Assist Level: Supervision/Verbal cueing     Upper Body Dressing/Undressing Upper body dressing   What is the patient wearing?: Pull over shirt    Upper body assist Assist Level: Contact Guard/Touching assist    Lower Body Dressing/Undressing Lower body dressing      What is the patient wearing?: Pants, Underwear/pull up     Lower body assist Assist for lower body dressing: Supervision/Verbal cueing     Toileting Toileting    Toileting assist Assist for toileting: Supervision/Verbal cueing     Transfers Chair/bed transfer  Transfers assist     Chair/bed transfer assist level: Supervision/Verbal cueing     Locomotion Ambulation   Ambulation assist      Assist level: Supervision/Verbal cueing Assistive device: No Device Max distance:  978 ft   Walk 10 feet activity   Assist     Assist level: Supervision/Verbal cueing Assistive device: No Device   Walk 50 feet activity   Assist    Assist level: Supervision/Verbal cueing Assistive device: No Device    Walk 150 feet activity   Assist Walk 150 feet activity did not occur: Safety/medical concerns  Assist level: Supervision/Verbal cueing Assistive device: No Device    Walk 10 feet on uneven surface  activity   Assist Walk 10 feet on uneven surfaces activity did not occur: Safety/medical concerns   Assist level: Contact  Guard/Touching assist     Wheelchair     Assist Is the patient using a wheelchair?: No             Wheelchair 50 feet with 2 turns activity    Assist            Wheelchair 150 feet activity     Assist          Blood pressure (!) 147/90, pulse 88, temperature 97.6 F (36.4 C), resp. rate 16, height 5' 6.5" (1.689 m), weight 98.1 kg, SpO2 100 %.  Medical Problem List and Plan: 1. 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             -to [facility] today for Right TSA  -f/u with our office in 4 weeks.  2.  Antithrombotics: -DVT/anticoagulation:  Pharmaceutical: Lovenox             -antiplatelet therapy: N/a 3. Pain Management: N/A 4. Mood/Behavior/Sleep: .              -antipsychotic agents: N/A  -pt dealing with a lot of adjustment anxiety.   -has hx of anxiety and depression--on cymbalta  -added xanax prn    -Dr. Kieth Brightly input appreciated!    5. Neuropsych/cognition: This patient is capable of making decisions on her own behalf. 6. Skin/Wound Care: Monitor wound for healing. Routine pressure relief measures.  7. Fluids/Electrolytes/Nutrition: Monitor I/O. Check CMET in am.              -- recent lab work is within normal limits.  8. Right elbow Fx/dislocation: Per ortho: Nonweightbearing right upper extremity             Okay to move digits and wrist as tolerated             No lifting with right upper extremity              ex fix x 4-6 weeks   -ortho updated wound care orders. Pin sites look good 9. Left distal radius/ulna Fx s/p ORIF 9/14: Nonweightbearing to left wrist. Maintain splint.             Okay to use left hand for basic ADLs and to feed self             No axial loading of the left wrist             Ice and elevate             Digit motion as tolerated--moves well             No motion restrictions to elbow and  shoulder  10. Right proximal humerus Fx:  Nonweightbearing right upper extremity             Okay to  move digits and wrist as tolerated             No lifting with right upper extremity  TSA today 11. Sundowning/Insomnia: Will start Sleep-wake chart to monitor for disruption             -seemed to have a better night. Continue to observe.  12. Tachycardia: Heart rate up to 140's with activity likely due to deconditioning             --monitor for symptoms with activity.  13. Acute blood loss anemia:   --Macrocytosis-->check anemia panel/folic acid.  --hgb stable at 9.4 9/25 14. Thrombocytopenia/Neutropenia: Has resolved--likely reactive.  H/o of thrombocytopenia in the past.             --wbcs and plts stable to improved 15. H/o Tremors:  used inderal bid at home-->resumed --no active tremors    17.  Metabolic bone disease:  borderline low @ 30.91. started pt on ergocalciferol --on vitamin D supplement.     LOS: 7 days A FACE TO FACE EVALUATION WAS PERFORMED  Meredith Staggers 09/10/2022, 9:10 AM

## 2022-09-10 NOTE — Telephone Encounter (Addendum)
PMP was Reviewed.  In contact with Reesa Chew PA.  Oxycodone e-scribed to pharmacy , TID as needed for pain, 21 tablets .

## 2022-09-10 NOTE — Progress Notes (Signed)
Inpatient Rehabilitation Discharge Medication Review by a Pharmacist  A complete drug regimen review was completed for this patient to identify any potential clinically significant medication issues.  High Risk Drug Classes Is patient taking? Indication by Medication  Antipsychotic No   Anticogulant No   Antibiotic No   Opioid Yes Oxycodone-prn pain  Antiplatelet No   Hypoglycemics/insulin No   Vasoactive Medication Yes Losartan, propranolol - BP  Chemotherapy No   Other Yes Atorvastatin - HLD Duloxetine, gabapentin - pain Ferrous sulfate- iron supplement/anemia Levothyroxine - low thryoid Methocarbamol - spasms Pantoprazole - reflux Zaleplon - sleep     Type of Medication Issue Identified Description of Issue Recommendation(s)  Drug Interaction(s) (clinically significant)     Duplicate Therapy     Allergy     No Medication Administration End Date     Incorrect Dose     Additional Drug Therapy Needed     Significant med changes from prior encounter (inform family/care partners about these prior to discharge).    Other  PTA Famotidine (pepcid)  discontinued No issues. Changed to Protonix on discharge.    Clinically significant medication issues were identified that warrant physician communication and completion of prescribed/recommended actions by midnight of the next day:  No  Pharmacist comments:   Time spent performing this drug regimen review (minutes):  15 minutes  Nicole Cella, RPh Clinical Pharmacist  09/10/2022

## 2022-09-10 NOTE — Anesthesia Postprocedure Evaluation (Signed)
Anesthesia Post Note  Patient: Vanessa [name]  Procedure(s) Performed: REVERSE SHOULDER ARTHROPLASTY (Right: Shoulder)     Patient location during evaluation: PACU Anesthesia Type: Regional and General Level of consciousness: awake and alert Pain management: pain level controlled Vital Signs Assessment: post-procedure vital signs reviewed and stable Respiratory status: spontaneous breathing, nonlabored ventilation, respiratory function stable and patient connected to nasal cannula oxygen Cardiovascular status: blood pressure returned to baseline and stable Postop Assessment: no apparent nausea or vomiting Anesthetic complications: no   No notable events documented.  Last Vitals:  Vitals:   09/10/22 1540 09/10/22 1545  BP: 126/72 137/61  Pulse: 86 90  Resp: 17 14  Temp: 36.4 C   SpO2: 100% 98%    Last Pain:  Vitals:   09/10/22 1540  PainSc: 0-No pain                 Maurion Walkowiak S

## 2022-09-10 NOTE — Anesthesia Preprocedure Evaluation (Deleted)
Anesthesia Evaluation  Patient identified by MRN, date of birth, ID band Patient awake    Reviewed: Allergy & Precautions, NPO status , Patient's Chart, lab work & pertinent test results  Airway Mallampati: II  TM Distance: >3 FB Neck ROM: Full    Dental no notable dental hx.    Pulmonary sleep apnea ,    Pulmonary exam normal breath sounds clear to auscultation       Cardiovascular hypertension, Pt. on medications Normal cardiovascular exam Rhythm:Regular Rate:Normal     Neuro/Psych negative neurological ROS  negative psych ROS   GI/Hepatic negative GI ROS, Neg liver ROS,   Endo/Other  Hypothyroidism   Renal/GU negative Renal ROS  negative genitourinary   Musculoskeletal negative musculoskeletal ROS (+)   Abdominal   Peds negative pediatric ROS (+)  Hematology  (+) Blood dyscrasia, anemia ,   Anesthesia Other Findings   Reproductive/Obstetrics negative OB ROS                             Anesthesia Physical Anesthesia Plan  ASA: 3  Anesthesia Plan: General   Post-op Pain Management: Regional block*   Induction: Intravenous  PONV Risk Score and Plan: 3 and Ondansetron, Dexamethasone and Treatment may vary due to age or medical condition  Airway Management Planned: Oral ETT  Additional Equipment:   Intra-op Plan:   Post-operative Plan: Extubation in OR  Informed Consent: I have reviewed the patients History and Physical, chart, labs and discussed the procedure including the risks, benefits and alternatives for the proposed anesthesia with the patient or authorized representative who has indicated his/her understanding and acceptance.     Dental advisory given  Plan Discussed with: CRNA and Surgeon  Anesthesia Plan Comments:         Anesthesia Quick Evaluation

## 2022-09-10 NOTE — Anesthesia Procedure Notes (Signed)
Procedure Name: Intubation Date/Time: 09/10/2022 12:53 PM  Performed by: Garrel Ridgel, CRNAPre-anesthesia Checklist: Patient identified, Emergency Drugs available, Suction available and Patient being monitored Patient Re-evaluated:Patient Re-evaluated prior to induction Oxygen Delivery Method: Circle system utilized Preoxygenation: Pre-oxygenation with 100% oxygen Induction Type: IV induction Ventilation: Mask ventilation without difficulty Laryngoscope Size: Mac and 3 Grade View: Grade II Tube type: Oral Tube size: 7.0 mm Number of attempts: 1 Airway Equipment and Method: Stylet and Oral airway Placement Confirmation: ETT inserted through vocal cords under direct vision, positive ETCO2 and breath sounds checked- equal and bilateral Secured at: 21 cm Tube secured with: Tape Dental Injury: Teeth and Oropharynx as per pre-operative assessment

## 2022-09-10 NOTE — Op Note (Addendum)
Date: 09/10/2022   PRE-OPERATIVE DIAGNOSIS:  Right four-part with head splitting proximal humerus fracture   POST-OPERATIVE DIAGNOSIS:  Same   PROCEDURE:  1. REVERSE SHOULDER ARTHROPLASTY   SURGEON:  Nicholes Stairs, MD   ASSISTANT: Jonelle Sidle, PA-C  Assistant attestation:  PA McClung present for the entire procedure.   ANESTHESIA:   General with a block   ESTIMATED BLOOD LOSS: 700 cc  Blood products: 1 unit packed red blood cells administered intraoperatively   PREOPERATIVE INDICATIONS: Vanessa [name] is a right-hand-dominant 71 year old female who sustained a right proximal humerus fracture following a fall.  She had a pretty significant fall down about 12 steps resulting in significant bilateral upper extremity trauma.  This did result in an unstable right elbow fracture and dislocation that was treated with open reduction internal fixation fixation with external fixator for augmented stability.  She continues to be in the external fixator.  We did have to delay this procedure to allow for some soft tissue recovery.  This was all managed by my orthopedic trauma colleague, Dr. Altamese Mountain.  Due to the comminution and displaced nature of the fracture and head splitting components we discussed operative management.  We discussed moving forward with reverse shoulder arthroplasty for his injury to allow earlier weight bearing on a walker and/or cane as needed.  The risks benefits and alternatives were discussed with the patient preoperatively including but not limited to the risks of infection, bleeding, nerve injury, cardiopulmonary complications, the need for revision surgery, dislocation, brachial plexus palsy, incomplete relief of pain, among others, and the patient was willing to proceed. The patient did provided informed consent.   OPERATIVE IMPLANTS: Biomet size 7 mini comprehensive humeral stem press fitted  with a 17mm +6 medialized polyethylene liner and a 36 mm +3 glenosphere  with a 25 mm (mini) baseplate and 4, 624THL locking screws and one central 6.5 mm nonlocking screw.   OPERATIVE FINDINGS:   Significant proximal humerus trauma noted with head splitting component, greater tuberosity fracture fragment.  The lesser tuberosity was still fixed to the humeral head piece that was externally rotated and impacted on the anterior rim of the glenoid.  There was also surgical neck component with significant medial displacement of the humeral shaft piece.  There was no obvious rotator cuff tearing.  There was a fragment of the humeral head which had flipped 90 degrees vertical and was impacted into the proximal humerus such that it To the humeral canal.  Lastly, the long head of the biceps tendon was lacerated at the level of the surgical neck fracture.    OPERATIVE PROCEDURE: The patient was brought to the operating room and placed in the supine position. General anesthesia was administered. IV antibiotics were given. Time out was performed. The upper extremity was prepped and draped in usual sterile fashion. The patient was in a beachchair position. Deltopectoral approach was carried out. After dissection through skin and subcutaneous fat, the cephalic vein was identified with the deltopectoral interval.  This was mobilized and taken medially.   The fracture was identified and working through the fracture in the biceps groove the lesser and greater tuberosities were freed up and tagged with #2 Fiber wire sutures.  The humeral head was fragmented and removed from the wound.     Next, the long head of the biceps tendon was identified but noted to be transected at the level of the fracture.  The stump was removed from the glenoid.   I then performed circumferential  releases of the humerus.  I then moved to sizing the humerus.  There was minimal calcar bone loss and this was used to reference the height of the stem, as was the upper border of the pectoralis major tendon.  The canal was  reamed and found to fit best with a 7 mm mini comprehensive stem.   We next turned to the glenoid.  Deep retractors were placed, and I resected the labrum as well as the residual long head of biceps, and then placed a guidepin into the center position on the glenoid, with slight inferior declination. I then reamed over the guidepin, and this created a small metaphyseal cancellus blush inferiorly, removing just the cartilage to the subchondral bone superiorly. The base plate was selected and impacted place, and then I secured it centrally with a nonlocking screw, and I had excellent purchase both inferiorly and superiorly. I placed a short locking screws on anterior aspect,  And posterior aspect.   I then turned my attention to the glenosphere, and impacted this into place, placing slight inferior offset.    The glenosphere was completely seated, and had engagement of the [facility] taper. I then turned my attention back to the humerus.    The 7 mm mini comprehensive stem was seated to the appropriate height to allow approximate 5.6 cm from the top of the Pectoralis major tendon to the top of the glenosphere.  The stem was placed in 30 degrees of retroversion.   Once the stem was impacted into place we trialed poly liners.  Using the standard humeral metaglen,  The shoulder had excellent motion, and was stable.  We did use the +6 liner to help with stability and medialized the humeral shaft due to not wanting to over tension to allow tuberosity healing.  The final poly was impacted and again showed good motion and stability.  Next, I irrigated the wounds copiously.    The greater tuberosity was brought back to the humeral stem suture holes and secured with bone graft from the humeral head as augment.  The lesser tuberosity was unfortunately not able to be brought back to the stem due to lateralization from the +3 glenosphere, but the deltoid was noted to have excellent tension.  The axillary nerve was palpated  at the end of implanting, and found to be in continuity and not under undo tension.   I then irrigated the shoulder copiously once more, we also placed 1 g of vancomycin powder into the interval, repaired the deltopectoral interval with Vicryl followed by subcutaneous monocryl and then subcuticular monocryl with Steri-Strips and sterile gauze for the skin. The patient was awakened and returned back in stable and satisfactory condition. There no complications and he tolerated the procedure well.  All counts were correct.  The patient awakened from general anesthesia with no complications and transferred to PACU in stable condition.   Postoperative Plan: Vanessa [name] will remain in his sling until her regional block has worn off.  We will admit her overnight for observation and pain management.  Also we will check a hemoglobin in the a.m. given the intraoperative blood loss.  Otherwise nonweightbearing through the shoulder other than removing for pendulums and scapular retractions.  Maintain sling until follow-up with me in 2 weeks.  WB per elbow, and Dr. Carlean Jews team.

## 2022-09-10 NOTE — Anesthesia Preprocedure Evaluation (Signed)
Anesthesia Evaluation  Patient identified by MRN, date of birth, ID band Patient awake    Reviewed: Allergy & Precautions, NPO status , Patient's Chart, lab work & pertinent test results  Airway Mallampati: II  TM Distance: >3 FB Neck ROM: Full    Dental no notable dental hx.    Pulmonary sleep apnea ,    Pulmonary exam normal breath sounds clear to auscultation       Cardiovascular hypertension, negative cardio ROS Normal cardiovascular exam Rhythm:Regular Rate:Normal     Neuro/Psych negative neurological ROS  negative psych ROS   GI/Hepatic negative GI ROS, Neg liver ROS,   Endo/Other  negative endocrine ROS  Renal/GU negative Renal ROS  negative genitourinary   Musculoskeletal negative musculoskeletal ROS (+)   Abdominal   Peds negative pediatric ROS (+)  Hematology  (+) Blood dyscrasia, anemia ,   Anesthesia Other Findings   Reproductive/Obstetrics negative OB ROS                             Anesthesia Physical Anesthesia Plan  ASA: 3  Anesthesia Plan: General   Post-op Pain Management: Regional block*   Induction: Intravenous  PONV Risk Score and Plan: 3 and Ondansetron, Dexamethasone and Treatment may vary due to age or medical condition  Airway Management Planned: Oral ETT  Additional Equipment:   Intra-op Plan:   Post-operative Plan: Extubation in OR  Informed Consent: I have reviewed the patients History and Physical, chart, labs and discussed the procedure including the risks, benefits and alternatives for the proposed anesthesia with the patient or authorized representative who has indicated his/her understanding and acceptance.     Dental advisory given  Plan Discussed with: CRNA and Surgeon  Anesthesia Plan Comments:         Anesthesia Quick Evaluation

## 2022-09-10 NOTE — Progress Notes (Signed)
Inpatient Rehabilitation Care Coordinator Discharge Note   Patient Details  Name: Vanessa [name] MRN: [id number] Date of Birth: 1951/11/23   Discharge location: D/ to [facility] for should surgery  Length of Stay: 6 days  Discharge activity level: Supervision  Home/community participation: Limited  Patient response WG:NFAOZH Literacy - How often do you need to have someone help you when you read instructions, pamphlets, or other written material from your doctor or pharmacy?: Never  Patient response YQ:MVHQIO Isolation - How often do you feel lonely or isolated from those around you?: Never  Services provided included: MD, RD, PT, SLP, RN, TR, Pharmacy, SW, Neuropsych, CM, OT  Financial Services:  Charity fundraiser Utilized: [place] [employer]  Choices offered to/list presented to: N/A  Follow-up services arranged:  Outpatient, DME    Outpatient Servicies: All therapies deferred until WB status changes. DME : [facility]    Patient response to transportation need: Is the patient able to respond to transportation needs?: Yes In the past 12 months, has lack of transportation kept you from medical appointments or from getting medications?: No In the past 12 months, has lack of transportation kept you from meetings, work, or from getting things needed for daily living?: No   Comments (or additional information):  Patient/Family verbalized understanding of follow-up arrangements:  Yes  Individual responsible for coordination of the follow-up plan: contact pt or pt husband Vanessa [name]  Confirmed correct DME delivered: Rana Snare 09/10/2022    Rana Snare

## 2022-09-10 NOTE — Transfer of Care (Signed)
Immediate Anesthesia Transfer of Care Note  Patient: Vanessa [name]  Procedure(s) Performed: REVERSE SHOULDER ARTHROPLASTY (Right: Shoulder)  Patient Location: PACU  Anesthesia Type:GA combined with regional for post-op pain  Level of Consciousness: awake, alert  and oriented  Airway & Oxygen Therapy: Patient Spontanous Breathing and Patient connected to face mask oxygen  Post-op Assessment: Report given to RN and Post -op Vital signs reviewed and stable  Post vital signs: Reviewed and stable  Last Vitals:  Vitals Value Taken Time  BP 126/72 09/10/22 1537  Temp    Pulse 86 09/10/22 1539  Resp 17 09/10/22 1539  SpO2 100 % 09/10/22 1539  Vitals shown include unvalidated device data.  Last Pain:  Vitals:   09/10/22 1024  PainSc: 0-No pain         Complications: No notable events documented.

## 2022-09-10 NOTE — Progress Notes (Signed)
Orthopedic Tech Progress Note Patient Details:  Vanessa [name] Apr 15, 1951 [id number]  Ortho Devices Type of Ortho Device: Velcro wrist splint Ortho Device/Splint Location: LUE Ortho Device/Splint Interventions: Ordered, Application, Adjustment   Post Interventions Patient Tolerated: Well Instructions Provided: Adjustment of device, Care of device  Arville Go 09/10/2022, 8:57 AM

## 2022-09-10 NOTE — Brief Op Note (Signed)
09/10/2022  3:07 PM  PATIENT:  Vanessa [name]  71 y.o. female  PRE-OPERATIVE DIAGNOSIS:  right proximal humerus fracture  POST-OPERATIVE DIAGNOSIS:  right proximal humerus fracture with head splitting component  PROCEDURE:  Procedure(s): REVERSE SHOULDER ARTHROPLASTY (Right)  SURGEON:  Surgeon(s) and Role:    * Nicholes Stairs, MD - Primary  PHYSICIAN ASSISTANT: Jonelle Sidle, PA-C  ANESTHESIA:   regional and general  EBL:  700 mL   BLOOD ADMINISTERED: 1 unit CC PRBC  DRAINS: none   LOCAL MEDICATIONS USED:  NONE  SPECIMEN:  No Specimen  DISPOSITION OF SPECIMEN:  N/A  COUNTS:  YES  TOURNIQUET:  * No tourniquets in log *  DICTATION: .Note written in EPIC  PLAN OF CARE: Admit for overnight observation  PATIENT DISPOSITION:  PACU - hemodynamically stable.   Delay start of Pharmacological VTE agent (>24hrs) due to surgical blood loss or risk of bleeding: not applicable

## 2022-09-10 NOTE — Addendum Note (Signed)
Addended by: Bayard Hugger on: 09/10/2022 11:53 AM   Modules accepted: Orders

## 2022-09-10 NOTE — Anesthesia Procedure Notes (Signed)
Anesthesia Procedure Image    

## 2022-09-10 NOTE — Progress Notes (Signed)
Pt has arrived to 1332 from PACU s/p right reverse shoulder arthroplasty.  Report accepted from [place], [place].  Pt is alert and oriented.  Daughter and husband at bedside.  R arm is elevated and ice intact.  Bruising and swelling present.  Room orientation completed with call bell placed at bedside.  Will continue to monitor pt.

## 2022-09-10 NOTE — H&P (Signed)
H&P update  The surgical history has been reviewed and remains accurate without interval change.  The patient was re-examined and patient's physiologic condition has not changed significantly in the last 30 days. The condition still exists that makes this procedure necessary. The treatment plan remains the same, without new options for care.  No new pharmacological allergies or types of therapy has been initiated that would change the plan or the appropriateness of the plan.  The patient and/or family understand the potential benefits and risks.  Vanessa Acero P. Stann Mainland, MD 09/10/2022 12:29 PM

## 2022-09-11 ENCOUNTER — Encounter (HOSPITAL_COMMUNITY): Payer: Self-pay | Admitting: Orthopedic Surgery

## 2022-09-11 DIAGNOSIS — S42241A 4-part fracture of surgical neck of right humerus, initial encounter for closed fracture: Secondary | ICD-10-CM | POA: Diagnosis not present

## 2022-09-11 LAB — BASIC METABOLIC PANEL
Anion gap: 6 (ref 5–15)
BUN: 14 mg/dL (ref 8–23)
CO2: 25 mmol/L (ref 22–32)
Calcium: 8.6 mg/dL — ABNORMAL LOW (ref 8.9–10.3)
Chloride: 107 mmol/L (ref 98–111)
Creatinine, Ser: 0.61 mg/dL (ref 0.44–1.00)
GFR, Estimated: >60 mL/min (ref >60)
Glucose, Bld: 130 mg/dL — ABNORMAL HIGH (ref 70–99)
Potassium: 3.9 mmol/L (ref 3.5–5.1)
Sodium: 138 mmol/L (ref 135–145)

## 2022-09-11 LAB — HEMOGLOBIN AND HEMATOCRIT, BLOOD
HCT: 30.3 % — ABNORMAL LOW (ref 36.0–46.0)
Hemoglobin: 9.5 g/dL — ABNORMAL LOW (ref 12.0–15.0)

## 2022-09-11 NOTE — Discharge Summary (Cosign Needed Addendum)
Patient ID: Vanessa [name] MRN: [id number] DOB/AGE: [birth year] 71 y.o.  Admit date: 09/10/2022 Discharge date: 09/11/22  Primary Diagnosis: Right proximal humerus fracture Admission Diagnoses: Status post right reverse total shoulder arthroplasty. Past Medical History:  Diagnosis Date   Anxiety    Arthritis    Bladder prolapse, female, acquired    COVID-19 03/2022   Depression    Hypertension    Myalgia    Occasional tremors    OSA (obstructive sleep apnea)    mild   Restless leg syndrome    Thrombocytopenia (HCC) in the past    Discharge Diagnoses:   Principal Problem:   S/P reverse total shoulder arthroplasty, right  Estimated body mass index is 34.38 kg/m as calculated from the following:   Height as of this encounter: 5' 6.5" (1.689 m).   Weight as of this encounter: 98.1 kg.  Procedure:  Procedure(s) (LRB): REVERSE SHOULDER ARTHROPLASTY (Right)   Consults: None  HPI: Vanessa [name] is a right-hand-dominant 71 year old female who sustained a right proximal humerus fracture following a fall.  She had a pretty significant fall down about 12 steps resulting in significant bilateral upper extremity trauma.  This did result in an unstable right elbow fracture and dislocation that was treated with open reduction internal fixation fixation with external fixator for augmented stability.  She continues to be in the external fixator.  We did have to delay this procedure to allow for some soft tissue recovery.  This was all managed by my orthopedic trauma colleague, Dr. Myrene Galas.  Due to the comminution and displaced nature of the fracture and head splitting components we discussed operative management.  We discussed moving forward with reverse shoulder arthroplasty for his injury to allow earlier weight bearing on a walker and/or cane as needed.  She was transferred from [facility] to [facility] for right reverse total shoulder arthroplasty and admitted for overnight  monitoring. Laboratory Data: Admission on 09/10/2022  Component Date Value Ref Range Status   ABO/RH(D) 09/10/2022 B POS   Final   Antibody Screen 09/10/2022 NEG   Final   Sample Expiration 09/10/2022 09/13/2022,2359   Final   Unit Number 09/10/2022 [id number]   Final   Blood Component Type 09/10/2022 RED CELLS,LR   Final   Unit division 09/10/2022 00   Final   Status of Unit 09/10/2022 ALLOCATED   Final   Transfusion Status 09/10/2022 OK TO TRANSFUSE   Final   Crossmatch Result 09/10/2022 Compatible   Final   Unit Number 09/10/2022 [id number]   Final   Blood Component Type 09/10/2022 RED CELLS,LR   Final   Unit division 09/10/2022 00   Final   Status of Unit 09/10/2022 [facility]   Final   Transfusion Status 09/10/2022 OK TO TRANSFUSE   Final   Crossmatch Result 09/10/2022    Final                   Value:Compatible Performed at [facility], [address], [place], [place] [address]    Blood Product Unit Number 09/10/2022 [id number]   Final   PRODUCT CODE 09/10/2022 [id number]   Final   Unit Type and Rh 09/10/2022 7300   Final   Blood Product Expiration Date 09/10/2022 [id number]   Final   ISSUE DATE / TIME 09/10/2022 [id number]   Final   Blood Product Unit Number 09/10/2022 [id number]   Final   PRODUCT CODE 09/10/2022 [id number]   Final   Unit Type and Rh 09/10/2022 7300  Final   Blood Product Expiration Date 09/10/2022 [id number]   Final   WBC 09/10/2022 5.5  4.0 - 10.5 K/uL Final   RBC 09/10/2022 3.13 (L)  3.87 - 5.11 MIL/uL Final   Hemoglobin 09/10/2022 10.2 (L)  12.0 - 15.0 g/dL Final   HCT 81/19/1478 31.9 (L)  36.0 - 46.0 % Final   MCV 09/10/2022 101.9 (H)  80.0 - 100.0 fL Final   MCH 09/10/2022 32.6  26.0 - 34.0 pg Final   MCHC 09/10/2022 32.0  30.0 - 36.0 g/dL Final   RDW 29/56/2130 17.3 (H)  11.5 - 15.5 % Final   Platelets 09/10/2022 185  150 - 400 K/uL Final   nRBC 09/10/2022 0.0  0.0 - 0.2 % Final   Performed at [facility], [address] [place], [place] [address]   Creatinine, Ser 09/10/2022 0.83  0.44 - 1.00 mg/dL Final   GFR, Estimated 09/10/2022 >60  >60 mL/min Final   Comment: (NOTE) Calculated using the CKD-EPI Creatinine Equation (2021) Performed at [facility], [address] [place], [place] [address]    Hemoglobin 09/11/2022 9.5 (L)  12.0 - 15.0 g/dL Final   HCT 95/28/4132 30.3 (L)  36.0 - 46.0 % Final   Performed at [facility], [address], [place], [place] [address]   Sodium 09/11/2022 138  135 - 145 mmol/L Final   Potassium 09/11/2022 3.9  3.5 - 5.1 mmol/L Final   Chloride 09/11/2022 107  98 - 111 mmol/L Final   CO2 09/11/2022 25  22 - 32 mmol/L Final   Glucose, Bld 09/11/2022 130 (H)  70 - 99 mg/dL Final   Glucose reference range applies only to samples taken after fasting for at least 8 hours.   BUN 09/11/2022 14  8 - 23 mg/dL Final   Creatinine, Ser 09/11/2022 0.61  0.44 - 1.00 mg/dL Final   Calcium 27/25/3664 8.6 (L)  8.9 - 10.3 mg/dL Final   GFR, Estimated 09/11/2022 >60  >60 mL/min Final   Comment: (NOTE) Calculated using the CKD-EPI Creatinine Equation (2021)    Anion gap 09/11/2022 6  5 - 15 Final   Performed at [facility], [address], [place], [place] [address]  Admission on 09/03/2022, Discharged on 09/10/2022  Component Date Value Ref Range Status   Sodium 09/04/2022 139  135 - 145 mmol/L Final   Potassium 09/04/2022 3.6  3.5 - 5.1 mmol/L Final   Chloride 09/04/2022 107  98 - 111 mmol/L Final   CO2 09/04/2022 27  22 - 32 mmol/L Final   Glucose, Bld 09/04/2022 122 (H)  70 - 99 mg/dL Final   Glucose reference range applies only to samples taken after fasting for at least 8 hours.   BUN 09/04/2022 15  8 - 23 mg/dL Final   Creatinine, Ser 09/04/2022 0.69  0.44 - 1.00 mg/dL Final   Calcium 42/59/5638 8.6 (L)  8.9 - 10.3 mg/dL Final   Total Protein 75/64/3329 5.4 (L)  6.5 - 8.1 g/dL Final    Albumin 51/88/4166 2.7 (L)  3.5 - 5.0 g/dL Final   AST 06/12/1600 20  15 - 41 U/L Final   ALT 09/04/2022 21  0 - 44 U/L Final   Alkaline Phosphatase 09/04/2022 64  38 - 126 U/L Final   Total Bilirubin 09/04/2022 1.0  0.3 - 1.2 mg/dL Final   GFR, Estimated 09/04/2022 >60  >60 mL/min Final   Comment: (NOTE) Calculated using the CKD-EPI Creatinine  Equation (2021)    Anion gap 09/04/2022 5  5 - 15 Final   Performed at [facility], [address], [place], [place] [address]   WBC 09/04/2022 5.2  4.0 - 10.5 K/uL Final   RBC 09/04/2022 2.59 (L)  3.87 - 5.11 MIL/uL Final   Hemoglobin 09/04/2022 8.6 (L)  12.0 - 15.0 g/dL Final   HCT 69/67/8938 26.6 (L)  36.0 - 46.0 % Final   MCV 09/04/2022 102.7 (H)  80.0 - 100.0 fL Final   MCH 09/04/2022 33.2  26.0 - 34.0 pg Final   MCHC 09/04/2022 32.3  30.0 - 36.0 g/dL Final   RDW 09/29/5101 16.9 (H)  11.5 - 15.5 % Final   Platelets 09/04/2022 170  150 - 400 K/uL Final   nRBC 09/04/2022 0.6 (H)  0.0 - 0.2 % Final   Neutrophils Relative % 09/04/2022 60  % Final   Neutro Abs 09/04/2022 3.2  1.7 - 7.7 K/uL Final   Lymphocytes Relative 09/04/2022 24  % Final   Lymphs Abs 09/04/2022 1.2  0.7 - 4.0 K/uL Final   Monocytes Relative 09/04/2022 9  % Final   Monocytes Absolute 09/04/2022 0.5  0.1 - 1.0 K/uL Final   Eosinophils Relative 09/04/2022 4  % Final   Eosinophils Absolute 09/04/2022 0.2  0.0 - 0.5 K/uL Final   Basophils Relative 09/04/2022 1  % Final   Basophils Absolute 09/04/2022 0.0  0.0 - 0.1 K/uL Final   Immature Granulocytes 09/04/2022 2  % Final   Abs Immature Granulocytes 09/04/2022 0.08 (H)  0.00 - 0.07 K/uL Final   Performed at [facility], [address], [place], [place] [address]   Vitamin B-12 09/04/2022 254  180 - 914 pg/mL Final   Comment: (NOTE) This assay is not validated for testing neonatal or myeloproliferative syndrome specimens for Vitamin B12 levels. Performed at [facility], [address]  [place], [place] [address]    Folate 09/04/2022 9.3  >5.9 ng/mL Final   Performed at [facility], [address], [place], [place] [address]   Iron 09/04/2022 62  28 - 170 ug/dL Final   TIBC 44/31/5400 255  250 - 450 ug/dL Final   Saturation Ratios 09/04/2022 24  10.4 - 31.8 % Final   UIBC 09/04/2022 193  ug/dL Final   Performed at [facility], [address], [place], [place] [address]   Ferritin 09/04/2022 168  11 - 307 ng/mL Final   Performed at [facility], [address] [place], [place] [address]   Retic Ct Pct 09/04/2022 7.8 (H)  0.4 - 3.1 % Final   RBC. 09/04/2022 2.72 (L)  3.87 - 5.11 MIL/uL Final   Retic Count, Absolute 09/04/2022 213.0 (H)  19.0 - 186.0 K/uL Final   Immature Retic Fract 09/04/2022 34.1 (H)  2.3 - 15.9 % Final   Performed at [facility], [address], [place], [place] [address]   Sodium 09/07/2022 139  135 - 145 mmol/L Final   Potassium 09/07/2022 3.7  3.5 - 5.1 mmol/L Final   Chloride 09/07/2022 104  98 - 111 mmol/L Final   CO2 09/07/2022 25  22 - 32 mmol/L Final   Glucose, Bld 09/07/2022 114 (H)  70 - 99 mg/dL Final   Glucose reference range applies only to samples taken after fasting for at least 8 hours.   BUN 09/07/2022 14  8 - 23 mg/dL Final   Creatinine,  Ser 09/07/2022 0.71  0.44 - 1.00 mg/dL Final   Calcium 16/09/9603 9.0  8.9 - 10.3 mg/dL Final   GFR, Estimated 09/07/2022 >60  >60 mL/min Final   Comment: (NOTE) Calculated using the CKD-EPI Creatinine Equation (2021)    Anion gap 09/07/2022 10  5 - 15 Final   Performed at [facility], [address] [place], [place] [address]   WBC 09/07/2022 5.2  4.0 - 10.5 K/uL Final   RBC 09/07/2022 2.87 (L)  3.87 - 5.11 MIL/uL Final   Hemoglobin 09/07/2022 9.4 (L)  12.0 - 15.0 g/dL Final   HCT 11/91/4782 29.3 (L)  36.0 - 46.0 % Final   MCV 09/07/2022 102.1 (H)  80.0 - 100.0 fL Final   MCH 09/07/2022 32.8  26.0 - 34.0 pg Final   MCHC 09/07/2022 32.1  30.0 - 36.0 g/dL Final    RDW 95/62/1308 17.1 (H)  11.5 - 15.5 % Final   Platelets 09/07/2022 198  150 - 400 K/uL Final   nRBC 09/07/2022 0.0  0.0 - 0.2 % Final   Performed at [facility], [address] [place], [place] [address]  Admission on 08/25/2022, Discharged on 09/03/2022  Component Date Value Ref Range Status   SARS Coronavirus 2 by RT PCR 08/25/2022 NEGATIVE  NEGATIVE Final   Comment: (NOTE) SARS-CoV-2 target nucleic acids are NOT DETECTED.  The SARS-CoV-2 RNA is generally detectable in upper respiratory specimens during the acute phase of infection. The lowest concentration of SARS-CoV-2 viral copies this assay can detect is 138 copies/mL. A negative result does not preclude SARS-Cov-2 infection and should not be used as the sole basis for treatment or other patient management decisions. A negative result may occur with  improper specimen collection/handling, submission of specimen other than nasopharyngeal swab, presence of viral mutation(s) within the areas targeted by this assay, and inadequate number of viral copies(<138 copies/mL). A negative result must be combined with clinical observations, patient history, and epidemiological information. The expected result is Negative.  Fact Sheet for Patients:  BloggerCourse.com  Fact Sheet for Healthcare Providers:  SeriousBroker.it  This test is no                          t yet approved or cleared by the [place] FDA and  has been authorized for detection and/or diagnosis of SARS-CoV-2 by FDA under an Emergency Use Authorization (EUA). This EUA will remain  in effect (meaning this test can be used) for the duration of the COVID-19 declaration under Section 564(b)(1) of the Act, 21 U.S.C.section 360bbb-3(b)(1), unless the authorization is terminated  or revoked sooner.       Influenza A by PCR 08/25/2022 NEGATIVE  NEGATIVE Final   Influenza B by PCR 08/25/2022 NEGATIVE  NEGATIVE  Final   Comment: (NOTE) The Xpert Xpress SARS-CoV-2/FLU/RSV plus assay is intended as an aid in the diagnosis of influenza from Nasopharyngeal swab specimens and should not be used as a sole basis for treatment. Nasal washings and aspirates are unacceptable for Xpert Xpress SARS-CoV-2/FLU/RSV testing.  Fact Sheet for Patients: BloggerCourse.com  Fact Sheet for Healthcare Providers: SeriousBroker.it  This test is not yet approved or cleared by the [place] FDA and has been authorized for detection and/or diagnosis of SARS-CoV-2 by FDA under an Emergency Use Authorization (EUA). This EUA will remain in effect (meaning this test can be used) for the duration of the COVID-19 declaration under Section 564(b)(1) of the  Act, 21 U.S.C. section 360bbb-3(b)(1), unless the authorization is terminated or revoked.  Performed at [facility], [address] [place], [place] [address]    Sodium 08/25/2022 140  135 - 145 mmol/L Final   Potassium 08/25/2022 4.0  3.5 - 5.1 mmol/L Final   Chloride 08/25/2022 110  98 - 111 mmol/L Final   CO2 08/25/2022 21 (L)  22 - 32 mmol/L Final   Glucose, Bld 08/25/2022 157 (H)  70 - 99 mg/dL Final   Glucose reference range applies only to samples taken after fasting for at least 8 hours.   BUN 08/25/2022 18  8 - 23 mg/dL Final   Creatinine, Ser 08/25/2022 0.80  0.44 - 1.00 mg/dL Final   Calcium 60/45/409809/11/2022 9.1  8.9 - 10.3 mg/dL Final   Total Protein 11/91/478209/11/2022 6.2 (L)  6.5 - 8.1 g/dL Final   Albumin 95/62/130809/11/2022 3.5  3.5 - 5.0 g/dL Final   AST 65/78/469609/11/2022 26  15 - 41 U/L Final   ALT 08/25/2022 23  0 - 44 U/L Final   Alkaline Phosphatase 08/25/2022 67  38 - 126 U/L Final   Total Bilirubin 08/25/2022 0.8  0.3 - 1.2 mg/dL Final   GFR, Estimated 08/25/2022 >60  >60 mL/min Final   Comment: (NOTE) Calculated using the CKD-EPI Creatinine Equation (2021)    Anion gap 08/25/2022 9  5 - 15 Final   Performed  at [facility], [address] [place], [place] [address]   Sodium 08/25/2022 142  135 - 145 mmol/L Final   Potassium 08/25/2022 4.0  3.5 - 5.1 mmol/L Final   Chloride 08/25/2022 107  98 - 111 mmol/L Final   BUN 08/25/2022 20  8 - 23 mg/dL Final   Creatinine, Ser 08/25/2022 0.70  0.44 - 1.00 mg/dL Final   Glucose, Bld 41/32/440109/11/2022 159 (H)  70 - 99 mg/dL Final   Glucose reference range applies only to samples taken after fasting for at least 8 hours.   Calcium, Ion 08/25/2022 1.12 (L)  1.15 - 1.40 mmol/L Final   TCO2 08/25/2022 23  22 - 32 mmol/L Final   Hemoglobin 08/25/2022 14.3  12.0 - 15.0 g/dL Final   HCT 02/72/536609/11/2022 42.0  36.0 - 46.0 % Final   WBC 08/25/2022 11.1 (H)  4.0 - 10.5 K/uL Final   RBC 08/25/2022 4.54  3.87 - 5.11 MIL/uL Final   Hemoglobin 08/25/2022 14.5  12.0 - 15.0 g/dL Final   HCT 44/03/474209/11/2022 43.3  36.0 - 46.0 % Final   MCV 08/25/2022 95.4  80.0 - 100.0 fL Final   MCH 08/25/2022 31.9  26.0 - 34.0 pg Final   MCHC 08/25/2022 33.5  30.0 - 36.0 g/dL Final   RDW 59/56/387509/11/2022 13.0  11.5 - 15.5 % Final   Platelets 08/25/2022 163  150 - 400 K/uL Final   nRBC 08/25/2022 0.0  0.0 - 0.2 % Final   Performed at [facility], [address] [place], [place] [address]   Alcohol, Ethyl (B) 08/25/2022 <10  <10 mg/dL Final   Comment: (NOTE) Lowest detectable limit for serum alcohol is 10 mg/dL.  For medical purposes only. Performed at [facility], [address] [place], [place] [address]    Lactic Acid, Venous 08/25/2022 2.0 (HH)  0.5 - 1.9 mmol/L Final   Comment: CRITICAL RESULT CALLED TO, READ BACK BY AND VERIFIED WITH Nani SkillernJ. ONEAL, RN, [phone] 08/25/22, Mliss SaxA. RAMSEY Performed at [facility], [address]  [place], [place] [address]    Prothrombin Time 08/25/2022 13.0  11.4 - 15.2 seconds Final   INR 08/25/2022 1.0  0.8 - 1.2 Final   Comment: (NOTE) INR goal varies based on device and disease states. Performed at [facility], [address]  [place], [place] [address]    Blood Bank Specimen 08/25/2022 SAMPLE AVAILABLE FOR TESTING   Final   Sample Expiration 08/25/2022    Final                   Value:08/26/2022,2359 Performed at [facility], [address], [place], [place] [address]    HIV Screen 4th Generation wRfx 08/25/2022 Non Reactive  Non Reactive Final   Performed at [facility], [address], [place], [place] [address]   Sodium 08/26/2022 138  135 - 145 mmol/L Final   Potassium 08/26/2022 3.8  3.5 - 5.1 mmol/L Final   Chloride 08/26/2022 109  98 - 111 mmol/L Final   CO2 08/26/2022 22  22 - 32 mmol/L Final   Glucose, Bld 08/26/2022 134 (H)  70 - 99 mg/dL Final   Glucose reference range applies only to samples taken after fasting for at least 8 hours.   BUN 08/26/2022 16  8 - 23 mg/dL Final   Creatinine, Ser 08/26/2022 0.71  0.44 - 1.00 mg/dL Final   Calcium 65/78/4696 8.5 (L)  8.9 - 10.3 mg/dL Final   GFR, Estimated 08/26/2022 >60  >60 mL/min Final   Comment: (NOTE) Calculated using the CKD-EPI Creatinine Equation (2021)    Anion gap 08/26/2022 7  5 - 15 Final   Performed at [facility], [address] [place], [place] [address]   MRSA by PCR Next Gen 08/25/2022 NOT DETECTED  NOT DETECTED Final   Comment: (NOTE) The GeneXpert MRSA Assay (FDA approved for NASAL specimens only), is one component of a comprehensive MRSA colonization surveillance program. It is not intended to diagnose MRSA infection nor to guide or monitor treatment for MRSA infections. Test performance is not FDA approved in patients less than 94 years old. Performed at [facility], [address] [place], [place] [address]    WBC 08/26/2022 7.2  4.0 - 10.5 K/uL Final   RBC 08/26/2022 3.59 (L)  3.87 - 5.11 MIL/uL Final   Hemoglobin 08/26/2022 11.6 (L)  12.0 - 15.0 g/dL Final   HCT 40/09/2724 34.1 (L)  36.0 - 46.0 % Final   MCV 08/26/2022 95.0  80.0 - 100.0 fL Final   MCH 08/26/2022 32.3  26.0 - 34.0 pg Final   MCHC  08/26/2022 34.0  30.0 - 36.0 g/dL Final   RDW 36/64/4034 13.3  11.5 - 15.5 % Final   Platelets 08/26/2022 147 (L)  150 - 400 K/uL Final   nRBC 08/26/2022 0.0  0.0 - 0.2 % Final   Performed at [facility], [address], [place], [place] [address]   WBC 08/27/2022 6.8  4.0 - 10.5 K/uL Final   RBC 08/27/2022 3.08 (L)  3.87 - 5.11 MIL/uL Final   Hemoglobin 08/27/2022 9.8 (L)  12.0 - 15.0 g/dL Final   HCT 56/38/7564 29.4 (L)  36.0 - 46.0 % Final   MCV 08/27/2022 95.5  80.0 - 100.0 fL Final   MCH 08/27/2022 31.8  26.0 - 34.0 pg Final   MCHC 08/27/2022 33.3  30.0 - 36.0 g/dL Final   RDW 33/29/5188 13.3  11.5 - 15.5 % Final  Platelets 08/27/2022 109 (L)  150 - 400 K/uL Final   nRBC 08/27/2022 0.0  0.0 - 0.2 % Final   Performed at [facility], [address] [place], [place] [address]   Vit D, 25-Hydroxy 08/27/2022 37.08  30 - 100 ng/mL Final   Comment: (NOTE) Vitamin D deficiency has been defined by the Institute of Medicine  and an Endocrine Society practice guideline as a level of serum 25-OH  vitamin D less than 20 ng/mL (1,2). The Endocrine Society went on to  further define vitamin D insufficiency as a level between 21 and 29  ng/mL (2).  1. IOM (Institute of Medicine). 2010. Dietary reference intakes for  calcium and D. [place]: [employer]. 2. Holick MF, Binkley [place], Bischoff-Ferrari HA, et al. Evaluation,  treatment, and prevention of vitamin D deficiency: an Endocrine  Society clinical practice guideline, JCEM. 2011 Jul; 96(7): 1911-30.  Performed at [facility], [address], [place], [place] [address]    WBC 08/28/2022 7.0  4.0 - 10.5 K/uL Final   RBC 08/28/2022 2.62 (L)  3.87 - 5.11 MIL/uL Final   Hemoglobin 08/28/2022 8.4 (L)  12.0 - 15.0 g/dL Final   HCT 09/81/1914 24.8 (L)  36.0 - 46.0 % Final   MCV 08/28/2022 94.7  80.0 - 100.0 fL Final   MCH 08/28/2022 32.1  26.0 - 34.0 pg Final   MCHC 08/28/2022 33.9  30.0 - 36.0 g/dL  Final   RDW 78/29/5621 13.2  11.5 - 15.5 % Final   Platelets 08/28/2022 141 (L)  150 - 400 K/uL Final   nRBC 08/28/2022 0.0  0.0 - 0.2 % Final   Performed at [facility], [address] [place], [place] [address]   Vit D, 25-Hydroxy 08/28/2022 30.91  30 - 100 ng/mL Final   Comment: (NOTE) Vitamin D deficiency has been defined by the Institute of Medicine  and an Endocrine Society practice guideline as a level of serum 25-OH  vitamin D less than 20 ng/mL (1,2). The Endocrine Society went on to  further define vitamin D insufficiency as a level between 21 and 29  ng/mL (2).  1. IOM (Institute of Medicine). 2010. Dietary reference intakes for  calcium and D. [place]: [employer]. 2. Holick MF, Binkley [place], Bischoff-Ferrari HA, et al. Evaluation,  treatment, and prevention of vitamin D deficiency: an Endocrine  Society clinical practice guideline, JCEM. 2011 Jul; 96(7): 1911-30.  Performed at [facility], [address] [place], [place] [address]    WBC 08/29/2022 5.4  4.0 - 10.5 K/uL Final   RBC 08/29/2022 2.37 (L)  3.87 - 5.11 MIL/uL Final   Hemoglobin 08/29/2022 7.7 (L)  12.0 - 15.0 g/dL Final   HCT 62/95/2841 23.3 (L)  36.0 - 46.0 % Final   MCV 08/29/2022 98.3  80.0 - 100.0 fL Final   MCH 08/29/2022 32.5  26.0 - 34.0 pg Final   MCHC 08/29/2022 33.0  30.0 - 36.0 g/dL Final   RDW 32/44/0102 13.9  11.5 - 15.5 % Final   Platelets 08/29/2022 127 (L)  150 - 400 K/uL Final   nRBC 08/29/2022 0.0  0.0 - 0.2 % Final   Performed at [facility], [address] [place], [place] [address]   Sodium 08/29/2022 141  135 - 145 mmol/L Final   Potassium 08/29/2022 3.1 (L)  3.5 - 5.1 mmol/L Final   Chloride 08/29/2022 109  98 -  111 mmol/L Final   CO2 08/29/2022 25  22 - 32 mmol/L Final   Glucose, Bld 08/29/2022 144 (H)  70 - 99 mg/dL Final   Glucose reference range applies only to samples taken after fasting for at least 8 hours.   BUN 08/29/2022 15  8  - 23 mg/dL Final   Creatinine, Ser 08/29/2022 0.82  0.44 - 1.00 mg/dL Final   Calcium 16/09/9603 8.4 (L)  8.9 - 10.3 mg/dL Final   GFR, Estimated 08/29/2022 >60  >60 mL/min Final   Comment: (NOTE) Calculated using the CKD-EPI Creatinine Equation (2021)    Anion gap 08/29/2022 7  5 - 15 Final   Performed at [facility], [address], [place], [place] [address]   WBC 08/30/2022 3.8 (L)  4.0 - 10.5 K/uL Final   RBC 08/30/2022 2.31 (L)  3.87 - 5.11 MIL/uL Final   Hemoglobin 08/30/2022 7.5 (L)  12.0 - 15.0 g/dL Final   HCT 11/91/4782 22.7 (L)  36.0 - 46.0 % Final   MCV 08/30/2022 98.3  80.0 - 100.0 fL Final   MCH 08/30/2022 32.5  26.0 - 34.0 pg Final   MCHC 08/30/2022 33.0  30.0 - 36.0 g/dL Final   RDW 95/62/1308 14.5  11.5 - 15.5 % Final   Platelets 08/30/2022 121 (L)  150 - 400 K/uL Final   REPEATED TO VERIFY   nRBC 08/30/2022 0.5 (H)  0.0 - 0.2 % Final   Performed at [facility], [address] [place], [place] [address]   Magnesium 08/30/2022 2.0  1.7 - 2.4 mg/dL Final   Performed at [facility], [address] [place], [place] [address]   Sodium 08/30/2022 141  135 - 145 mmol/L Final   Potassium 08/30/2022 3.4 (L)  3.5 - 5.1 mmol/L Final   Chloride 08/30/2022 108  98 - 111 mmol/L Final   CO2 08/30/2022 25  22 - 32 mmol/L Final   Glucose, Bld 08/30/2022 106 (H)  70 - 99 mg/dL Final   Glucose reference range applies only to samples taken after fasting for at least 8 hours.   BUN 08/30/2022 14  8 - 23 mg/dL Final   Creatinine, Ser 08/30/2022 0.67  0.44 - 1.00 mg/dL Final   Calcium 52/84/1324 8.4 (L)  8.9 - 10.3 mg/dL Final   GFR, Estimated 08/30/2022 >60  >60 mL/min Final   Comment: (NOTE) Calculated using the CKD-EPI Creatinine Equation (2021)    Anion gap 08/30/2022 8  5 - 15 Final   Performed at [facility] Lab, [address], [place], [place] [address]   ABO/RH(D) 08/30/2022 B POS   Final   Antibody Screen 08/30/2022 NEG   Final   Sample  Expiration 08/30/2022    Final                   Value:09/02/2022,2359 Performed at [facility], [address], [place], [place] [address]    ABO/RH(D) 08/30/2022    Final                   Value:B POS Performed at [facility], [address] [place], [place] [address]    WBC 08/31/2022 3.4 (L)  4.0 - 10.5 K/uL Final   RBC 08/31/2022 2.47 (L)  3.87 - 5.11 MIL/uL Final   Hemoglobin 08/31/2022 7.8 (L)  12.0 - 15.0 g/dL Final   HCT 47/42/5956 24.9 (L)  36.0 - 46.0 % Final  MCV 08/31/2022 100.8 (H)  80.0 - 100.0 fL Final   MCH 08/31/2022 31.6  26.0 - 34.0 pg Final   MCHC 08/31/2022 31.3  30.0 - 36.0 g/dL Final   RDW 16/09/9603 14.6  11.5 - 15.5 % Final   Platelets 08/31/2022 129 (L)  150 - 400 K/uL Final   nRBC 08/31/2022 2.3 (H)  0.0 - 0.2 % Final   Performed at [facility], [address], [place], [place] [address]   Sodium 08/31/2022 140  135 - 145 mmol/L Final   Potassium 08/31/2022 3.6  3.5 - 5.1 mmol/L Final   Chloride 08/31/2022 110  98 - 111 mmol/L Final   CO2 08/31/2022 25  22 - 32 mmol/L Final   Glucose, Bld 08/31/2022 120 (H)  70 - 99 mg/dL Final   Glucose reference range applies only to samples taken after fasting for at least 8 hours.   BUN 08/31/2022 12  8 - 23 mg/dL Final   Creatinine, Ser 08/31/2022 0.62  0.44 - 1.00 mg/dL Final   Calcium 11/91/4782 8.3 (L)  8.9 - 10.3 mg/dL Final   GFR, Estimated 08/31/2022 >60  >60 mL/min Final   Comment: (NOTE) Calculated using the CKD-EPI Creatinine Equation (2021)    Anion gap 08/31/2022 5  5 - 15 Final   Performed at [facility], [address] [place], [place] [address]   Magnesium 08/31/2022 2.0  1.7 - 2.4 mg/dL Final   Performed at [facility], [address] [place], [place] [address]   WBC 09/01/2022 3.8 (L)  4.0 - 10.5 K/uL Final   RBC 09/01/2022 2.60 (L)  3.87 - 5.11 MIL/uL Final   Hemoglobin 09/01/2022 8.2 (L)  12.0 - 15.0 g/dL Final   HCT 78/46/9629 26.2 (L)  36.0 - 46.0 %  Final   MCV 09/01/2022 100.8 (H)  80.0 - 100.0 fL Final   MCH 09/01/2022 31.5  26.0 - 34.0 pg Final   MCHC 09/01/2022 31.3  30.0 - 36.0 g/dL Final   RDW 52/84/1324 15.3  11.5 - 15.5 % Final   Platelets 09/01/2022 146 (L)  150 - 400 K/uL Final   nRBC 09/01/2022 1.3 (H)  0.0 - 0.2 % Final   Performed at [facility], [address] [place], [place] [address]   WBC 09/02/2022 4.2  4.0 - 10.5 K/uL Final   RBC 09/02/2022 2.52 (L)  3.87 - 5.11 MIL/uL Final   Hemoglobin 09/02/2022 8.2 (L)  12.0 - 15.0 g/dL Final   HCT 72/53/6644 25.3 (L)  36.0 - 46.0 % Final   MCV 09/02/2022 100.4 (H)  80.0 - 100.0 fL Final   MCH 09/02/2022 32.5  26.0 - 34.0 pg Final   MCHC 09/02/2022 32.4  30.0 - 36.0 g/dL Final   RDW 03/47/4259 16.5 (H)  11.5 - 15.5 % Final   Platelets 09/02/2022 162  150 - 400 K/uL Final   nRBC 09/02/2022 1.0 (H)  0.0 - 0.2 % Final   Performed at [facility], [address] [place], [place] [address]   WBC 09/03/2022 4.2  4.0 - 10.5 K/uL Final   RBC 09/03/2022 2.72 (L)  3.87 - 5.11 MIL/uL Final   Hemoglobin 09/03/2022 8.6 (L)  12.0 - 15.0 g/dL Final   HCT 56/43/3295 27.8 (L)  36.0 - 46.0 % Final   MCV 09/03/2022 102.2 (H)  80.0 - 100.0 fL Final   MCH 09/03/2022 31.6  26.0 - 34.0 pg Final  MCHC 09/03/2022 30.9  30.0 - 36.0 g/dL Final   RDW 47/82/9562 16.7 (H)  11.5 - 15.5 % Final   Platelets 09/03/2022 167  150 - 400 K/uL Final   nRBC 09/03/2022 1.0 (H)  0.0 - 0.2 % Final   Performed at [facility], [address] [place], [place] [address]     X-Rays:DG Elbow 2 Views Right  Result Date: 09/10/2022 CLINICAL DATA:  Post radial head replacement. EXAM: RIGHT ELBOW - 2 VIEW COMPARISON:  None Available. FINDINGS: Post replacement of the radial head and reduction of previously noted posterior elbow dislocation. Given obliquity, alignment appears markedly improved. External fixation device is noted fixated at the distal humerus and proximal ulna. Distal shaft of known  right total shoulder replacement humeral component is incompletely imaged. Expected adjacent soft tissue swelling.  No radiopaque foreign body. IMPRESSION: Post radial head replacement and external fixation about the elbow joint without evidence of complication. Electronically Signed   By: Simonne Come M.D.   On: 09/10/2022 16:45   DG Wrist 2 Views Left  Result Date: 09/10/2022 CLINICAL DATA:  Post left wrist surgery. EXAM: LEFT WRIST - 2 VIEW COMPARISON:  Left wrist radiographs-08/25/2022 FINDINGS: Post sideplate ORIF of comminuted distal radial and ulnar fractures with marked improved alignment. Persistent ulnar styloid process fracture without dedicated ORIF. Tiny displaced ossicles are noted about the anterior aspect of the operative site. Expected adjacent soft tissue swelling. No radiopaque foreign body. IMPRESSION: Post sideplate ORIF of comminuted distal radial and ulnar fractures without evidence of complication. Electronically Signed   By: Simonne Come M.D.   On: 09/10/2022 16:42   DG Shoulder Right Port  Result Date: 09/10/2022 CLINICAL DATA:  Post shoulder replacement. EXAM: RIGHT SHOULDER - 1 VIEW COMPARISON:  Right elbow radiographs-earlier same day; right humerus radiographs-08/25/2022 FINDINGS: Provided image demonstrates the sequela of right total shoulder replacement with expected resection of the humeral head with persistent displaced osseous fragments about the humeral hardware. Expected adjacent soft tissue swelling and subcutaneous emphysema. No radiopaque foreign body. Limited visualization of the adjacent thorax demonstrates a presumed central venous catheter tip overlying the superior cavoatrial junction. External fixation hardware is seen within the distal humerus, incompletely imaged. IMPRESSION: Post right total shoulder replacement without evidence of complication. Electronically Signed   By: Simonne Come M.D.   On: 09/10/2022 16:32   CT ELBOW RIGHT WO CONTRAST  Result Date:  08/30/2022 CLINICAL DATA:  Fracture, elbow posterior radial head arthroplasty with persistent instability. EXAM: CT OF THE UPPER RIGHT EXTREMITY WITHOUT CONTRAST TECHNIQUE: Multidetector CT imaging of the upper right extremity was performed according to the standard protocol. RADIATION DOSE REDUCTION: This exam was performed according to the departmental dose-optimization program which includes automated exposure control, adjustment of the mA and/or kV according to patient size and/or use of iterative reconstruction technique. COMPARISON:  08/27/2022, 08/25/2022. FINDINGS: Bones/Joint/Cartilage Examination is limited due to motion artifact. Radial head arthroplasty changes are noted. There is redemonstration of fracture fragments at the lateral humeral condyle, unchanged from 08/25/2022. No dislocation at the elbow. External fixation hardware is noted in the humerus and ulna without evidence of hardware loosening. No joint effusion. Ligaments Suboptimally assessed by CT. Muscles and Tendons No acute abnormality. Soft tissues Subcutaneous fat stranding and edema are noted in the right lower extremity. Fluid is noted posterior to the elbow and proximal forearm. A tiny focus of subcutaneous air is noted along the posterior aspect of the proximal ulna, which was present on 08/17/2022 and  likely related to recent surgery. IMPRESSION: 1. Limited evaluation due to patient's body habitus and motion artifact. 2. Status post radial head arthroplasty with no evidence of dislocation. 3. Nondisplaced fracture of the lateral humeral condyle, which was seen 08/25/2022. 4. Subcutaneous fat stranding, fluid, and foci of air along the posterior aspect of the proximal forearm, likely related to recent surgery. Electronically Signed   By: Thornell Sartorius M.D.   On: 08/30/2022 22:50   DG Elbow 2 Views Left  Result Date: 08/28/2022 CLINICAL DATA:  ORIF right radial head yesterday from fall down stairs. Compared to right side. EXAM:  LEFT ELBOW - 2 VIEW COMPARISON:  08/27/2022 FINDINGS: No acute fracture or dislocation involving the left elbow. There is mild displacement of the anterior fat pad. There is a cast overlying the volar soft tissues of the forearm. Evidence patient's fixation hardware over the distal radius and ulna. IMPRESSION: 1. No acute fracture or dislocation. 2. Previous internal fixation of distal radius and ulna fractures. Hardware intact. Electronically Signed   By: Elberta Fortis M.D.   On: 08/28/2022 15:58   [place] EKG SITE RITE  Result Date: 08/28/2022 If Site Rite image not attached, placement could not be confirmed due to current cardiac rhythm.  DG Elbow 2 Views Right  Result Date: 08/27/2022 CLINICAL DATA:  Fracture. EXAM: RIGHT ELBOW - 2 VIEW COMPARISON:  Right elbow x-ray 08/25/2022 FINDINGS: There is a new right radial head arthroplasty. Alignment is anatomic. No acute fracture. There is soft tissue swelling surrounding the elbow. External fixator device present with 2 screws in the distal humerus and 2 screws in the mid ulna. IMPRESSION: 1. New radial head arthroplasty and external fixation device present. Alignment is anatomic. Electronically Signed   By: Darliss Cheney M.D.   On: 08/27/2022 23:19   DG Wrist Complete Left  Result Date: 08/27/2022 CLINICAL DATA:  Fracture EXAM: LEFT WRIST - COMPLETE 3+ VIEW COMPARISON:  Right wrist x-ray 08/25/2022 FINDINGS: There is a new ventral sideplate and multiple screws fixating a comminuted distal radius fracture. Alignment is anatomic. There is a new sideplate and screws fixating a distal ulnar diaphyseal fracture. Alignment is anatomic. No dislocation. There is soft tissue swelling surrounding the fractures. IMPRESSION: 1. ORIF distal radial and distal ulnar fractures. Alignment is anatomic. Electronically Signed   By: Darliss Cheney M.D.   On: 08/27/2022 23:16   DG ELBOW COMPLETE RIGHT (3+VIEW)  Result Date: 08/27/2022 CLINICAL DATA:  ORIF proximal radial  fracture. EXAM: RIGHT ELBOW - COMPLETE 3+ VIEW COMPARISON:  August 25, 2022 FINDINGS: Intraoperative fluoroscopic images demonstrate placement of radial head prosthesis replacing the previously demonstrated comminuted radial head fracture. Near anatomic alignment. The humero-ulnar joint appears widened. IMPRESSION: 1. Intraoperative fluoroscopic images from radial head prosthesis placement with near anatomic alignment. 2. Apparent widening of the humero-ulnar joint. Electronically Signed   By: Ted Mcalpine M.D.   On: 08/27/2022 20:40   DG Forearm Left  Result Date: 08/27/2022 CLINICAL DATA:  Open reduction internal fixation distal radius fracture EXAM: LEFT FOREARM - 2 VIEW FLUOROSCOPY TIME:  Fluoroscopy Time:  1 minute 46 seconds Radiation Exposure Index (if provided by the fluoroscopic device): 5.42 mGy Number of Acquired Spot Images: 4 COMPARISON:  08/25/2022 FINDINGS: Multiple intraoperative fluoroscopic spot images are provided without a radiologist present. Plate and screw fixation of the distal left radius fracture. Redemonstrated angulated fracture of the distal left ulna. IMPRESSION: Intraoperative images during ORIF distal left radius fracture. Electronically Signed   By: Minerva Fester  M.D.   On: 08/27/2022 20:39   DG C-Arm 1-60 Min-No Report  Result Date: 08/27/2022 Fluoroscopy was utilized by the requesting physician.  No radiographic interpretation.   DG C-Arm 1-60 Min-No Report  Result Date: 08/27/2022 Fluoroscopy was utilized by the requesting physician.  No radiographic interpretation.   DG C-Arm 1-60 Min-No Report  Result Date: 08/27/2022 Fluoroscopy was utilized by the requesting physician.  No radiographic interpretation.   DG C-Arm 1-60 Min-No Report  Result Date: 08/27/2022 Fluoroscopy was utilized by the requesting physician.  No radiographic interpretation.   DG C-Arm 1-60 Min-No Report  Result Date: 08/27/2022 Fluoroscopy was utilized by the requesting  physician.  No radiographic interpretation.   DG C-Arm 1-60 Min-No Report  Result Date: 08/27/2022 Fluoroscopy was utilized by the requesting physician.  No radiographic interpretation.   CT SHOULDER RIGHT WO CONTRAST  Result Date: 08/25/2022 CLINICAL DATA:  Found down. Proximal right humeral and right scapular fracture. EXAM: CT OF THE UPPER RIGHT EXTREMITY WITHOUT CONTRAST TECHNIQUE: Multidetector CT imaging of the right shoulder was performed according to the standard protocol. RADIATION DOSE REDUCTION: This exam was performed according to the departmental dose-optimization program which includes automated exposure control, adjustment of the mA and/or kV according to patient size and/or use of iterative reconstruction technique. COMPARISON:  Radiographs and chest CT earlier the same date. FINDINGS: Bones/Joint/Cartilage Again demonstrated is an extensively comminuted and displaced fracture of the right humeral neck which involves the articular surface of the humeral head and both tuberosities. This fracture is impacted with anterior displacement and apex anterior angulation. The humeral head remains located. There is a mildly displaced fracture of the anterior inferior glenoid. In addition, there is a mildly displaced fracture involving the coracoid process. The remainder of the right scapula appears intact. The right clavicle and acromioclavicular joint are intact. Moderate size shoulder joint effusion. Ligaments Suboptimally assessed by CT. Muscles and Tendons Soft tissue swelling within the deltoid musculature without focal fluid collection or foreign body. Soft tissues Soft tissue swelling around the shoulder with increased involvement of the right axilla compared with earlier CT. No high-density components to suggest active bleeding. No evidence of foreign body or soft tissue emphysema. IMPRESSION: 1. Extensively comminuted, displaced and angulated fracture of the right humeral head and neck as  described. 2. Fractures of the anterior inferior glenoid rim and coracoid process. 3. Shoulder joint effusion with diffuse soft tissue swelling around the shoulder extending into the right axilla. Electronically Signed   By: Carey Bullocks M.D.   On: 08/25/2022 17:21   CT ELBOW RIGHT WO CONTRAST  Result Date: 08/25/2022 CLINICAL DATA:  Found down. Elbow trauma with fracture dislocation, postreduction. EXAM: CT OF THE UPPER RIGHT EXTREMITY WITHOUT CONTRAST 3-DIMENSIONAL CT IMAGE RENDERING ON ACQUISITION WORKSTATION TECHNIQUE: Multidetector CT imaging of the right elbow was performed according to the standard protocol. 3-dimensional CT images were rendered by post-processing of the original CT data on an acquisition workstation. The 3-dimensional CT images were interpreted and findings were reported in the accompanying complete CT report for this study RADIATION DOSE REDUCTION: This exam was performed according to the departmental dose-optimization program which includes automated exposure control, adjustment of the mA and/or kV according to patient size and/or use of iterative reconstruction technique. COMPARISON:  Radiographs same date. FINDINGS: Bones/Joint/Cartilage Examination was performed with the forearm over the patient's abdomen, limiting bone detail. The elbow and forearm are splinted. The ulnohumeral dislocation has been reduced. There is a small fracture of the coronoid process of the  proximal ulna. There are mildly displaced fractures of the lateral humeral epicondyle. As seen on earlier radiographs, there is an extensively comminuted and displaced fracture of the radial head and neck. The radial head is rotated and no longer articulates with the capitellum. No definite fracture of the articular surface of the capitellum identified. Moderate-sized elbow joint effusion with intra-articular fracture fragments. Limited imaging of the distal forearm demonstrates no other acute osseous abnormalities.  Ligaments Suboptimally assessed by CT. Muscles and Tendons No intramuscular fluid collection identified. The biceps tendon is not well visualized. Soft tissues Soft tissue swelling around the elbow without evidence of foreign body, soft tissue emphysema or focal fluid collection. IMPRESSION: 1. Detail limited by positioning and body habitus. 2. Reduction of the ulnohumeral dislocation with small fracture of the coronoid process. 3. Extensively comminuted fracture of the radial head and neck with persistent dislocation of the radial head. Mildly comminuted fracture of the lateral humeral epicondyle. 4. Surrounding soft tissue swelling and joint effusion without evidence of foreign body. Electronically Signed   By: Carey Bullocks M.D.   On: 08/25/2022 17:12   CT 3D RECON AT SCANNER  Result Date: 08/25/2022 CLINICAL DATA:  Found down. Elbow trauma with fracture dislocation, postreduction. EXAM: CT OF THE UPPER RIGHT EXTREMITY WITHOUT CONTRAST 3-DIMENSIONAL CT IMAGE RENDERING ON ACQUISITION WORKSTATION TECHNIQUE: Multidetector CT imaging of the right elbow was performed according to the standard protocol. 3-dimensional CT images were rendered by post-processing of the original CT data on an acquisition workstation. The 3-dimensional CT images were interpreted and findings were reported in the accompanying complete CT report for this study RADIATION DOSE REDUCTION: This exam was performed according to the departmental dose-optimization program which includes automated exposure control, adjustment of the mA and/or kV according to patient size and/or use of iterative reconstruction technique. COMPARISON:  Radiographs same date. FINDINGS: Bones/Joint/Cartilage Examination was performed with the forearm over the patient's abdomen, limiting bone detail. The elbow and forearm are splinted. The ulnohumeral dislocation has been reduced. There is a small fracture of the coronoid process of the proximal ulna. There are mildly  displaced fractures of the lateral humeral epicondyle. As seen on earlier radiographs, there is an extensively comminuted and displaced fracture of the radial head and neck. The radial head is rotated and no longer articulates with the capitellum. No definite fracture of the articular surface of the capitellum identified. Moderate-sized elbow joint effusion with intra-articular fracture fragments. Limited imaging of the distal forearm demonstrates no other acute osseous abnormalities. Ligaments Suboptimally assessed by CT. Muscles and Tendons No intramuscular fluid collection identified. The biceps tendon is not well visualized. Soft tissues Soft tissue swelling around the elbow without evidence of foreign body, soft tissue emphysema or focal fluid collection. IMPRESSION: 1. Detail limited by positioning and body habitus. 2. Reduction of the ulnohumeral dislocation with small fracture of the coronoid process. 3. Extensively comminuted fracture of the radial head and neck with persistent dislocation of the radial head. Mildly comminuted fracture of the lateral humeral epicondyle. 4. Surrounding soft tissue swelling and joint effusion without evidence of foreign body. Electronically Signed   By: Carey Bullocks M.D.   On: 08/25/2022 17:12   CT HEAD WO CONTRAST ( )  Result Date: 08/25/2022 CLINICAL DATA:  Head trauma, moderate-severe.  Fall down steps. EXAM: CT HEAD WITHOUT CONTRAST TECHNIQUE: Contiguous axial images were obtained from the base of the skull through the vertex without intravenous contrast. RADIATION DOSE REDUCTION: This exam was performed according to the departmental dose-optimization program  which includes automated exposure control, adjustment of the mA and/or kV according to patient size and/or use of iterative reconstruction technique. COMPARISON:  08/25/2022 FINDINGS: Brain: Previously seen subdural hemorrhage along the posterior falx no longer visualized. Previously seen bilateral  subarachnoid hemorrhage no longer visualized. No new hemorrhage. No hydrocephalus or acute infarction. Vascular: No hyperdense vessel or unexpected calcification. Skull: No acute calvarial abnormality. Sinuses/Orbits: No acute findings Other: None IMPRESSION: Areas of subdural and subarachnoid hemorrhage previously noted no longer visualized. No acute intracranial abnormality currently. Electronically Signed   By: Charlett Nose M.D.   On: 08/25/2022 17:05   DG Elbow 2 Views Right  Result Date: 08/25/2022 CLINICAL DATA:  Postreduction right elbow EXAM: RIGHT ELBOW - 2 VIEW COMPARISON:  Right elbow radiographs 08/25/2022; earlier right humerus and forearm radiographs 08/25/2022 FINDINGS: There is new overlying cast material. Redemonstration of comminuted fracture of the distal lateral humerus and markedly comminuted fracture of the proximal radius. There is improved, grossly normal alignment of the previously dislocated humerus-radius and humerus-ulnar articulations. Mild volar displacement and angulation of the fractured radial head on lateral view. On frontal view there is mild obliquity but grossly normal radiocapitellar alignment, within the limitation of the bone loss from the comminuted proximal radial fracture. IMPRESSION: 1. Improved gross alignment of the elbow with resolution of the prior complete dislocation. 2. Improved, grossly normal alignment of the distal humerus with respect to the proximal ulna and radius, however the markedly comminuted proximal radial head still has some mildly displaced components. Electronically Signed   By: Neita Garnet M.D.   On: 08/25/2022 12:45   DG Wrist Complete Left  Result Date: 08/25/2022 CLINICAL DATA:  Postreduction of left wrist EXAM: LEFT WRIST - COMPLETE 3+ VIEW COMPARISON:  Left wrist radiographs 08/25/2022 at 10:02 a.m. and 6:26 a.m. FINDINGS: There is new overlying casting material that limits evaluation of fine bony detail. Redemonstration of distal  radial metaphyseal through the articular surface fracture lines with improved, now 6 mm lateral displacement of the distal fracture component with respect to the proximal fracture component (previously 10 mm). There is improved now appropriate AP alignment of the fracture fragments on lateral view. There is improved alignment of the distal ulnar comminuted, acute, diaphyseal fracture with now approximately 2 mm lateral displacement but resolution of the prior AP displacement of the distal fracture component. IMPRESSION: 1. New overlying casting material. 2. Improved alignment of the distal radial fracture with persistent 6 mm lateral displacement of the distal fracture component. 3. Improved alignment of the distal ulnar comminuted diaphyseal fracture with mild-to-moderate lateral displacement of the distal fracture component. Electronically Signed   By: Neita Garnet M.D.   On: 08/25/2022 12:41   DG Wrist 2 Views Left  Result Date: 08/25/2022 CLINICAL DATA:  Trauma EXAM: LEFT WRIST - 2 VIEW COMPARISON:  Study done earlier today FINDINGS: Severely comminuted fracture is seen in the distal left radius. There is lateral and posterior displacement of distal fracture fragment along with the carpals in relation to the shaft of left radius. Fracture is extending to the articular surface. There is a comminuted fracture in the distal shaft of ulna. There is dorsal displacement of distal major fracture fragment. There is small calcific density adjacent to the distal ulna seen on the AP view suggesting possible recent avulsion. Overall, no significant interval changes are noted in comparison with the previous study. IMPRESSION: Comminuted displaced fractures are seen in distal radius and ulna with no significant interval change. Electronically Signed  By: Ernie Avena M.D.   On: 08/25/2022 10:25   DG Elbow 2 Views Right  Result Date: 08/25/2022 CLINICAL DATA:  Status post reduction. EXAM: RIGHT ELBOW - 2 VIEW  COMPARISON:  Right forearm and right humerus radiographs 08/25/2022; FINDINGS: Persistent posterior dislocation of the radius ulna with respect the distal humerus. On limited views there again fracture distal lateral humerus centered within the lateral epicondyle. Mildly comminuted and mildly displaced proximal radial fracture. Tip of the coronoid process fracture. IMPRESSION: Persistent posterior dislocation of the radius with respect to the distal humerus. Electronically Signed   By: Neita Garnet M.D.   On: 08/25/2022 08:10   DG Forearm Right  Result Date: 08/25/2022 CLINICAL DATA:  71 year old female status post trauma. Fall down a flight of stairs. Right arm deformity. EXAM: RIGHT FOREARM - 2 VIEW COMPARISON:  Right humerus series today. FINDINGS: Posterior fracture dislocation at the right elbow. Proximal right radius appears comminuted with displacement. Proximal right ulna may remain intact. There is evidence of a lateral supracondylar fracture of the distal right humerus. Distal radius and ulna appear intact. Right wrist alignment appears maintained. Carpal bones appear intact and aligned. Metacarpals appear intact. IMPRESSION: 1. Posterior fracture dislocation at the right elbow with comminution of the proximal right radius and fracture also of the adjacent lateral supracondylar humerus. Proximal right ulna probably remains intact. 2. No fracture or dislocation identified at the right wrist. Electronically Signed   By: Odessa Fleming M.D.   On: 08/25/2022 07:41   DG Humerus Right  Result Date: 08/25/2022 CLINICAL DATA:  71 year old female status post trauma. Fall down a flight of stairs. Right arm deformity. EXAM: RIGHT HUMERUS - 2+ VIEW COMPARISON:  CT Chest, Abdomen, and Pelvis today reported separately. FINDINGS: Comminuted and impacted proximal right humerus fracture. Associated mildly displaced right coracoid fracture better demonstrated by CT. Posterior fracture dislocation superimposed at the right  elbow. Proximal radius fracture is apparent. Right humerus shaft remains intact. Soft tissue hematoma and/or contusion at the lateral mid humerus and also at the elbow. IMPRESSION: 1. Posterior fracture dislocation at the right elbow with comminution of the proximal right radius. 2. Comminuted and impacted proximal right humerus fracture with right coracoid fracture better demonstrated by CT. 3. Multifocal right arm soft tissue hematoma/contusion. Electronically Signed   By: Odessa Fleming M.D.   On: 08/25/2022 07:32   DG Pelvis Portable  Result Date: 08/25/2022 CLINICAL DATA:  71 year old female status post trauma. Fall down a flight of stairs. Wrist and shoulder deformities. EXAM: PORTABLE PELVIS 1-2 VIEWS COMPARISON:  CT Chest, Abdomen, and Pelvis today reported separately. FINDINGS: Portable AP view at 0518 hours. Femoral heads normally located. Pelvis appears intact. Bone mineralization is within normal limits for age. Grossly intact proximal femurs. Negative visible bowel gas. Incidental pelvic phleboliths. IMPRESSION: No acute fracture or dislocation identified about the pelvis. Electronically Signed   By: Odessa Fleming M.D.   On: 08/25/2022 07:30   DG Chest Port 1 View  Result Date: 08/25/2022 CLINICAL DATA:  71 year old female status post trauma. Fall down a flight of stairs. Wrist and shoulder deformities. EXAM: PORTABLE CHEST 1 VIEW COMPARISON:  CT Chest, Abdomen, and Pelvis today reported separately. FINDINGS: Portable AP view at 0511 hours. Proximal right humerus and coracoid fracture better demonstrated by CT. Lung volumes and mediastinal contours are within normal limits. Visualized tracheal air column is within normal limits. Allowing for portable technique the lungs are clear. IMPRESSION: 1. No acute cardiopulmonary abnormality. 2. Proximal right humerus  and coracoid fractures better demonstrated by CT. Electronically Signed   By: Odessa Fleming M.D.   On: 08/25/2022 07:29   DG Wrist Complete Left  Result  Date: 08/25/2022 CLINICAL DATA:  71 year old female status post trauma. Fall down a flight of stairs. Wrist and shoulder deformities. EXAM: LEFT WRIST - COMPLETE 3+ VIEW COMPARISON:  None Available. FINDINGS: AP, oblique, and cross-table lateral views of the left wrist demonstrate comminuted fractures of both the distal left radius and ulna. Oblique and comminuted distal ulna shaft transverse fracture with 1/2 shaft width ulnar and 1 full shaft width dorsal displacement. Ulnar styloid fracture superimposed. Highly impacted distal left radius metadiaphysis fracture with DRU and radiocarpal joint involvement. One full shaft width dorsal displacement and over-riding. No definite superimposed carpal fracture or dislocation. Metacarpals appear intact. IMPRESSION: Comminuted and displaced fractures of both the distal left radius and ulna. Highly impacted distal radius fracture with DRU and radiocarpal joint involvement. Electronically Signed   By: Odessa Fleming M.D.   On: 08/25/2022 07:28   CT CHEST ABDOMEN PELVIS W CONTRAST  Result Date: 08/25/2022 CLINICAL DATA:  71 year old female status post trauma. Fall down a flight of stairs. EXAM: CT CHEST, ABDOMEN, AND PELVIS WITH CONTRAST TECHNIQUE: Multidetector CT imaging of the chest, abdomen and pelvis was performed following the standard protocol during bolus administration of intravenous contrast. RADIATION DOSE REDUCTION: This exam was performed according to the departmental dose-optimization program which includes automated exposure control, adjustment of the mA and/or kV according to patient size and/or use of iterative reconstruction technique. CONTRAST:  OMNIPAQUE IOHEXOL 300 MG/ML  SOLN COMPARISON:  CT cervical spine. FINDINGS: CT CHEST FINDINGS Cardiovascular: Mildly tortuous but intact thoracic aorta. No cardiomegaly or pericardial effusion. Mild for age atherosclerosis. Mediastinum/Nodes: Negative. No mediastinal hematoma, lymphadenopathy. Lungs/Pleura: Major  airways are patent. Normal lung volumes. Minor lung base atelectasis or scarring. No pneumothorax. No pleural effusion. No pulmonary contusion. Musculoskeletal: Comminuted and impacted fracture of the right humerus head and neck. Associated mildly displaced fracture of the right scapula coracoid (series 5, image 111). Elsewhere the visible right scapula and clavicle appear intact. Contralateral left shoulder osseous structures appear intact. No rib or thoracic vertebral fracture identified. CT ABDOMEN PELVIS FINDINGS Hepatobiliary: Liver and gallbladder appear intact with no perihepatic free fluid. Small oval nonenhancing cyst of the liver dome measures 7 mm and appears unchanged on delayed images (no follow-up imaging recommended). Pancreas: Negative. Spleen: Spleen appears intact with no perisplenic fluid. Adrenals/Urinary Tract: Intact, negative. Stomach/Bowel: Large bowel appears negative except for retained stool. Normal appendix on series 3, image 99. No dilated small bowel. No free air or free fluid. There is a 3.4 cm diverticulum of the proximal duodenum series 3, image 72. No active inflammation. Stomach and duodenum otherwise appear negative. Vascular/Lymphatic: Mild Calcified aortic atherosclerosis. Major arterial structures in the abdomen and pelvis are patent. No lymphadenopathy. Portal venous system appears grossly patent on delayed images. Reproductive: Negative. Other: No pelvic free fluid.  Incidental pelvic phleboliths. Musculoskeletal: Normal lumbar segmentation. Mild grade 1 anterolisthesis of L4 on L5 appears to be degenerative with moderate facet arthropathy at both levels. Ununited L1 left transverse process ossification center, normal variant. Lumbar vertebrae, sacrum, SI joints, pelvis, and proximal femurs appear intact. IMPRESSION: 1. Comminuted and impacted fracture of the proximal right humerus, with associated mildly displaced fracture of the right scapula coracoid process. 2. No other  acute traumatic injury identified in the chest, abdomen, or pelvis. Electronically Signed   By: Odessa Fleming  M.D.   On: 08/25/2022 06:25   CT MAXILLOFACIAL WO CONTRAST  Result Date: 08/25/2022 CLINICAL DATA:  71 year old female status post trauma. Fall down a flight of stairs. EXAM: CT MAXILLOFACIAL WITHOUT CONTRAST TECHNIQUE: Multidetector CT imaging of the maxillofacial structures was performed. Multiplanar CT image reconstructions were also generated. RADIATION DOSE REDUCTION: This exam was performed according to the departmental dose-optimization program which includes automated exposure control, adjustment of the mA and/or kV according to patient size and/or use of iterative reconstruction technique. COMPARISON:  CT head and cervical spine today reported separately. FINDINGS: Osseous: Evidence of chronic left TMJ degeneration. Mandible appears intact. Bilateral maxilla, pterygoid, zygoma, and nasal bones appear intact. No acute dental finding identified. Central skull base appears intact. Orbits: Intact bilateral orbital walls. Globes and intraorbital soft tissues appears symmetric and normal. Sinuses: Tympanic cavities, Visualized paranasal sinuses and mastoids are clear. Soft tissues: Right lateral and supraorbital scalp hematoma is mild and was also visible on head CT. No superficial match that no soft tissue gas identified. Negative visible noncontrast deep soft tissue spaces of the face. Limited intracranial: Reported separately. IMPRESSION: 1. No Face fracture identified.  Left TMJ degeneration. 2. Mild right lateral, supraorbital scalp hematoma. 3. Head and Cervical spine detailed separately. Electronically Signed   By: Odessa Fleming M.D.   On: 08/25/2022 06:17   CT CERVICAL SPINE WO CONTRAST  Result Date: 08/25/2022 CLINICAL DATA:  71 year old female status post trauma. Fall down a flight of stairs. EXAM: CT CERVICAL SPINE WITHOUT CONTRAST TECHNIQUE: Multidetector CT imaging of the cervical spine was  performed without intravenous contrast. Multiplanar CT image reconstructions were also generated. RADIATION DOSE REDUCTION: This exam was performed according to the departmental dose-optimization program which includes automated exposure control, adjustment of the mA and/or kV according to patient size and/or use of iterative reconstruction technique. COMPARISON:  CT head and face today.  Chest CT today. FINDINGS: Alignment: Relatively maintained cervical lordosis. Cervicothoracic junction alignment is within normal limits. Bilateral posterior element alignment is within normal limits. Skull base and vertebrae: Visualized skull base is intact. No atlanto-occipital dissociation. Congenital incomplete ossification of the posterior C1 ring. C1 and C2 otherwise appear intact and aligned. No acute osseous abnormality identified. Soft tissues and spinal canal: No prevertebral fluid or swelling. No visible canal hematoma. Negative visible noncontrast neck soft tissues. Disc levels: Advanced chronic disc and endplate degeneration at C6-C7, but mild for age elsewhere. Doubt significant spinal stenosis. There is moderate to severe bilateral C7 foraminal stenosis. Multilevel cervical facet degeneration mostly on the left side. Upper chest: Negative, and chest CT is reported separately. IMPRESSION: 1. No acute traumatic injury identified in the cervical spine. 2. 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 Electronically Signed   By: Odessa Fleming M.D.   On: 08/25/2022 06:13   CT HEAD WO CONTRAST ( )  Addendum Date: 08/25/2022   ADDENDUM REPORT: 08/25/2022 05:58 ADDENDUM: Study discussed by telephone with Dr. Kennis Carina on 08/25/2022 at 0557 hours. Electronically Signed   By: Odessa Fleming M.D.   On: 08/25/2022 05:58   Result Date: 08/25/2022 CLINICAL DATA:  71 year old female status post trauma. Fall down a flight of stairs. EXAM: CT HEAD WITHOUT CONTRAST TECHNIQUE:  Contiguous axial images were obtained from the base of the skull through the vertex without intravenous contrast. RADIATION DOSE REDUCTION: This exam was performed according to the departmental dose-optimization program which includes automated exposure control, adjustment of the mA and/or kV according to patient size  and/or use of iterative reconstruction technique. COMPARISON:  CT face and cervical spine today reported separately. FINDINGS: Brain: Incidental dural calcification of the prepontine cistern. Basilar cisterns are normal. Small volume posterior para falcine subdural blood along the falx (series 3, image 24). There is also evidence of trace left hemisphere subdural blood on coronal image 35, 1-2 mm only. Trace bilateral subarachnoid blood over the superior convexities. No IVH or ventriculomegaly. No cerebral hemorrhagic contusion identified. Gray-white matter differentiation appears within normal limits for age. No ventriculomegaly. No cortically based acute infarct identified. No intracranial mass effect or midline shift. Vascular: Calcified atherosclerosis at the skull base. Skull: No skull fracture identified. Facial bones and cervical spine detailed separately. Sinuses/Orbits: Visualized paranasal sinuses and mastoids are clear. Other: Right anterior frontal convexity scalp hematoma best seen on coronal image 18, up to 5 mm in thickness. Underlying calvarium appears intact. No other discrete orbit or scalp soft tissue injury identified. Face CT is reported separately. Proximal right humerus fracture visible on the scout view. IMPRESSION: 1. Trace left side and para falcine Subdural Hematoma. Trace bilateral Subarachnoid Blood. No cerebral hemorrhagic contusion or intracranial mass effect. 2. Right scalp soft tissue injury with no skull fracture identified. Facial bones and cervical spine detailed separately. 3. Proximal Right Humerus Fracture visible on the scout view. Electronically Signed: By: Odessa Fleming M.D. On: 08/25/2022 05:54    EKG:No orders found for this or any previous visit.    Physical exam: Constitutional: General Appearance: healthy-appearing, well-nourished, and well-developed. Level of Distress: no acute distress. Eyes: Lens (normal) clear: both eyes. Head: Head: normocephalic and atraumatic. Lungs: Respiratory effort: no dyspnea. Skin: Inspection and palpation: no rash, lesions Neurologic: Cranial Nerves: grossly intact. Sensation: grossly intact. Psychiatric: Insight: good judgement and insight. Mental Status: normal mood and affect and active and alert. Orientation: to time, place, and person.  Right upper extremity: Clean dry intact Aquacel to the right shoulder.  Clean intact dry Aquacel to the right elbow.  Kerlix around external fixator of the right elbow.  Able to move hand and digits without difficulty.  Neurovascular intact distally.  Ace wrap from the wrist to the elbow.  Hospital Course: AMANDALYNN PITZ is a 71 y.o. who was admitted to Hospital. They were brought to the operating room on 09/10/2022 and underwent Procedure(s): REVERSE SHOULDER ARTHROPLASTY.  Patient tolerated the procedure well and was later transferred to the recovery room and then to the orthopaedic floor for postoperative care.  They were given PO and IV analgesics for pain control following their surgery.  They were given 24 hours of postoperative antibiotics of  Anti-infectives (From admission, onward)    Start     Dose/Rate Route Frequency Ordered Stop   09/10/22 1830  ceFAZolin (ANCEF) IVPB 2g/100 mL premix        2 g 200 mL/hr over 30 Minutes Intravenous Every 6 hours 09/10/22 1718 09/11/22 0718   09/10/22 1331  vancomycin (VANCOCIN) powder  Status:  Discontinued          As needed 09/10/22 1331 09/10/22 1718   09/10/22 1015  ceFAZolin (ANCEF) IVPB 2g/100 mL premix        2 g 200 mL/hr over 30 Minutes Intravenous On call to O.R. 09/10/22 1002 09/10/22 1313   09/10/22 1015   ceFAZolin (ANCEF) IVPB 2g/100 mL premix  Status:  Discontinued        2 g 200 mL/hr over 30 Minutes Intravenous On call to O.R. 09/10/22 1002 09/10/22 1019  and started on DVT prophylaxis in the form of Lovenox. Marland Kitchen  Discharge planning consulted to help with postop disposition and equipment needs.  Patient had an uneventful night on the evening of surgery.  They started to get up OOB with therapy on day one. the patient had progressed well and meeting their goals.  Incision was healing well.  Patient was seen in rounds and was ready to go home.   Diet: Regular diet Activity:NWB RUE.  Follow-up:in 2 weeks Disposition - Home Discharged Condition: good   Discharge Instructions     Call MD / Call 911   Complete by: As directed    If you experience chest pain or shortness of breath, CALL 911 and be transported to the hospital emergency room.  If you develope a fever above 101 F, pus (white drainage) or increased drainage or redness at the wound, or calf pain, call your surgeon's office.   Constipation Prevention   Complete by: As directed    Drink plenty of fluids.  Prune juice may be helpful.  You may use a stool softener, such as Colace (over the counter) 100 mg twice a day.  Use MiraLax (over the counter) for constipation as needed.   Diet - low sodium heart healthy   Complete by: As directed    Increase activity slowly as tolerated   Complete by: As directed    Post-operative opioid taper instructions:   Complete by: As directed    POST-OPERATIVE OPIOID TAPER INSTRUCTIONS: It is important to wean off of your opioid medication as soon as possible. If you do not need pain medication after your surgery it is ok to stop day one. Opioids include: Codeine, Hydrocodone(Norco, Vicodin), Oxycodone(Percocet, oxycontin) and hydromorphone amongst others.  Long term and even short term use of opiods can cause: Increased pain response Dependence Constipation Depression Respiratory  depression And more.  Withdrawal symptoms can include Flu like symptoms Nausea, vomiting And more Techniques to manage these symptoms Hydrate well Eat regular healthy meals Stay active Use relaxation techniques(deep breathing, meditating, yoga) Do Not substitute Alcohol to help with tapering If you have been on opioids for less than two weeks and do not have pain than it is ok to stop all together.  Plan to wean off of opioids This plan should start within one week post op of your joint replacement. Maintain the same interval or time between taking each dose and first decrease the dose.  Cut the total daily intake of opioids by one tablet each day Next start to increase the time between doses. The last dose that should be eliminated is the evening dose.         Allergies as of 09/11/2022       Reactions   Ace Inhibitors Cough        Medication List     TAKE these medications    acetaminophen 325 MG tablet Commonly known as: TYLENOL Take 2 tablets (650 mg total) by mouth 4 (four) times daily -  with meals and at bedtime.   ascorbic acid 500 MG tablet Commonly known as: VITAMIN C Take 1 tablet (500 mg total) by mouth daily.   atorvastatin 20 MG tablet Commonly known as: LIPITOR Take 20 mg by mouth daily.   docusate sodium 100 MG capsule Commonly known as: COLACE Take 1 capsule (100 mg total) by mouth 2 (two) times daily.   DULoxetine 60 MG capsule Commonly known as: CYMBALTA Take 60 mg by mouth daily.   ferrous  sulfate 325 (65 FE) MG tablet Take 1 tablet (325 mg total) by mouth daily.   gabapentin 300 MG capsule Commonly known as: NEURONTIN Take 600 mg by mouth at bedtime.   levothyroxine 50 MCG tablet Commonly known as: SYNTHROID Take 50 mcg by mouth daily.   losartan 100 MG tablet Commonly known as: COZAAR Take 100 mg by mouth daily.   melatonin 5 MG Tabs Take 1 tablet (5 mg total) by mouth at bedtime as needed.   Methocarbamol 1000 MG  Tabs Take 1,000 mg by mouth every 8 (eight) hours.   Oxycodone HCl 10 MG Tabs Take 1 tablet (10 mg total) by mouth 3 (three) times daily as needed. What changed:  when to take this reasons to take this   pantoprazole 40 MG tablet Commonly known as: PROTONIX Take 1 tablet (40 mg total) by mouth daily.   polyethylene glycol 17 g packet Commonly known as: MIRALAX / GLYCOLAX Take 17 g by mouth daily.   propranolol 10 MG tablet Commonly known as: INDERAL Take 10 mg by mouth 2 (two) times daily.   SYSTANE OP Place 1-2 drops into both eyes daily as needed (Dry eyes).   trolamine salicylate 10 % cream Commonly known as: ASPERCREME Apply topically 2 (two) times daily as needed for muscle pain.   Vitamin D (Ergocalciferol) 1.25 MG (50000 UNIT) Caps capsule Commonly known as: DRISDOL Take 1 capsule (50,000 Units total) by mouth every 7 (seven) days.   vitamin D3 25 MCG tablet Commonly known as: CHOLECALCIFEROL Take 1 tablet (1,000 Units total) by mouth daily.   zaleplon 10 MG capsule Commonly known as: SONATA Take 10 mg by mouth at bedtime.         Signed: Jonelle Sidle PA-C  Orthopaedic Surgery 09/11/2022, 11:58 AM

## 2022-09-11 NOTE — Discharge Instructions (Addendum)
Orthopedic discharge instructions:  Maintain bandages to the elbow and shoulder until follow-up.  Can change with a dry dressing if becomes saturated or falls off.  Weightbearing: No weightbearing through the right arm.  Motion: Avoid active shoulder motion, okay to move the digits and wrist.  Elbow restrictions per [facility].  Pain control: Take ibuprofen and Tylenol around-the-clock for pain control.  Take oxycodone as prescribed by Dr. Tessa Lerner for severe pain as needed.  Call Dr[facility] for pain medication refills.  Follow-up with Reba Hulett PA-C at [facility] in 2 weeks.  Call [phone] questions or concerns and to make an appointment  Follow-up with Dr. [facility] next week for suture removal.  Call to make appointment.  Typically his appointments are on Wednesdays.

## 2022-09-14 LAB — BPAM RBC
Blood Product Expiration Date: 202310102359
Blood Product Expiration Date: 202310102359
ISSUE DATE / TIME: 202309281450
Unit Type and Rh: 7300
Unit Type and Rh: 7300

## 2022-09-14 LAB — TYPE AND SCREEN
ABO/RH(D): B POS
Antibody Screen: NEGATIVE
Unit division: 0
Unit division: 0

## 2022-09-18 NOTE — H&P (Signed)
ORTHOPAEDIC CONSULTATION   REQUESTING PHYSICIAN: Md, Trauma, MD   PCP:  Richardean Chimera, MD   Chief Complaint: Right proximal humerus fracture   HPI: Vanessa [name] is a 71 y.o. female who complains of Right upper extremity pain following a fall during a nighttime bathroom break where she was found at the bottom of 12-14 stairs.  She had immediate deformity of right upper extremity as well as pain in the left upper extremity.  She has had management by the trauma service as well as fracture surgery already by my orthopedic trauma colleague Dr. Daneil Dolin.  I was asked to evaluate the patient's right proximal humerus fracture for potential need of arthroplasty given the degree of comminution.  She is currently complaining of shoulder pain but is managed well.  She denies numbness or tingling.  She is right-hand-dominant.  She denies smoking or diabetes.  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       Past Medical History:  Diagnosis Date   Anxiety     Arthritis     Depression     Hypertension           Past Surgical History:  Procedure Laterality Date   FRACTURE SURGERY        Social History         Socioeconomic History   Marital status: Married      Spouse name: Not on file   Number of children: Not on file   Years of education: Not on file   Highest education level: Not on file  Occupational History   Not on file  Tobacco Use   Smoking status: Never   Smokeless tobacco: Never  Substance and Sexual Activity   Alcohol use: No      Alcohol/week: 0.0 standard drinks of alcohol   Drug use: No   Sexual activity: Yes      Birth control/protection: None  Other Topics Concern   Not on file  Social History Narrative   Not on file    Social Determinants of Health    Financial Resource Strain: Not on file  Food Insecurity: Not on file  Transportation Needs: Not on file  Physical Activity: Not on file  Stress: Not on file  Social Connections:  Not on file         Family History  Problem Relation Age of Onset   Cancer Mother          Allergies  Allergen Reactions   Ace Inhibitors Cough           Prior to Admission medications   Medication Sig Start Date End Date Taking? Authorizing Provider  atorvastatin (LIPITOR) 20 MG tablet Take 20 mg by mouth daily. 05/29/22   Yes [provider]  DULoxetine (CYMBALTA) 60 MG capsule Take 60 mg by mouth daily. 08/02/22   Yes [provider]  famotidine (PEPCID) 40 MG tablet Take 40 mg by mouth daily. 05/29/22   Yes [provider]  gabapentin (NEURONTIN) 300 MG capsule Take 600 mg by mouth at bedtime. 05/29/22   Yes [provider]  levothyroxine (SYNTHROID) 50 MCG tablet Take 50 mcg by mouth daily. 05/29/22   Yes [provider]  losartan (COZAAR) 100 MG tablet Take 100 mg by mouth daily. 05/29/22   Yes [provider]  OVER THE COUNTER MEDICATION Apply 1 Application topically as needed (For back pain).     Yes [provider]  Polyethyl Glycol-Propyl Glycol (SYSTANE OP) Place 1-2 drops into both eyes daily as needed (Dry eyes).     Yes [provider]  propranolol (INDERAL) 10 MG tablet Take 10 mg by mouth 2 (two) times daily. 05/29/22   Yes [provider]  zaleplon (SONATA) 10 MG capsule Take 10 mg by mouth at bedtime. 08/03/22   Yes [provider]     Imaging Results (Last 48 hours)  CT ELBOW RIGHT WO CONTRAST   Result Date: 08/30/2022 CLINICAL DATA:  Fracture, elbow posterior radial head arthroplasty with persistent instability. EXAM: CT OF THE UPPER RIGHT EXTREMITY WITHOUT CONTRAST TECHNIQUE: Multidetector CT imaging of the upper right extremity was performed according to the standard protocol. RADIATION DOSE REDUCTION: This exam was performed according to the departmental dose-optimization program which includes automated exposure control, adjustment of the mA and/or kV according to patient size  and/or use of iterative reconstruction technique. COMPARISON:  08/27/2022, 08/25/2022. FINDINGS: Bones/Joint/Cartilage Examination is limited due to motion artifact. Radial head arthroplasty changes are noted. There is redemonstration of fracture fragments at the lateral humeral condyle, unchanged from 08/25/2022. No dislocation at the elbow. External fixation hardware is noted in the humerus and ulna without evidence of hardware loosening. No joint effusion. Ligaments Suboptimally assessed by CT. Muscles and Tendons No acute abnormality. Soft tissues Subcutaneous fat stranding and edema are noted in the right lower extremity. Fluid is noted posterior to the elbow and proximal forearm. A tiny focus of subcutaneous air is noted along the posterior aspect of the proximal ulna, which was present on 08/17/2022 and likely related to recent surgery. IMPRESSION: 1. Limited evaluation due to patient's body habitus and motion artifact. 2. Status post radial head arthroplasty with no evidence of dislocation. 3. Nondisplaced fracture of the lateral humeral condyle, which was seen 08/25/2022. 4. Subcutaneous fat stranding, fluid, and foci of air along the posterior aspect of the proximal forearm, likely related to recent surgery. Electronically Signed   By: Thornell Sartorius M.D.   On: 08/30/2022 22:50      Positive ROS: All other systems have been reviewed and were otherwise negative with the exception of those mentioned in the HPI and as above.   Physical Exam: General: Alert, no acute distress Cardiovascular: No pedal edema Respiratory: No cyanosis, no use of accessory musculature GI: No organomegaly, abdomen is soft and non-tender Skin: No lesions in the area of chief complaint Neurologic: Sensation intact distally Psychiatric: Patient is competent for consent with normal mood and affect Lymphatic: No axillary or cervical lymphadenopathy   MUSCULOSKELETAL: Right upper extremity is in a sling and she has an  external fixator on the elbow.  Pin sites are clean but wrapped with Ace bandage.  Distally neurovascular intact in the hand.  At the shoulder sensation intact and motor intact in the axillary nerve.   Assessment: 1/Comminuted right four-part proximal humerus fracture with head splitting involvement 2. Inferior glenoid rim fracture, right closed.   Plan: - Together along with her family at the bedside we discussed the nature of her right proximal humerus fracture.  This is a significant injury and that involves the humeral head as well as significant displacement and involvement of the proximal humerus tuberosities.  She also has a inferior fracture of the glenoid which I do believe will not disrupt recommendation for arthroplasty.  She would be in need of a reverse arthroplasty given the involvement of the tuberosities.  We discussed risk and benefits of the procedure as well as the  recommendation to wait approximately 2 weeks to allow soft tissue recovery and wounds to heal from the previous surgery already performed.  We will tentatively get her scheduled for September 28 which is a Thursday over at [facility].  If need be we can check availability at [facility] but in terms of scheduling with me that would be the easier option to have her at [facility].  She may discharge home first and come back for that surgery if necessary.  We will follow along and see how she progresses on the floor.   - We discussed all risk and benefits of the procedure including but not limited to bleeding, infection, damage to surrounding nerves and vessels, dislocation, fracture, need for revision surgery, persistent pain and weakness as well as stiffness and the risk of anesthesia.  She has provided informed consent.       Nicholes Stairs, MD Cell [phone]      08/31/2022 12:29 AM

## 2022-10-12 ENCOUNTER — Encounter (HOSPITAL_COMMUNITY): Payer: Self-pay | Admitting: Orthopedic Surgery

## 2022-10-12 ENCOUNTER — Other Ambulatory Visit: Payer: Self-pay

## 2022-10-12 NOTE — H&P (Signed)
Orthopaedic Trauma Service (OTS) Consult   Patient ID: Vanessa [name] MRN: [id number] DOB/AGE: 08-02-1951 71 y.o.    HPI: Vanessa [name] is an 71 y.o. female who sustained a complex right elbow fracture dislocation beginning of September 2023.  Patient was treated with right radial head arthroplasty along with external fixation of right elbow due to persistent instability.  Patient has been in the external fixator since 08/27/2022.  She presents today for removal of her external fixator.  Past Medical History:  Diagnosis Date   Anxiety    Arthritis    Bladder prolapse, female, acquired    COVID-19 03/2022   Depression    GERD (gastroesophageal reflux disease)    Hypertension    Hypothyroidism    Myalgia    Occasional tremors    OSA (obstructive sleep apnea)    mild   Restless leg syndrome    Thrombocytopenia ([place]) in the past     Past Surgical History:  Procedure Laterality Date   ELBOW LIGAMENT RECONSTRUCTION Right 08/27/2022   Procedure: ELBOW LIGAMENT RECONSTRUCTION;  Surgeon: Altamese Eclectic, MD;  Location: [place];  Service: Orthopedics;  Laterality: Right;   FRACTURE SURGERY  2011   left ankle Fx s/p fixation   KNEE CARTILAGE SURGERY Right    OPEN REDUCTION INTERNAL FIXATION (ORIF) DISTAL RADIAL FRACTURE Left 08/27/2022   Procedure: OPEN REDUCTION INTERNAL FIXATION (ORIF) DISTAL RADIUS FRACTURE;  Surgeon: Altamese Arimo, MD;  Location: [place];  Service: Orthopedics;  Laterality: Left;   ORIF ULNAR FRACTURE Left 08/27/2022   Procedure: OPEN REDUCTION INTERNAL FIXATION (ORIF) ULNAR FRACTURE;  Surgeon: Altamese Battlement [place], MD;  Location: [place];  Service: Orthopedics;  Laterality: Left;   RADIAL HEAD ARTHROPLASTY Right 08/27/2022   Procedure: RADIAL HEAD ARTHROPLASTY;  Surgeon: Altamese Glen Raven, MD;  Location: [place];  Service: Orthopedics;  Laterality: Right;   REVERSE SHOULDER ARTHROPLASTY Right 09/10/2022   Procedure: REVERSE SHOULDER ARTHROPLASTY;  Surgeon:  Nicholes Stairs, MD;  Location: [facility];  Service: Orthopedics;  Laterality: Right;   TONSILLECTOMY      Family History  Problem Relation Age of Onset   Cancer Mother     Social History:  reports that she has never smoked. She has never used smokeless tobacco. She reports that she does not drink alcohol and does not use drugs.  Allergies:  Allergies  Allergen Reactions   Ace Inhibitors Cough    Medications: I have reviewed the patient's current medications. Current Meds  Medication Sig   acetaminophen (TYLENOL) 500 MG tablet Take 1,500 mg by mouth 2 (two) times daily as needed for moderate pain.   atorvastatin (LIPITOR) 20 MG tablet Take 20 mg by mouth at bedtime.   Cholecalciferol (DIALYVITE VITAMIN D 5000) 125 MCG (5000 UT) capsule Take 5,000 Units by mouth daily.   DULoxetine (CYMBALTA) 60 MG capsule Take 60 mg by mouth daily.   famotidine (PEPCID) 40 MG tablet Take 40 mg by mouth at bedtime.   gabapentin (NEURONTIN) 300 MG capsule Take 600 mg by mouth at bedtime.   levothyroxine (SYNTHROID) 50 MCG tablet Take 50 mcg by mouth daily.   loperamide (ANTI-DIARRHEAL) 2 MG tablet Take 2 mg by mouth every 3 (three) days.   losartan (COZAAR) 100 MG tablet Take 50 mg by mouth 2 (two) times daily.   Melatonin 10 MG TABS Take 10 mg by mouth at bedtime.   Menaquinone-7 (VITAMIN K2 PO) Take 1 tablet by mouth daily.   methocarbamol (ROBAXIN)  500 MG tablet Take 500 mg by mouth 2 (two) times daily.   Oxycodone HCl 10 MG TABS Take 1 tablet (10 mg total) by mouth 3 (three) times daily as needed.   propranolol (INDERAL) 10 MG tablet Take 10 mg by mouth 2 (two) times daily.   zaleplon (SONATA) 10 MG capsule Take 10 mg by mouth at bedtime.     No results found for this or any previous visit (from the past 48 hour(s)).  No results found.  Intake/Output    None      ROS As above  Height 5\' 6"  (1.676 m), weight 90.7 kg. Physical Exam Vitals reviewed.  Constitutional:       General: She is not in acute distress.    Appearance: Normal appearance.  HENT:     Head: Normocephalic.     Mouth/Throat:     Mouth: Mucous membranes are moist.  Eyes:     Extraocular Movements: Extraocular movements intact.  Cardiovascular:     Rate and Rhythm: Normal rate and regular rhythm.  Pulmonary:     Effort: Pulmonary effort is normal.  Musculoskeletal:     Comments: Right Upper Extremity  External fixator is stable Motor and sensory functions intact Surgical wounds healed Ext warm  + radial pulse   Skin:    General: Skin is warm.     Capillary Refill: Capillary refill takes less than 2 seconds.  Neurological:     General: No focal deficit present.     Mental Status: She is alert and oriented to person, place, and time.  Psychiatric:        Mood and Affect: Mood normal.        Behavior: Behavior normal.        Thought Content: Thought content normal.        Judgment: Judgment normal.      Assessment/Plan:  71 year old female polytrauma after falling downstairs including a complex right elbow fracture dislocation s/p repair including right radial head arthroplasty and external fixation right elbow  -Right elbow fracture dislocation with retained external fixator  OR for removal of external fixator  Outpatient procedure   Begin outpatient therapy after postoperative follow-up  Risks and benefits of surgery reviewed with the patient she wishes to proceed.  80, PA-C [id number] (C) 10/12/2022, 11:01 AM  [facility] [address] [place] [id number] [phone] (F)    After 5pm and on the weekends please log on to Amion, go to orthopaedics and the look under the [facility] Call for the provider(s) on call. You can also call our office at [phone] and then follow the prompts to be connected to the call team.

## 2022-10-12 NOTE — Progress Notes (Signed)
PCP - Rudene Anda, MD Cardiologist - Denies  Chest x-ray - 08/25/22 EKG - DOS  CPAP - had study done but was not severe enough to require a cpap  ERAS Protcol - Clears until 0500  Anesthesia review: N  Patient verbally denies any shortness of breath, fever, cough and chest pain during phone call   -------------  SDW INSTRUCTIONS given:  Your procedure is scheduled on 10/13/22.  Report to [facility] Main Entrance "A" at 0530 A.M., and check in at the Admitting office.  Call this number if you have problems the morning of surgery:  [phone]   Remember:  Do not eat after midnight the night before your surgery  You may drink clear liquids until 0500 the morning of your surgery.   Clear liquids allowed are: Water, Non-Citrus Juices (without pulp), Carbonated Beverages, Clear Tea, Black Coffee Only, and Gatorade    Take these medicines the morning of surgery with A SIP OF WATER  DULoxetine (CYMBALTA) levothyroxine (SYNTHROID)  methocarbamol (ROBAXIN) propranolol (INDERAL) acetaminophen (TYLENOL)-if needed Oxycodone -if needed   As of today, STOP taking any Aspirin (unless otherwise instructed by your surgeon) Aleve, Naproxen, Ibuprofen, Motrin, Advil, Goody's, BC's, all herbal medications, fish oil, and all vitamins.                      Do not wear jewelry, make up, or nail polish            Do not wear lotions, powders, perfumes/colognes, or deodorant.            Do not shave 48 hours prior to surgery.  Men may shave face and neck.            Do not bring valuables to the hospital.            [facility] is not responsible for any belongings or valuables.  Do NOT Smoke (Tobacco/Vaping) 24 hours prior to your procedure If you use a CPAP at night, you may bring all equipment for your overnight stay.   Contacts, glasses, dentures or bridgework may not be worn into surgery.      For patients admitted to the hospital, discharge time will be determined by your treatment  team.   Patients discharged the day of surgery will not be allowed to drive home, and someone needs to stay with them for 24 hours.    Special instructions:   [place]- Preparing For Surgery  Before surgery, you can play an important role. Because skin is not sterile, your skin needs to be as free of germs as possible. You can reduce the number of germs on your skin by washing with CHG (chlorahexidine gluconate) Soap before surgery.  CHG is an antiseptic cleaner which kills germs and bonds with the skin to continue killing germs even after washing.    Oral Hygiene is also important to reduce your risk of infection.  Remember - BRUSH YOUR TEETH THE MORNING OF SURGERY WITH YOUR REGULAR TOOTHPASTE  Please do not use if you have an allergy to CHG or antibacterial soaps. If your skin becomes reddened/irritated stop using the CHG.  Do not shave (including legs and underarms) for at least 48 hours prior to first CHG shower. It is OK to shave your face.  Please follow these instructions carefully.   Shower the NIGHT BEFORE SURGERY and the MORNING OF SURGERY with DIAL Soap.   Pat yourself dry with a CLEAN TOWEL.  [place]  to bed the night before surgery  Place CLEAN SHEETS on your bed the night of your first shower and DO NOT SLEEP WITH PETS.   Day of Surgery: Please shower morning of surgery  Wear Clean/Comfortable clothing the morning of surgery Do not apply any deodorants/lotions.   Remember to brush your teeth WITH YOUR REGULAR TOOTHPASTE.   Questions were answered. Patient verbalized understanding of instructions.

## 2022-10-13 ENCOUNTER — Ambulatory Visit (HOSPITAL_BASED_OUTPATIENT_CLINIC_OR_DEPARTMENT_OTHER): Payer: Medicare Other | Admitting: Certified Registered"

## 2022-10-13 ENCOUNTER — Ambulatory Visit (HOSPITAL_COMMUNITY): Payer: Medicare Other

## 2022-10-13 ENCOUNTER — Ambulatory Visit (HOSPITAL_COMMUNITY): Payer: Medicare Other | Admitting: Certified Registered"

## 2022-10-13 ENCOUNTER — Encounter (HOSPITAL_COMMUNITY): Payer: Self-pay | Admitting: Orthopedic Surgery

## 2022-10-13 ENCOUNTER — Other Ambulatory Visit: Payer: Self-pay

## 2022-10-13 ENCOUNTER — Encounter (HOSPITAL_COMMUNITY)
Admission: RE | Disposition: A | Discharge status: Home / Self Care | Payer: Self-pay | Source: Home / Self Care | Attending: Orthopedic Surgery

## 2022-10-13 ENCOUNTER — Ambulatory Visit (HOSPITAL_COMMUNITY)
Admission: RE | Admit: 2022-10-13 | Discharge: 2022-10-13 | Disposition: A | Discharge status: Home / Self Care | Payer: Medicare Other | Attending: Orthopedic Surgery | Admitting: Orthopedic Surgery

## 2022-10-13 DIAGNOSIS — G4730 Sleep apnea, unspecified: Secondary | ICD-10-CM | POA: Diagnosis not present

## 2022-10-13 DIAGNOSIS — K219 Gastro-esophageal reflux disease without esophagitis: Secondary | ICD-10-CM | POA: Diagnosis not present

## 2022-10-13 DIAGNOSIS — F419 Anxiety disorder, unspecified: Secondary | ICD-10-CM | POA: Diagnosis not present

## 2022-10-13 DIAGNOSIS — S52121D Displaced fracture of head of right radius, subsequent encounter for closed fracture with routine healing: Secondary | ICD-10-CM | POA: Diagnosis present

## 2022-10-13 DIAGNOSIS — G4733 Obstructive sleep apnea (adult) (pediatric): Secondary | ICD-10-CM | POA: Diagnosis not present

## 2022-10-13 DIAGNOSIS — E039 Hypothyroidism, unspecified: Secondary | ICD-10-CM | POA: Diagnosis not present

## 2022-10-13 DIAGNOSIS — X58XXXD Exposure to other specified factors, subsequent encounter: Secondary | ICD-10-CM | POA: Insufficient documentation

## 2022-10-13 DIAGNOSIS — T8489XA Other specified complication of internal orthopedic prosthetic devices, implants and grafts, initial encounter: Secondary | ICD-10-CM | POA: Diagnosis not present

## 2022-10-13 DIAGNOSIS — F32A Depression, unspecified: Secondary | ICD-10-CM | POA: Insufficient documentation

## 2022-10-13 DIAGNOSIS — I10 Essential (primary) hypertension: Secondary | ICD-10-CM | POA: Diagnosis not present

## 2022-10-13 HISTORY — DX: Hypothyroidism, unspecified: E03.9

## 2022-10-13 HISTORY — PX: EXTERNAL FIXATION REMOVAL: SHX5040

## 2022-10-13 HISTORY — DX: Gastro-esophageal reflux disease without esophagitis: K21.9

## 2022-10-13 LAB — BASIC METABOLIC PANEL
Anion gap: 15 (ref 5–15)
BUN: 16 mg/dL (ref 8–23)
CO2: 21 mmol/L — ABNORMAL LOW (ref 22–32)
Calcium: 9.9 mg/dL (ref 8.9–10.3)
Chloride: 106 mmol/L (ref 98–111)
Creatinine, Ser: 0.85 mg/dL (ref 0.44–1.00)
GFR, Estimated: >60 mL/min (ref >60)
Glucose, Bld: 137 mg/dL — ABNORMAL HIGH (ref 70–99)
Potassium: 3.7 mmol/L (ref 3.5–5.1)
Sodium: 142 mmol/L (ref 135–145)

## 2022-10-13 LAB — CBC
HCT: 40.9 % (ref 36.0–46.0)
Hemoglobin: 13.2 g/dL (ref 12.0–15.0)
MCH: 32.1 pg (ref 26.0–34.0)
MCHC: 32.3 g/dL (ref 30.0–36.0)
MCV: 99.5 fL (ref 80.0–100.0)
Platelets: 160 10*3/uL (ref 150–400)
RBC: 4.11 MIL/uL (ref 3.87–5.11)
RDW: 13.5 % (ref 11.5–15.5)
WBC: 4.3 10*3/uL (ref 4.0–10.5)
nRBC: 0 % (ref 0.0–0.2)

## 2022-10-13 SURGERY — REMOVAL, EXTERNAL FIXATION DEVICE, UPPER EXTREMITY
Anesthesia: General | Laterality: Right

## 2022-10-13 MED ORDER — FENTANYL CITRATE (PF) 250 MCG/5ML IJ SOLN
INTRAMUSCULAR | Status: DC | PRN
  Administered 2022-10-13: 50 ug via INTRAVENOUS
  Administered 2022-10-13: 25 ug via INTRAVENOUS

## 2022-10-13 MED ORDER — LACTATED RINGERS IV SOLN: INTRAVENOUS | Status: DC

## 2022-10-13 MED ORDER — ORAL CARE MOUTH RINSE: 15.0000 mL | Freq: Once | OROMUCOSAL | Status: AC

## 2022-10-13 MED ORDER — PROPOFOL 10 MG/ML IV BOLUS
INTRAVENOUS | Status: DC | PRN
  Administered 2022-10-13: 160 mg via INTRAVENOUS

## 2022-10-13 MED ORDER — DEXAMETHASONE SODIUM PHOSPHATE 10 MG/ML IJ SOLN
INTRAMUSCULAR | Status: DC | PRN
  Administered 2022-10-13: 4 mg via INTRAVENOUS

## 2022-10-13 MED ORDER — OXYCODONE HCL 5 MG PO TABS
5.0000 mg | ORAL_TABLET | Freq: Once | ORAL | Status: AC | PRN
  Administered 2022-10-13: 5 mg via ORAL

## 2022-10-13 MED ORDER — OXYCODONE HCL 5 MG PO TABS
ORAL_TABLET | ORAL | Status: AC
  Filled 2022-10-13: qty 1

## 2022-10-13 MED ORDER — LIDOCAINE 2% (20 MG/ML) 5 ML SYRINGE
INTRAMUSCULAR | Status: AC
  Filled 2022-10-13: qty 5

## 2022-10-13 MED ORDER — CHLORHEXIDINE GLUCONATE 0.12 % MT SOLN
15.0000 mL | Freq: Once | OROMUCOSAL | Status: AC
  Administered 2022-10-13: 15 mL via OROMUCOSAL
  Filled 2022-10-13: qty 15

## 2022-10-13 MED ORDER — ONDANSETRON HCL 4 MG/2ML IJ SOLN
INTRAMUSCULAR | Status: DC | PRN
  Administered 2022-10-13: 4 mg via INTRAVENOUS

## 2022-10-13 MED ORDER — FENTANYL CITRATE (PF) 100 MCG/2ML IJ SOLN: 25.0000 ug | INTRAMUSCULAR | Status: DC | PRN

## 2022-10-13 MED ORDER — SUCCINYLCHOLINE CHLORIDE 200 MG/10ML IV SOSY
PREFILLED_SYRINGE | INTRAVENOUS | Status: AC
  Filled 2022-10-13: qty 10

## 2022-10-13 MED ORDER — CEFAZOLIN SODIUM-DEXTROSE 2-4 GM/100ML-% IV SOLN
2.0000 g | INTRAVENOUS | Status: AC
  Administered 2022-10-13: 2 g via INTRAVENOUS
  Filled 2022-10-13: qty 100

## 2022-10-13 MED ORDER — FENTANYL CITRATE (PF) 250 MCG/5ML IJ SOLN
INTRAMUSCULAR | Status: AC
  Filled 2022-10-13: qty 5

## 2022-10-13 MED ORDER — PHENYLEPHRINE 80 MCG/ML (10ML) SYRINGE FOR IV PUSH (FOR BLOOD PRESSURE SUPPORT)
PREFILLED_SYRINGE | INTRAVENOUS | Status: DC | PRN
  Administered 2022-10-13: 160 ug via INTRAVENOUS
  Administered 2022-10-13 (×2): 80 ug via INTRAVENOUS

## 2022-10-13 MED ORDER — OXYCODONE HCL 5 MG/5ML PO SOLN: 5.0000 mg | Freq: Once | ORAL | Status: AC | PRN

## 2022-10-13 MED ORDER — ONDANSETRON HCL 4 MG/2ML IJ SOLN: 4.0000 mg | Freq: Four times a day (QID) | INTRAMUSCULAR | Status: DC | PRN

## 2022-10-13 MED ORDER — MIDAZOLAM HCL 2 MG/2ML IJ SOLN
INTRAMUSCULAR | Status: AC
  Filled 2022-10-13: qty 2

## 2022-10-13 MED ORDER — ROCURONIUM BROMIDE 10 MG/ML (PF) SYRINGE
PREFILLED_SYRINGE | INTRAVENOUS | Status: AC
  Filled 2022-10-13: qty 10

## 2022-10-13 MED ORDER — PHENYLEPHRINE 80 MCG/ML (10ML) SYRINGE FOR IV PUSH (FOR BLOOD PRESSURE SUPPORT)
PREFILLED_SYRINGE | INTRAVENOUS | Status: AC
  Filled 2022-10-13: qty 10

## 2022-10-13 MED ORDER — LIDOCAINE 2% (20 MG/ML) 5 ML SYRINGE
INTRAMUSCULAR | Status: DC | PRN
  Administered 2022-10-13: 60 mg via INTRAVENOUS

## 2022-10-13 MED ORDER — PROPOFOL 10 MG/ML IV BOLUS
INTRAVENOUS | Status: AC
  Filled 2022-10-13: qty 20

## 2022-10-13 MED ORDER — MIDAZOLAM HCL 2 MG/2ML IJ SOLN
INTRAMUSCULAR | Status: DC | PRN
  Administered 2022-10-13: 2 mg via INTRAVENOUS

## 2022-10-13 MED ORDER — 0.9 % SODIUM CHLORIDE (POUR BTL) OPTIME
TOPICAL | Status: DC | PRN
  Administered 2022-10-13: 1000 mL

## 2022-10-13 MED ORDER — ONDANSETRON HCL 4 MG/2ML IJ SOLN
INTRAMUSCULAR | Status: AC
  Filled 2022-10-13: qty 2

## 2022-10-13 MED ORDER — DEXAMETHASONE SODIUM PHOSPHATE 10 MG/ML IJ SOLN
INTRAMUSCULAR | Status: AC
  Filled 2022-10-13: qty 1

## 2022-10-13 SURGICAL SUPPLY — 48 items
BAG COUNTER SPONGE SURGICOUNT (BAG) ×1 IMPLANT
BNDG COHESIVE 6X5 TAN STRL LF (GAUZE/BANDAGES/DRESSINGS) ×1 IMPLANT
BNDG ELASTIC 4X5.8 VLCR STR LF (GAUZE/BANDAGES/DRESSINGS) ×1 IMPLANT
BNDG ELASTIC 6X5.8 VLCR STR LF (GAUZE/BANDAGES/DRESSINGS) ×1 IMPLANT
BNDG GAUZE DERMACEA FLUFF 4 (GAUZE/BANDAGES/DRESSINGS) ×2 IMPLANT
BRUSH SCRUB EZ PLAIN DRY (MISCELLANEOUS) ×2 IMPLANT
COVER SURGICAL LIGHT HANDLE (MISCELLANEOUS) ×2 IMPLANT
DRAPE C-ARM 42X72 X-RAY (DRAPES) IMPLANT
DRAPE C-ARMOR (DRAPES) ×1 IMPLANT
DRAPE U-SHAPE 47X51 STRL (DRAPES) ×1 IMPLANT
DRSG ADAPTIC 3X8 NADH LF (GAUZE/BANDAGES/DRESSINGS) ×1 IMPLANT
ELECT REM PT RETURN 9FT ADLT (ELECTROSURGICAL) ×1
ELECTRODE REM PT RTRN 9FT ADLT (ELECTROSURGICAL) ×1 IMPLANT
GAUZE SPONGE 4X4 12PLY STRL (GAUZE/BANDAGES/DRESSINGS) ×1 IMPLANT
GLOVE BIO SURGEON STRL SZ7.5 (GLOVE) ×1 IMPLANT
GLOVE BIO SURGEON STRL SZ8 (GLOVE) ×1 IMPLANT
GLOVE BIOGEL PI IND STRL 7.5 (GLOVE) ×1 IMPLANT
GLOVE BIOGEL PI IND STRL 8 (GLOVE) ×1 IMPLANT
GLOVE SURG ORTHO LTX SZ7.5 (GLOVE) ×2 IMPLANT
GOWN STRL REUS W/ TWL LRG LVL3 (GOWN DISPOSABLE) ×2 IMPLANT
GOWN STRL REUS W/ TWL XL LVL3 (GOWN DISPOSABLE) ×1 IMPLANT
GOWN STRL REUS W/TWL LRG LVL3 (GOWN DISPOSABLE) ×2
GOWN STRL REUS W/TWL XL LVL3 (GOWN DISPOSABLE) ×1
KIT BASIN OR (CUSTOM PROCEDURE TRAY) ×1 IMPLANT
KIT TURNOVER KIT B (KITS) ×1 IMPLANT
NS IRRIG 1000ML POUR BTL (IV SOLUTION) ×1 IMPLANT
PACK ORTHO EXTREMITY (CUSTOM PROCEDURE TRAY) ×1 IMPLANT
PAD ARMBOARD 7.5X6 YLW CONV (MISCELLANEOUS) ×2 IMPLANT
PADDING CAST COTTON 6X4 STRL (CAST SUPPLIES) ×3 IMPLANT
SPONGE T-LAP 18X18 ~~LOC~~+RFID (SPONGE) ×1 IMPLANT
STAPLER VISISTAT 35W (STAPLE) IMPLANT
STOCKINETTE IMPERVIOUS LG (DRAPES) ×1 IMPLANT
STRIP CLOSURE SKIN 1/2X4 (GAUZE/BANDAGES/DRESSINGS) IMPLANT
SUCTION FRAZIER HANDLE 10FR (MISCELLANEOUS)
SUCTION TUBE FRAZIER 10FR DISP (MISCELLANEOUS) IMPLANT
SUT ETHILON 2 0 FS 18 (SUTURE) IMPLANT
SUT PDS AB 2-0 CT1 27 (SUTURE) IMPLANT
SUT VIC AB 0 CT1 27 (SUTURE)
SUT VIC AB 0 CT1 27XBRD ANBCTR (SUTURE) IMPLANT
SUT VIC AB 2-0 CT1 27 (SUTURE)
SUT VIC AB 2-0 CT1 TAPERPNT 27 (SUTURE) IMPLANT
SYR CONTROL 10ML LL (SYRINGE) IMPLANT
TOWEL GREEN STERILE (TOWEL DISPOSABLE) ×2 IMPLANT
TOWEL GREEN STERILE FF (TOWEL DISPOSABLE) ×2 IMPLANT
TUBE CONNECTING 12X1/4 (SUCTIONS) ×1 IMPLANT
UNDERPAD 30X36 HEAVY ABSORB (UNDERPADS AND DIAPERS) ×1 IMPLANT
WATER STERILE IRR 1000ML POUR (IV SOLUTION) ×2 IMPLANT
YANKAUER SUCT BULB TIP NO VENT (SUCTIONS) ×1 IMPLANT

## 2022-10-13 NOTE — Anesthesia Procedure Notes (Signed)
Procedure Name: LMA Insertion Date/Time: 10/13/2022 8:12 AM  Performed by: Sammie Bench, CRNAPre-anesthesia Checklist: Patient identified, Emergency Drugs available, Suction available and Patient being monitored Patient Re-evaluated:Patient Re-evaluated prior to induction Oxygen Delivery Method: Circle System Utilized Preoxygenation: Pre-oxygenation with 100% oxygen Induction Type: IV induction Ventilation: Mask ventilation without difficulty LMA: LMA inserted LMA Size: 4.0 Number of attempts: 1 Airway Equipment and Method: Bite block Placement Confirmation: positive ETCO2 Tube secured with: Tape Dental Injury: Teeth and Oropharynx as per pre-operative assessment

## 2022-10-13 NOTE — Anesthesia Postprocedure Evaluation (Signed)
Anesthesia Post Note  Patient: Vanessa [name]  Procedure(s) Performed: REMOVAL EXTERNAL FIXATION ARM (Right)     Patient location during evaluation: PACU Anesthesia Type: General Level of consciousness: awake and alert Pain management: pain level controlled Vital Signs Assessment: post-procedure vital signs reviewed and stable Respiratory status: spontaneous breathing, nonlabored ventilation, respiratory function stable and patient connected to nasal cannula oxygen Cardiovascular status: blood pressure returned to baseline and stable Postop Assessment: no apparent nausea or vomiting Anesthetic complications: no   No notable events documented.  Last Vitals:  Vitals:   10/13/22 0915 10/13/22 0930  BP: (!) 139/94 (!) 131/99  Pulse: 83 91  Resp: 12 15  Temp:  (!) 36.1 C  SpO2: 98% 97%    Last Pain:  Vitals:   10/13/22 0930  TempSrc:   PainSc: [place]

## 2022-10-13 NOTE — Op Note (Signed)
NAME: Vanessa [name], Vanessa [name] MEDICAL RECORD NO: [id number] ACCOUNT NO: [id number] DATE OF BIRTH: 04-24-1951 FACILITY: [facility] LOCATION: [facility] PHYSICIAN: Astrid Divine. Marcelino Scot, MD  Operative Report   DATE OF PROCEDURE: 10/13/2022  PREOPERATIVE DIAGNOSIS: 1.  Retained external fixator, right elbow. 2. Ulcerated pin sites, right humerus and distal ulna.  POSTOPERATIVE DIAGNOSIS: 1.  Retained external fixator, right elbow. 2. Ulcerated pin sites, right humerus and distal ulna.  PROCEDURES:   1.  Removal of right elbow external fixator under anesthesia. 2.  Debridement with curettage of pin sites including bone, right humerus and right ulna. 3.  Manipulation of right elbow, increasing range of motion to 65-135. 4.  Application of a manual stress under fluoroscopy.  SURGEON:  Astrid Divine. Marcelino Scot, MD  ASSISTANT:  None.  ANESTHESIA:  General.  COMPLICATIONS:  None.  The patient disposition to PACU.  CONDITION: Stable.  BRIEF SUMMARY, INDICATION FOR PROCEDURE:  This is a very pleasant 71 year old right hand dominant female well known to the orthopedic trauma service for prior bilateral upper extremity injuries.  This included a fracture, dislocation of the right elbow,  which was treated with a surgical repair and external fixation for profound instability.  The patient has been immobilized in the external fixator since surgery, but has regained a digital and wrist motion and undergone right shoulder surgery by Dr.  Victorino December as well.  I discussed with her the risks and benefits of removal and manipulation today including the possibility of instability with loss of reduction of the elbow joint fracture, nerve injury, vessel injury, heterotopic ossification,  failure to prevent infection from the ulcerated pin sites and others.  She acknowledged these risks and did provide consent to proceed.  BRIEF SUMMARY OF THE PROCEDURE:  The patient was given preoperative antibiotics, taken to the  operating room where general anesthesia was induced.  Right upper extremity was then isolated with towels.  The fixator was removed and the C-arm was brought  in, confirming anatomic reduction of the elbow joint.  A very gentle manipulation was performed in flexion increasing the range from 95 degrees to 130 degrees with excellent stability as I applied this stress I did watch it under live fluoroscopy.  I did  not identify any subluxation at all and was expected in flexion.  Then, very gently I increased the range of motion from 90 degrees to 65 degrees.  I did not go past that.  I did have the forearm in pronation during that in order to provide a little  extra stability.  There was no subluxation of the ulnohumeral joint during that maneuver.  I did not appreciate any significant heterotopic ossification.  Final images with a concentric reduction were obtained and then a bulky dressing applied and a  sling.  Prior to applying the dressing however, all the pins were removed and then serial curettage performed of each of the pin sites, the two at the humerus were ulcerated as noted and a larger diameter curette was used first to get the superficial  tissues at the skin layer, subcutaneous and muscle layer and these irrigated and then a small curette used to debride in the bone in both holes and then irrigated again.  In similar fashion, the more proximal of the two ulnar pin sites was not  significantly ulcerated, but was debrided with curettage as well and then the more distal hole was treated in similar manner the humerus with a serial curettage again at the skin, subcutaneous and muscle layer and  then deep into the bone with a smaller  curette.  Copious irrigation and nonstick dressing applied.  As noted above the patient was taken to the PACU in stable condition.  PROGNOSIS: The patient will be allowed gentle unrestricted range of motion to tolerance.  I will see her back in the office in 10 days or so  for reevaluation and new x-rays.  She is at risk for loss of concentric reduction, heterotopic ossification and  failure to regain full motion. If capsular release were required I anticipate that to be at 6-12 months following her initial index procedure.   PUS D: 10/13/2022 11:12:14 am T: 10/13/2022 1:30:00 pm  JOB: [id number]/ [id number]

## 2022-10-13 NOTE — Transfer of Care (Signed)
Immediate Anesthesia Transfer of Care Note  Patient: Vanessa [name]  Procedure(s) Performed: REMOVAL EXTERNAL FIXATION ARM (Right)  Patient Location: PACU  Anesthesia Type:General  Level of Consciousness: awake, alert , oriented, and patient cooperative  Airway & Oxygen Therapy: Patient Spontanous Breathing  Post-op Assessment: Report given to RN and Post -op Vital signs reviewed and stable  Post vital signs: Reviewed and stable  Last Vitals:  Vitals Value Taken Time  BP 131/99 10/13/22 0930  Temp 36.1 C 10/13/22 0930  Pulse 80 10/13/22 0936  Resp 14 10/13/22 0936  SpO2 95 % 10/13/22 0936  Vitals shown include unvalidated device data.  Last Pain:  Vitals:   10/13/22 0930  TempSrc:   PainSc: 4       Patients Stated Pain Goal: 3 (32/02/33 4356)  Complications: No notable events documented.

## 2022-10-13 NOTE — Anesthesia Preprocedure Evaluation (Signed)
Anesthesia Evaluation  Patient identified by MRN, date of birth, ID band Patient awake    Reviewed: Allergy & Precautions, H&P , NPO status , Patient's Chart, lab work & pertinent test results  Airway Mallampati: II   Neck ROM: full    Dental   Pulmonary sleep apnea ,    breath sounds clear to auscultation       Cardiovascular hypertension,  Rhythm:regular Rate:Normal     Neuro/Psych PSYCHIATRIC DISORDERS Anxiety Depression    GI/Hepatic GERD  ,  Endo/Other  Hypothyroidism   Renal/GU      Musculoskeletal  (+) Arthritis ,   Abdominal   Peds  Hematology   Anesthesia Other Findings   Reproductive/Obstetrics                             Anesthesia Physical Anesthesia Plan  ASA: 3  Anesthesia Plan: General   Post-op Pain Management:    Induction: Intravenous  PONV Risk Score and Plan: 3 and Ondansetron, Dexamethasone, Midazolam and Treatment may vary due to age or medical condition  Airway Management Planned: LMA  Additional Equipment:   Intra-op Plan:   Post-operative Plan: Extubation in OR  Informed Consent: I have reviewed the patients History and Physical, chart, labs and discussed the procedure including the risks, benefits and alternatives for the proposed anesthesia with the patient or authorized representative who has indicated his/her understanding and acceptance.     Dental advisory given  Plan Discussed with: CRNA, Anesthesiologist and Surgeon  Anesthesia Plan Comments:         Anesthesia Quick Evaluation

## 2022-10-13 NOTE — Op Note (Signed)
#  [id number] 

## 2022-10-14 ENCOUNTER — Encounter (HOSPITAL_COMMUNITY): Payer: Self-pay | Admitting: Orthopedic Surgery

## 2022-10-14 ENCOUNTER — Inpatient Hospital Stay: Payer: BLUE CROSS/BLUE SHIELD | Admitting: Physical Medicine & Rehabilitation

## 2022-10-21 ENCOUNTER — Encounter: Payer: Self-pay | Admitting: Physical Medicine & Rehabilitation

## 2022-10-21 ENCOUNTER — Encounter: Payer: Medicare Other | Attending: Physical Medicine & Rehabilitation | Admitting: Physical Medicine & Rehabilitation

## 2022-10-21 VITALS — BP 114/82 | HR 79 | Ht 66.0 in | Wt 195.0 lb

## 2022-10-21 DIAGNOSIS — S42351S Displaced comminuted fracture of shaft of humerus, right arm, sequela: Secondary | ICD-10-CM | POA: Diagnosis not present

## 2022-10-21 DIAGNOSIS — S069X1S Unspecified intracranial injury with loss of consciousness of 30 minutes or less, sequela: Secondary | ICD-10-CM | POA: Insufficient documentation

## 2022-10-21 DIAGNOSIS — G4709 Other insomnia: Secondary | ICD-10-CM | POA: Diagnosis not present

## 2022-10-21 MED ORDER — TRAZODONE HCL 50 MG PO TABS: 50.0000 mg | ORAL_TABLET | Freq: Every day | ORAL | 4 refills | Status: DC

## 2022-10-21 NOTE — Progress Notes (Signed)
Subjective:    Patient ID: Vanessa [name], female    DOB: 1951/05/13, 71 y.o.   MRN: [id number]  HPI Vanessa [name] is here in follow up of her TBI and polytrauma. She has had her reverse right TSA and take down of her ex-fix about 6 weeks ago. She is doing very well with her pain control. She is about to start outpt therapies.   From a standpoint of her brain injury, she's demonstrating improvement in her memory. She still has some struggles with distracted memory and concentration however.   She is sleeping with her meds but wants to come off the sonata which she thinks lead to her sleep walking down the stairs after she took 2 pills accidentally.  Currently she reads to help herself drift off to sleep.   Mood has been positive. Anxiety has been minimal. She denies depression. She is on cymbalta  Bowels and bladder are working well.   Pain is most prominent in her right shoulder and elbow. She uses oxycodone 10mg   sometimes at bedtime. She hasn't used her hydrocodone.  Pain Inventory Average Pain 5 Pain Right Now 0 My pain is intermittent and aching  In the last 24 hours, has pain interfered with the following? General activity 2 Relation with others 0 Enjoyment of life 2  What TIME of day is your pain at its worst? evening Sleep (in general) Good  Pain is worse with: some activites Pain improves with: rest, pacing activities, and medication Relief from Meds: 10  walk without assistance how many minutes can you walk? 15-20 ability to climb steps?  yes do you drive?  yes Do you have any goals in this area?  yes  retired  anxiety  Any changes since last visit?  yes x-rays Dr.  Any changes since last visit?  no    Family History  Problem Relation Age of Onset   Cancer Mother    Social History   Socioeconomic History   Marital status: Married    Spouse name: Not on file   Number of children: Not on file   Years of education: Not on file   Highest  education level: Not on file  Occupational History   Not on file  Tobacco Use   Smoking status: Never   Smokeless tobacco: Never  Vaping Use   Vaping Use: Never used  Substance and Sexual Activity   Alcohol use: No    Alcohol/week: 0.0 standard drinks of alcohol   Drug use: No   Sexual activity: Yes    Birth control/protection: None  Other Topics Concern   Not on file  Social History Narrative   Not on file   Social Determinants of Health   Financial Resource Strain: Not on file  Food Insecurity: No Food Insecurity (09/10/2022)   Hunger Vital Sign    Worried About Running Out of Food in the Last Year: Never true    Ran Out of Food in the Last Year: Never true  Transportation Needs: No Transportation Needs (09/10/2022)   PRAPARE - 09/12/2022 (Medical): No    Lack of Transportation (Non-Medical): No  Physical Activity: Not on file  Stress: Not on file  Social Connections: Not on file   Past Surgical History:  Procedure Laterality Date   ELBOW LIGAMENT RECONSTRUCTION Right 08/27/2022   Procedure: ELBOW LIGAMENT RECONSTRUCTION;  Surgeon: 08/29/2022, MD;  Location: [facility] OR;  Service: Orthopedics;  Laterality: Right;   EXTERNAL  FIXATION REMOVAL Right 10/13/2022   Procedure: REMOVAL EXTERNAL FIXATION ARM;  Surgeon: Myrene Galas, MD;  Location: [facility] OR;  Service: Orthopedics;  Laterality: Right;   FRACTURE SURGERY  2011   left ankle Fx s/p fixation   KNEE CARTILAGE SURGERY Right    OPEN REDUCTION INTERNAL FIXATION (ORIF) DISTAL RADIAL FRACTURE Left 08/27/2022   Procedure: OPEN REDUCTION INTERNAL FIXATION (ORIF) DISTAL RADIUS FRACTURE;  Surgeon: Myrene Galas, MD;  Location: [facility] OR;  Service: Orthopedics;  Laterality: Left;   ORIF ULNAR FRACTURE Left 08/27/2022   Procedure: OPEN REDUCTION INTERNAL FIXATION (ORIF) ULNAR FRACTURE;  Surgeon: Myrene Galas, MD;  Location: [facility] OR;  Service: Orthopedics;  Laterality: Left;   RADIAL HEAD ARTHROPLASTY Right  08/27/2022   Procedure: RADIAL HEAD ARTHROPLASTY;  Surgeon: Myrene Galas, MD;  Location[facility] OR;  Service: Orthopedics;  Laterality: Right;   REVERSE SHOULDER ARTHROPLASTY Right 09/10/2022   Procedure: REVERSE SHOULDER ARTHROPLASTY;  Surgeon: Yolonda Kida, MD;  Location: [facility] OR[facility];  Service: Orthopedics;  Laterality: Right;   TONSILLECTOMY     Past Medical History:  Diagnosis Date   Anxiety    Arthritis    Bladder prolapse, female, acquired    COVID-19 03/2022   Depression    GERD (gastroesophageal reflux disease)    Hypertension    Hypothyroidism    Myalgia    Occasional tremors    OSA (obstructive sleep apnea)    mild   Restless leg syndrome    Thrombocytopenia (HCC) in the past    BP 114/82   Pulse 79   Ht 5\' 6"  (1.676 m)   Wt 195 lb (88.5 kg)   SpO2 97%   BMI 31.47 kg/m   Opioid Risk Score:   Fall Risk Score:  `1  Depression screen [facility] 2/9     10/21/2022    2:09 PM 02/24/2016    8:23 AM 01/13/2016    8:14 AM 08/09/2015    8:17 AM  Depression screen PHQ 2/9  Decreased Interest 1 0 0 0  Down, Depressed, Hopeless 0 0 0 0  PHQ - 2 Score 1 0 0 0  Altered sleeping 1     Tired, decreased energy 1     Change in appetite 1     Feeling bad or failure about yourself  0     Trouble concentrating 0     Moving slowly or fidgety/restless 0     Suicidal thoughts 0     PHQ-9 Score 4      Review of Systems  Musculoskeletal:        Pain in the right shoulder & right elbow area  Psychiatric/Behavioral:         Anxiety  All other systems reviewed and are negative.      Objective:   Physical Exam  Gen: no distress, normal appearing HEENT: oral mucosa pink and moist, NCAT Cardio: Reg rate Chest: normal effort, normal rate of breathing Abd: soft, non-distended Ext: no edema Psych: pleasant, normal affect Skin: residual wounds from ex fix pin sites Neuro: alert to person, place, missed number of day. Struggled with numerical sequencing more than verbal.  Spelled world forward but needed help going backwards. Serial 7's 1/3. Abstract thinking intact. Intact recall of current events. Recalled 2/3 objects after 5 minutes. CN exam intact. No focal motor weakness or sensory changes althought right arm a little limited.  Musculoskeletal: right shoulder and arm limited due to pain.         Assessment:  Medical  Problem List and Plan: 1. 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             -outpt therapies with ortho for RUE  -consider outpt SLP once she completes shoulder rehab depending upon where she's at from a memory standpoint. Discussed some exercises they could work on in the meantime to maximize memory and focus.  2.    Pain Management: gabapentin for RLS 3. Mood/Behavior/Sleep: .              -sonata per neurology  --dc -trial of trazodone 50mg  qhs -continue melatonin 10mg  qhs            4. Right elbow Fx/dislocation: Per ortho:  9. Left distal radius/ulna Fx s/p ORIF 9/14:    -per ortho 10. Right proximal humerus Fx:  s/p right TSA    Thirty minutes of face to face patient care time were spent during this visit. All questions were encouraged and answered. Follow up with me in 2 mos.

## 2022-10-21 NOTE — Patient Instructions (Addendum)
ALWAYS FEEL FREE TO CALL OUR OFFICE WITH ANY PROBLEMS OR QUESTIONS [phone])  **PLEASE NOTE** ALL MEDICATION REFILL REQUESTS (INCLUDING CONTROLLED SUBSTANCES) NEED TO BE MADE AT LEAST 7 DAYS PRIOR TO REFILL BEING DUE. ANY REFILL REQUESTS INSIDE THAT TIME FRAME MAY RESULT IN DELAYS IN RECEIVING YOUR PRESCRIPTION.    Stop sonata!!

## 2022-11-08 NOTE — Op Note (Signed)
NAME: Vanessa [name], Vanessa [name] MEDICAL RECORD NO: [id number] ACCOUNT NO: [id number] DATE OF BIRTH: 11/25/1951 FACILITY: [facility] LOCATION: [id number] PHYSICIAN: Astrid Divine. Marcelino Scot, MD  Operative Report   DATE OF PROCEDURE: 08/27/2022  PREOPERATIVE DIAGNOSES: 1.  Right lateral epicondyle fracture of the distal humerus. 2.  Right ulnohumeral dislocation. 3.  Right radial head fracture. 4.  Right ulna coronoid process fracture. 5.  Left distal radius fracture, comminuted. 6.  Left distal ulnar shaft fracture, comminuted.  POSTOPERATIVE DIAGNOSES: 1.  Right lateral epicondyle fracture of the distal humerus. 2.  Right ulnohumeral dislocation. 3.  Right radial head fracture. 4.  Right ulna coronoid process fracture. 5.  Left distal radius fracture, comminuted. 6.  Left distal ulnar shaft fracture, comminuted.  PROCEDURES PERFORMED: 1.  Open treatment of right ulnohumeral dislocation with repair of the lateral ulnar collateral ligament. 2.  Open treatment of the right lateral epicondyle humerus fracture. 3.  ORIF of right ulna coronoid process fracture. 4.  Radial head arthroplasty using a #9 Acumed long stem and 20 mm head. 5.  ORIF of left comminuted distal radius fracture with multiple articular fragments. 6.  ORIF of left distal ulna shaft fracture. 7.  Application of uniplanar external fixator, right elbow.  SURGEON:  [place], MD  ASSISTANT:  Ainsley Spinner, PA-C.  ANESTHESIA:  General.  COMPLICATIONS:  None.  DISPOSITION:  To PACU.   CONDITION:  Stable.  I/O:  Please refer to the anesthetic record for detailed account.  BRIEF SUMMARY OF INDICATIONS FOR PROCEDURE:  Vanessa [name] is a very pleasant right hand dominant female who fell down 12 stairs with bilateral upper extremity injuries in addition to subarachnoid hemorrhage, loss of consciousness and other injuries.   She underwent closed reduction of both the elbow and the radius two days ago with splinting and now after  stabilization, presents for definitive operative repair.  I have discussed with her the risk of recurrent instability of the right elbow,  arthritis, nerve injury, vessel injury, DVT, PE and multiple other risks and she has provided consent to proceed.  BRIEF SUMMARY OF THE PROCEDURE:  The patient was taken to the operating room where general anesthesia was induced.  Both upper extremities were prepped and draped at the same time.  We began with the right side, making the posterior universal approach.   As dissection was carried down through the lateral epicondyle and supracondylar humerus, it was multi-fragmented, comminuted with no possibility of anatomic reconstruction.  Furthermore, there was no cortical bone into which to place a suture anchor  reliably, in order to reconstruct the lateral ulnar collateral ligament.  As I continued, I was able to identify the radial head, carefully pronate the forearm during this approach and make a transverse cut. The radial head was then prepared first with  reaming of the shaft and then seating of a #9 trial which restored appropriate length.  The head itself was undersized rather to 20 mm.  This rotation was carefully watched and the prosthesis appeared appropriate with the trials in a gentle flexion and  extension arc.  There remained a gross instability.  Through the lateral approach, I was able to remove all intra-articular fragments.  The coronoid was also comminuted, but I passed #2 FiberWire into the fragments and capsule to incorporate those and  pull those down anatomically into the bed from which they were avulsed.  Once the coronoid fragments were repaired in this manner with solid integrity and stability, the actual implants were placed in the  radial head.  The radial head reduced.  This was  taken through range of marked range of motion including pronation, supination and again with what appeared to be appropriate fit and stability.  It was very  difficult to gauge the ulnohumeral joint in extension because of the impaction of the ulna at the  trochlea such that widening could not be reliably used.  I then attempted to reconstruct the lateral ulnar collateral ligament and not only was it missing from the lateral epicondyle, but it was also avulsed from its insertion onto the ulna.  I was able  to find the stump of tissue there and used a suture anchor to tie it into the ulna and restored that side of it, however, proximally, I placed a suture anchor into the cancellous bone of the lateral epicondyle and this was black integrity and the suture  anchor pulled out.  At that point, I began to collect the fragments of the lateral epicondyle itself and drilled holes through the olecranon fossa and sutured this with FiberWire in a cerclage type fashion bringing the fragments of the lateral  epicondyle together.  I then sewed the proximal stump of the lateral ulnar collateral ligament into this reconstructed lateral epicondyle.  The joint had improved stability, but not sufficient to allow for flexion and extension arc.  Consequently, at  that point, I also had to place an external fixator using the Biomet system placing 2 pins posteriorly and low in the radius  because of the anticipated future reverse shoulder arthroplasty for her proximal humerus fracture and then 2 pins in the ulna.   It was held in this static reduced position and the clamps tightened securely.  Wound was irrigated thoroughly and then closed in standard layered fashion using Vicryl and nylon.  Sterile gently compressive dressing was applied.  Montez Morita, PA-C was  present and assisted me throughout this extremely difficult technically demanding portion of the procedure.  We then turned our attention to the left side.  A standard volar approach to the distal radius was undertaken.  The towel bump was placed under the dorsum of the wrist to restore appropriate tilt.  My assistant helped  to pull and restore traction.  A tenaculum was placed across the articular surface  after first establishing provisionally with K-wires, the appropriate position of the plate.  This squeezed all of the different segments of the articular surface together which they were, together in appropriate alignment.  Multiple pegs were placed to  support the three different segments and maintain alignment.  Additional screws were placed in the shaft.  I then assessed the wrist and found it to be significantly unstable owing to a distal comminuted ulna shaft fracture.  Consequently, a separate  fixation of this was warranted.  A direct ulnar approach was made through the subcutaneous border and small, a mini-frag plate from the Biomet set used to secure standard and then locked fixation such that I had 3 bicortical screws distally and three  proximally.  This restored stability.  Montez Morita, PA-C was present and assisted me with this portion of the procedure as well.  All wounds were irrigated thoroughly and then closed in standard layered fashion after irrigation.  A volar splint was applied there. Patient was awakened from  anesthesia and transported to PACU in stable condition.  PROGNOSIS:  The patient is at elevated risk for recurrent instability and as well as stiffness to the right elbow, given the magnitude of her injuries.  She is at increased risk for infection because of the pins and anticipated shoulder arthroplasty.   This has been discussed with the patient and her husband.   MUK D: 11/08/2022 10:59:48 pm T: 11/08/2022 11:28:00 pm  JOB: [id number]

## 2023-01-20 ENCOUNTER — Encounter: Payer: Self-pay | Admitting: Physical Medicine & Rehabilitation

## 2023-01-20 ENCOUNTER — Encounter: Payer: Medicare Other | Attending: Physical Medicine & Rehabilitation | Admitting: Physical Medicine & Rehabilitation

## 2023-01-20 DIAGNOSIS — G4709 Other insomnia: Secondary | ICD-10-CM | POA: Insufficient documentation

## 2023-01-20 MED ORDER — TRAZODONE HCL 100 MG PO TABS: 100.0000 mg | ORAL_TABLET | Freq: Every day | ORAL | 3 refills | Status: DC

## 2023-01-20 NOTE — Progress Notes (Signed)
Subjective:    Patient ID: Vanessa [name], female    DOB: 1950/12/25, 72 y.o.   MRN: [id number]  HPI  Vanessa [name] is here in follow up of her TBI and polytrauma. She is still working with therapy on her right shoulder. She still lacks abduction and rotation. She is using weights.  Right elbow is coming along.  She still lacks some extension in the elbow.  She is really having minimal pain.  She may use an occasional Tylenol but really is not using much of anything these days.  She does have meloxicam but uses rarely also.  From a memory standpoint, she has noticed a big improvement. She occasional she will have a memory lapse but they're few and far between. She does keep a daily routine which helps.  Other than that she feels that she is at her baseline  Her sleep is fair. We started trazodone at her last visit. It has helped but not consistently.  She is seeing a specialist previously for sleep with not a lot of benefit.  Apparently a sleep study was done but was inconclusive as she was awake for a lot of the test.  She continues on melatonin 10 mg at bedtime.  She has used [employer] in the past as well.     Pain Inventory Average Pain 2 Pain Right Now 2 My pain is dull  In the last 24 hours, has pain interfered with the following? General activity 0 Relation with others 0 Enjoyment of life 0 What TIME of day is your pain at its worst? night Sleep (in general) Fair  Pain is worse with: unsure Pain improves with: heat/ice Relief from Meds:  .  Family History  Problem Relation Age of Onset   Cancer Mother    Social History   Socioeconomic History   Marital status: Married    Spouse name: Not on file   Number of children: Not on file   Years of education: Not on file   Highest education level: Not on file  Occupational History   Not on file  Tobacco Use   Smoking status: Never   Smokeless tobacco: Never  Vaping Use   Vaping Use: Never used  Substance and Sexual  Activity   Alcohol use: No    Alcohol/week: 0.0 standard drinks of alcohol   Drug use: No   Sexual activity: Yes    Birth control/protection: None  Other Topics Concern   Not on file  Social History Narrative   Not on file   Social Determinants of Health   Financial Resource Strain: Not on file  Food Insecurity: No Food Insecurity (09/10/2022)   Hunger Vital Sign    Worried About Running Out of Food in the Last Year: Never true    Ran Out of Food in the Last Year: Never true  Transportation Needs: No Transportation Needs (09/10/2022)   PRAPARE - Hydrologist (Medical): No    Lack of Transportation (Non-Medical): No  Physical Activity: Not on file  Stress: Not on file  Social Connections: Not on file   Past Surgical History:  Procedure Laterality Date   ELBOW LIGAMENT RECONSTRUCTION Right 08/27/2022   Procedure: ELBOW LIGAMENT RECONSTRUCTION;  Surgeon: Altamese [place], MD;  Location: [place];  Service: Orthopedics;  Laterality: Right;   EXTERNAL FIXATION REMOVAL Right 10/13/2022   Procedure: REMOVAL EXTERNAL FIXATION ARM;  Surgeon: Altamese [place], MD;  Location: [place];  Service: Orthopedics;  Laterality: Right;  FRACTURE SURGERY  2011   left ankle Fx s/p fixation   KNEE CARTILAGE SURGERY Right    OPEN REDUCTION INTERNAL FIXATION (ORIF) DISTAL RADIAL FRACTURE Left 08/27/2022   Procedure: OPEN REDUCTION INTERNAL FIXATION (ORIF) DISTAL RADIUS FRACTURE;  Surgeon: Altamese [place], MD;  Location: [place];  Service: Orthopedics;  Laterality: Left;   ORIF ULNAR FRACTURE Left 08/27/2022   Procedure: OPEN REDUCTION INTERNAL FIXATION (ORIF) ULNAR FRACTURE;  Surgeon: Altamese [place], MD;  Location: [place];  Service: Orthopedics;  Laterality: Left;   RADIAL HEAD ARTHROPLASTY Right 08/27/2022   Procedure: RADIAL HEAD ARTHROPLASTY;  Surgeon: Altamese [place], MD;  Location: [place];  Service: Orthopedics;  Laterality: Right;   REVERSE SHOULDER ARTHROPLASTY Right 09/10/2022    Procedure: REVERSE SHOULDER ARTHROPLASTY;  Surgeon: Nicholes Stairs, MD;  Location: [facility];  Service: Orthopedics;  Laterality: Right;   TONSILLECTOMY     Past Surgical History:  Procedure Laterality Date   ELBOW LIGAMENT RECONSTRUCTION Right 08/27/2022   Procedure: ELBOW LIGAMENT RECONSTRUCTION;  Surgeon: Altamese [place], MD;  Location: [place];  Service: Orthopedics;  Laterality: Right;   EXTERNAL FIXATION REMOVAL Right 10/13/2022   Procedure: REMOVAL EXTERNAL FIXATION ARM;  Surgeon: Altamese [place], MD;  Location: [place];  Service: Orthopedics;  Laterality: Right;   FRACTURE SURGERY  2011   left ankle Fx s/p fixation   KNEE CARTILAGE SURGERY Right    OPEN REDUCTION INTERNAL FIXATION (ORIF) DISTAL RADIAL FRACTURE Left 08/27/2022   Procedure: OPEN REDUCTION INTERNAL FIXATION (ORIF) DISTAL RADIUS FRACTURE;  Surgeon: Altamese [place], MD;  Location: [place];  Service: Orthopedics;  Laterality: Left;   ORIF ULNAR FRACTURE Left 08/27/2022   Procedure: OPEN REDUCTION INTERNAL FIXATION (ORIF) ULNAR FRACTURE;  Surgeon: Altamese [place], MD;  Location: [place];  Service: Orthopedics;  Laterality: Left;   RADIAL HEAD ARTHROPLASTY Right 08/27/2022   Procedure: RADIAL HEAD ARTHROPLASTY;  Surgeon: Altamese [place], MD;  Location: [place];  Service: Orthopedics;  Laterality: Right;   REVERSE SHOULDER ARTHROPLASTY Right 09/10/2022   Procedure: REVERSE SHOULDER ARTHROPLASTY;  Surgeon: Nicholes Stairs, MD;  Location[facility];  Service: Orthopedics;  Laterality: Right;   TONSILLECTOMY     Past Medical History:  Diagnosis Date   Anxiety    Arthritis    Bladder prolapse, female, acquired    COVID-19 03/2022   Depression    GERD (gastroesophageal reflux disease)    Hypertension    Hypothyroidism    Myalgia    Occasional tremors    OSA (obstructive sleep apnea)    mild   Restless leg syndrome    Thrombocytopenia (HCC) in the past    Ht 5\' 6"  (1.676 m)   Wt 197 lb (89.4 kg)   BMI 31.80 kg/m   Opioid  Risk Score:   Fall Risk Score:  `1  Depression screen [facility] 2/9     01/20/2023    2:36 PM 10/21/2022    2:09 PM 02/24/2016    8:23 AM 01/13/2016    8:14 AM 08/09/2015    8:17 AM  Depression screen PHQ 2/9  Decreased Interest 0 1 0 0 0  Down, Depressed, Hopeless 0 0 0 0 0  PHQ - 2 Score 0 1 0 0 0  Altered sleeping  1     Tired, decreased energy  1     Change in appetite  1     Feeling bad or failure about yourself   0     Trouble concentrating  0     Moving  slowly or fidgety/restless  0     Suicidal thoughts  0     PHQ-9 Score  4         Review of Systems  Musculoskeletal:        Right Arm pain  All other systems reviewed and are negative.     Objective:   Physical Exam  General: No acute distress HEENT: NCAT, EOMI, oral membranes moist Cards: reg rate  Chest: normal effort Abdomen: Soft, NT, ND Skin: dry, intact Extremities: no edema Psych: pleasant and appropriate  Skin: residual wounds from ex fix pin sites Neuro: Alert and oriented x 3. Normal insight and awareness. Intact Memory. Normal language and speech. Cranial nerve exam unremarkable  Musculoskeletal: full PROM right shoulder. -15 degrees from full elbow extension. Right deltoid and rtc still weak 3+/5. Marland Kitchen                 Assessment:   Medical Problem List and Plan: 1. 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             -She is improved enough for we do not need to pursue outpatient speech therapy.  She will continue with the routine and some of the strategies is to help boost her memory.  However cognitively she appears quite functional at this point. 2.    Pain Management: gabapentin for RLS  -cymbalta for back and radicular pain.  3. Mood/Behavior/Sleep: Marland Kitchen              - trazodone increased to 100 mg nightly.  New prescription was provided today. -continue melatonin 10mg   qhs   -Consider repeat sleep study          4. Right elbow Fx/dislocation: Per ortho: advancing 9. Left distal radius/ulna Fx s/p ORIF 9/14:               -per ortho 10. Right proximal humerus Fx:  s/p right TSA   -She made very nice gains with her right shoulder range of motion.     20 minutes of face to face patient care time were spent during this visit. All questions were encouraged and answered. Follow up with me in 4 mos.

## 2023-01-20 NOTE — Patient Instructions (Signed)
ALWAYS FEEL FREE TO CALL OUR OFFICE WITH ANY PROBLEMS OR QUESTIONS ([phone])  **PLEASE NOTE** ALL MEDICATION REFILL REQUESTS (INCLUDING CONTROLLED SUBSTANCES) NEED TO BE MADE AT LEAST 7 DAYS PRIOR TO REFILL BEING DUE. ANY REFILL REQUESTS INSIDE THAT TIME FRAME MAY RESULT IN DELAYS IN RECEIVING YOUR PRESCRIPTION.                    

## 2023-03-12 ENCOUNTER — Other Ambulatory Visit: Payer: Self-pay | Admitting: Physical Medicine & Rehabilitation

## 2023-03-12 DIAGNOSIS — G4709 Other insomnia: Secondary | ICD-10-CM

## 2023-05-26 ENCOUNTER — Encounter: Payer: Medicare Other | Attending: Physical Medicine & Rehabilitation | Admitting: Physical Medicine & Rehabilitation

## 2023-05-26 ENCOUNTER — Encounter: Payer: Self-pay | Admitting: Physical Medicine & Rehabilitation

## 2023-05-26 VITALS — BP 104/68 | HR 68 | Wt 198.0 lb

## 2023-05-26 DIAGNOSIS — S069X1S Unspecified intracranial injury with loss of consciousness of 30 minutes or less, sequela: Secondary | ICD-10-CM | POA: Diagnosis present

## 2023-05-26 DIAGNOSIS — S069X1D Unspecified intracranial injury with loss of consciousness of 30 minutes or less, subsequent encounter: Secondary | ICD-10-CM | POA: Diagnosis not present

## 2023-05-26 DIAGNOSIS — S42351D Displaced comminuted fracture of shaft of humerus, right arm, subsequent encounter for fracture with routine healing: Secondary | ICD-10-CM

## 2023-05-26 MED ORDER — PROPRANOLOL HCL 10 MG PO TABS: 10.0000 mg | ORAL_TABLET | Freq: Three times a day (TID) | ORAL | 5 refills | Status: DC

## 2023-05-26 MED ORDER — GABAPENTIN 600 MG PO TABS: 600.0000 mg | ORAL_TABLET | Freq: Every day | ORAL | 3 refills | Status: DC

## 2023-05-26 MED ORDER — DULOXETINE HCL 60 MG PO CPEP: 60.0000 mg | ORAL_CAPSULE | Freq: Every day | ORAL | 2 refills | Status: AC

## 2023-05-26 NOTE — Patient Instructions (Signed)
ALWAYS FEEL FREE TO CALL OUR OFFICE WITH ANY PROBLEMS OR QUESTIONS ([phone])  **PLEASE NOTE** ALL MEDICATION REFILL REQUESTS (INCLUDING CONTROLLED SUBSTANCES) NEED TO BE MADE AT LEAST 7 DAYS PRIOR TO REFILL BEING DUE. ANY REFILL REQUESTS INSIDE THAT TIME FRAME MAY RESULT IN DELAYS IN RECEIVING YOUR PRESCRIPTION.                    

## 2023-05-26 NOTE — Progress Notes (Signed)
Subjective:    Patient ID: Vanessa [name], female    DOB: Aug 24, 1951, 72 y.o.   MRN: [id number]  HPI  [name] is here in follow-up of her brain injury and polytrauma.  She reports that things have been going fairly well since I last saw her.  She is active and doing some babysitting currently.  Her pain is minimal.  She has been sleeping fairly well.  She continues on trazodone at bedtime.  She occasionally takes an additional dose if she is having a hard time falling asleep.  She is also on melatonin at bedtime.  For restless legs she is using gabapentin 600 mg at bedtime.  She also takes Cymbalta 60 mg daily.  She has lost some range of motion on her right elbow because she has not been as aggressive with her exercises.  Otherwise she has had improvement in the functionality of her right shoulder and her strength in general.  Stamina has generally improved also.  Mood is in a good place.   Pain Inventory Average Pain 0 Pain Right Now 0 My pain is intermittent and aching  LOCATION OF PAIN  Headache  BOWEL Number of stools per week: 7   BLADDER Normal  Bladder incontinence Yes     Mobility walk without assistance ability to climb steps?  yes do you drive?  yes Do you have any goals in this area?  yes  Function retired  Neuro/Psych bladder control problems tremor anxiety  Prior Studies Any changes since last visit?  no  Physicians involved in your care Any changes since last visit?  no   Family History  Problem Relation Age of Onset   Cancer Mother    Social History   Socioeconomic History   Marital status: Married    Spouse name: Not on file   Number of children: Not on file   Years of education: Not on file   Highest education level: Not on file  Occupational History   Not on file  Tobacco Use   Smoking status: Never   Smokeless tobacco: Never  Vaping Use   Vaping Use: Never used  Substance and Sexual Activity   Alcohol use: No     Alcohol/week: 0.0 standard drinks of alcohol   Drug use: No   Sexual activity: Yes    Birth control/protection: None  Other Topics Concern   Not on file  Social History Narrative   Not on file   Social Determinants of Health   Financial Resource Strain: Not on file  Food Insecurity: No Food Insecurity (09/10/2022)   Hunger Vital Sign    Worried About Running Out of Food in the Last Year: Never true    Ran Out of Food in the Last Year: Never true  Transportation Needs: No Transportation Needs (09/10/2022)   PRAPARE - Administrator, Civil Service (Medical): No    Lack of Transportation (Non-Medical): No  Physical Activity: Not on file  Stress: Not on file  Social Connections: Not on file   Past Surgical History:  Procedure Laterality Date   ELBOW LIGAMENT RECONSTRUCTION Right 08/27/2022   Procedure: ELBOW LIGAMENT RECONSTRUCTION;  Surgeon: Myrene Galas, MD;  [facility];  Service: Orthopedics;  Laterality: Right;   EXTERNAL FIXATION REMOVAL Right 10/13/2022   Procedure: REMOVAL EXTERNAL FIXATION ARM;  Surgeon: Myrene Galas, MD;  [facility];  Service: Orthopedics;  Laterality: Right;   FRACTURE SURGERY  2011   left ankle Fx s/p  fixation   KNEE CARTILAGE SURGERY Right    OPEN REDUCTION INTERNAL FIXATION (ORIF) DISTAL RADIAL FRACTURE Left 08/27/2022   Procedure: OPEN REDUCTION INTERNAL FIXATION (ORIF) DISTAL RADIUS FRACTURE;  Surgeon: Myrene Galas, MD;  [facility];  Service: Orthopedics;  Laterality: Left;   ORIF ULNAR FRACTURE Left 08/27/2022   Procedure: OPEN REDUCTION INTERNAL FIXATION (ORIF) ULNAR FRACTURE;  Surgeon: Myrene Galas, MD;  [facility];  Service: Orthopedics;  Laterality: Left;   RADIAL HEAD ARTHROPLASTY Right 08/27/2022   Procedure: RADIAL HEAD ARTHROPLASTY;  Surgeon: Myrene Galas, MD;  Location: [facility];  Service: Orthopedics;  Laterality: Right;   REVERSE SHOULDER ARTHROPLASTY Right 09/10/2022   Procedure: REVERSE SHOULDER  ARTHROPLASTY;  Surgeon: Yolonda Kida, MD;  Location: [facility];  Service: Orthopedics;  Laterality: Right;   TONSILLECTOMY     Past Medical History:  Diagnosis Date   Anxiety    Arthritis    Bladder prolapse, female, acquired    COVID-19 03/2022   Depression    GERD (gastroesophageal reflux disease)    Hypertension    Hypothyroidism    Myalgia    Occasional tremors    OSA (obstructive sleep apnea)    mild   Restless leg syndrome    Thrombocytopenia (HCC) in the past    BP 104/68   Pulse 68   Wt 198 lb (89.8 kg)   SpO2 95%   BMI 31.96 kg/m   Opioid Risk Score:   Fall Risk Score:  `1  Depression screen PHQ 2/9     05/26/2023    2:08 PM 05/26/2023    2:02 PM 01/20/2023    2:36 PM 10/21/2022    2:09 PM 02/24/2016    8:23 AM 01/13/2016    8:14 AM 08/09/2015    8:17 AM  Depression screen PHQ 2/9  Decreased Interest 0 0 0 1 0 0 0  Down, Depressed, Hopeless 0 0 0 0 0 0 0  PHQ - 2 Score 0 0 0 1 0 0 0  Altered sleeping    1     Tired, decreased energy    1     Change in appetite    1     Feeling bad or failure about yourself     0     Trouble concentrating    0     Moving slowly or fidgety/restless    0     Suicidal thoughts    0     PHQ-9 Score    4       Review of Systems  Gastrointestinal:        Incontinence   Neurological:  Positive for tremors and headaches.  Psychiatric/Behavioral:         Anxiety  All other systems reviewed and are negative.      Objective:   Physical Exam  General: No acute distress HEENT: NCAT, EOMI, oral membranes moist Cards: reg rate  Chest: normal effort Abdomen: Soft, NT, ND Skin: dry, intact Extremities: no edema Psych: Very pleasant and appropriate  Skin: residual wounds from ex fix pin sites Neuro: Alert and oriented x 3. Normal insight and awareness. Intact Memory. Normal language and speech. Cranial nerve exam unremarkable  Musculoskeletal: full PROM right shoulder. -20 degrees from full elbow extension. Right  deltoid weak 4/5. Marland Kitchen                 Assessment:    Medical Problem List and Plan: 1. Functional deficits secondary to traumatic brain  injury and polytrauma including right elbow and proximal humerus fx, left distal radial/ulna fx after fall down stairs.             -At baseline cognitively 2. Pain Management: gabapentin for RLS             -cymbalta for back and radicular pain. ---continue--refilled today 3. Mood/Behavior/Sleep: Marland Kitchen              - trazodone 100 mg nightly.  Uses occasional 50mg  if 100mg  is ineffective.  -continue melatonin 10mg  qhs             4. Right elbow Fx/dislocation: Per ortho: Continue with home exercise program 5. Urinary urgency: discussed fluid rationing to avoid night time voiding 6.  Restless leg syndrome: Refilled gabapentin 600 mg at bedtime 9. Left distal radius/ulna Fx s/p ORIF 9/14:               -per ortho 10. Right proximal humerus Fx:  s/p right TSA              -has functional movement of right shoulder     20 minutes of face to face patient care time were spent during this visit. All questions were encouraged and answered. Follow up with me in 6 mos.

## 2023-06-07 ENCOUNTER — Other Ambulatory Visit: Payer: Self-pay | Admitting: Physical Medicine & Rehabilitation

## 2023-06-07 DIAGNOSIS — G4709 Other insomnia: Secondary | ICD-10-CM

## 2023-10-09 ENCOUNTER — Other Ambulatory Visit: Payer: Self-pay | Admitting: Physical Medicine & Rehabilitation

## 2023-10-09 DIAGNOSIS — G4709 Other insomnia: Secondary | ICD-10-CM

## 2023-11-24 ENCOUNTER — Encounter: Payer: Medicare Other | Attending: Physical Medicine & Rehabilitation | Admitting: Physical Medicine & Rehabilitation

## 2023-11-24 ENCOUNTER — Encounter: Payer: Self-pay | Admitting: Physical Medicine & Rehabilitation

## 2023-11-24 VITALS — BP 116/83 | HR 82 | Ht 66.0 in | Wt 208.0 lb

## 2023-11-24 DIAGNOSIS — G4709 Other insomnia: Secondary | ICD-10-CM | POA: Diagnosis present

## 2023-11-24 DIAGNOSIS — S069X1S Unspecified intracranial injury with loss of consciousness of 30 minutes or less, sequela: Secondary | ICD-10-CM

## 2023-11-24 DIAGNOSIS — S069X1D Unspecified intracranial injury with loss of consciousness of 30 minutes or less, subsequent encounter: Secondary | ICD-10-CM

## 2023-11-24 MED ORDER — LOSARTAN POTASSIUM 25 MG PO TABS: 25.0000 mg | ORAL_TABLET | Freq: Every day | ORAL | Status: DC

## 2023-11-24 MED ORDER — PROPRANOLOL HCL 20 MG PO TABS: 20.0000 mg | ORAL_TABLET | Freq: Three times a day (TID) | ORAL | 3 refills | Status: DC

## 2023-11-24 NOTE — Progress Notes (Signed)
Subjective:    Patient ID: Vanessa [name], female    DOB: February 28, 1951, 72 y.o.   MRN: [id number]  HPI  Vanessa [name] is here in follow up of her polytrauma and tbi. Things have been going fairly well. She still is reporting some shaking/tremor. The tremor seems worse over the last 6 mos. She is on Inderal 10 mg 3 times daily.  However she only has been taking it twice daily.  She says that her husband notes that her head is shaking more than it once was.  She is also on gabapentin 600 mg at night for restless leg syndrome.  She has not had any problems with her blood pressure or heart rate.  She has been struggling with some loose stool for the last few months.  She is unclear as to what exactly is causing it and wondered if some of her medications are contributing.  Her medications have been chronic however.  She has an upcoming hysterectomy in February with some tacking of her bladder planned as well and what sounds to be a complex surgery.  She will be going to [facility] for this.  Pain wise she is doing fairly well.  Her right shoulder range of motion is improved.  Right elbow is stable from a movement standpoint.  She is sleeping well.  Mood is very positive.  Cognition is almost back to baseline.  She tries to be careful with her activities around the house and exercise in general and use commonsense when possible.     Pain Inventory Average Pain 0 Pain Right Now 0 My pain is  .  In the last 24 hours, has pain interfered with the following? General activity 0 Relation with others 0 Enjoyment of life 0 What TIME of day is your pain at its worst? . Sleep (in general) Good  Pain is worse with:  . Pain improves with:  . Relief from Meds:  .  Family History  Problem Relation Age of Onset   Cancer Mother    Social History   Socioeconomic History   Marital status: Married    Spouse name: Not on file   Number of children: Not on file   Years of education: Not on file    Highest education level: Not on file  Occupational History   Not on file  Tobacco Use   Smoking status: Never   Smokeless tobacco: Never  Vaping Use   Vaping status: Never Used  Substance and Sexual Activity   Alcohol use: No    Alcohol/week: 0.0 standard drinks of alcohol   Drug use: No   Sexual activity: Yes    Birth control/protection: None  Other Topics Concern   Not on file  Social History Narrative   Not on file   Social Determinants of Health   Financial Resource Strain: Low Risk  (05/26/2022)   [facility] prior to [facility]/13/[facility]23[facility]   Overall Financial Resource Strain (CARDIA)    Difficulty of Paying Living Expenses: Not hard at all  Food Insecurity: No Food Insecurity (09/10/2022)   Hunger Vital Sign    Worried About Running Out of Food in the Last Year: Never true    Ran Out of Food in the Last Year: Never true  Transportation Needs: No Transportation Needs (09/10/2022)   PRAPARE - Administrator, Civil Service (Medical): No  Lack of Transportation (Non-Medical): No  Physical Activity: Unknown (05/26/2022)   [facility], [facility] prior to 02/13/2023., [facility] visits prior to 02/13/2023., [facility]   Exercise Vital Sign    Days of Exercise per Week: 2 days    Minutes of Exercise per Session: Not on file  Stress: No Stress Concern Present (05/26/2022)   [facility] to [facility]13/[facility]23[facility], [facility] visits prior to 02/13/2023., [facility]   [employer] of Occupational Health - Occupational Stress Questionnaire    Feeling of Stress : Only a little  Social Connections: Socially Integrated (05/26/2022)   Received from [facility], [facility] visits prior  to 02/13/2023., [facility] visits prior to 02/13/2023., [facility]   Social Connection and Isolation Panel [NHANES]    Frequency of Communication with Friends and Family: Three times a week    Frequency of Social Gatherings with Friends and Family: Once a week    Attends Religious Services: More than 4 times per year    Active Member of Clubs or Organizations: Yes    Attends Banker Meetings: More than 4 times per year    Marital Status: Married   Past Surgical History:  Procedure Laterality Date   ELBOW LIGAMENT RECONSTRUCTION Right 08/27/2022   Procedure: ELBOW LIGAMENT RECONSTRUCTION;  Surgeon: Myrene Galas, MD;  [facility]: [facility];  Service: Orthopedics;  Laterality: Right;   EXTERNAL FIXATION REMOVAL Right 10/13/2022   Procedure: REMOVAL EXTERNAL FIXATION ARM;  Surgeon: Myrene Galas, MD;  [facility]: [facility] OR;  Service: Orthopedics;  Laterality: Right;   FRACTURE SURGERY  2011   left ankle Fx s/p fixation   KNEE CARTILAGE SURGERY Right    OPEN REDUCTION INTERNAL FIXATION (ORIF) DISTAL RADIAL FRACTURE Left 08/27/2022   Procedure: OPEN REDUCTION INTERNAL FIXATION (ORIF) DISTAL RADIUS FRACTURE;  Surgeon: Myrene Galas, MD;  Location: [facility] OR;  Service: Orthopedics;  Laterality: Left;   ORIF ULNAR FRACTURE Left 08/27/2022   Procedure: OPEN REDUCTION INTERNAL FIXATION (ORIF) ULNAR FRACTURE;  Surgeon: Myrene Galas, MD;  [facility]: [facility] OR;  Service: Orthopedics;  Laterality: Left;   RADIAL HEAD ARTHROPLASTY Right 08/27/2022   Procedure: RADIAL HEAD ARTHROPLASTY;  Surgeon: Myrene Galas, MD;  Location[facility] OR;  Service: Orthopedics;  Laterality: Right;   REVERSE SHOULDER ARTHROPLASTY Right 09/10/2022   Procedure: REVERSE SHOULDER ARTHROPLASTY;  Surgeon: Yolonda Kida, MD;  Location: [facility] ORS;  Service: Orthopedics;  Laterality: Right;   TONSILLECTOMY     Past Surgical History:  Procedure Laterality Date   ELBOW LIGAMENT RECONSTRUCTION Right 08/27/2022    Procedure: ELBOW LIGAMENT RECONSTRUCTION;  Surgeon: Myrene Galas, MD;  [facility];  Service: Orthopedics;  Laterality: Right;   EXTERNAL FIXATION REMOVAL Right 10/13/2022   Procedure: REMOVAL EXTERNAL FIXATION ARM;  Surgeon: Myrene Galas, MD;  [facility];  Service: Orthopedics;  Laterality: Right;   FRACTURE SURGERY  2011   left ankle Fx s/p fixation   KNEE CARTILAGE SURGERY Right    OPEN REDUCTION INTERNAL FIXATION (ORIF) DISTAL RADIAL FRACTURE Left 08/27/2022   Procedure: OPEN REDUCTION INTERNAL FIXATION (ORIF) DISTAL RADIUS FRACTURE;  Surgeon: Myrene Galas, MD;  [facility];  Service: Orthopedics;  Laterality: Left;   ORIF ULNAR FRACTURE Left 08/27/2022   Procedure: OPEN REDUCTION INTERNAL FIXATION (ORIF) ULNAR FRACTURE;  Surgeon: Myrene Galas, MD;  [facility];  Service: Orthopedics;  Laterality: Left;   RADIAL HEAD ARTHROPLASTY Right 08/27/2022   Procedure: RADIAL HEAD ARTHROPLASTY;  Surgeon: Myrene Galas, MD;  Location: [facility] OR;  Service: Orthopedics;  Laterality: Right;   REVERSE SHOULDER ARTHROPLASTY Right 09/10/2022   Procedure: REVERSE SHOULDER ARTHROPLASTY;  Surgeon: Yolonda Kida, MD;  [facility]: [facility];  Service: Orthopedics;  Laterality: Right;   TONSILLECTOMY     Past Medical History:  Diagnosis Date   Anxiety    Arthritis    Bladder prolapse, female, acquired    COVID-19 03/2022   Depression    GERD (gastroesophageal reflux disease)    Hypertension    Hypothyroidism    Myalgia    Occasional tremors    OSA (obstructive sleep apnea)    mild   Restless leg syndrome    Thrombocytopenia (HCC) in the past    BP 116/83   Pulse 82   Ht 5\' 6"  (1.676 m)   Wt 208 lb (94.3 kg)   SpO2 95%   BMI 33.57 kg/m   Opioid Risk Score:   Fall Risk Score:  `1  Depression screen PHQ 2/9     05/26/2023    2:08 PM 05/26/2023    2:02 PM 01/20/2023    2:36 PM 10/21/2022    2:09 PM 02/24/2016    8:23 AM 01/13/2016    8:14 AM 08/09/2015    8:17 AM   Depression screen PHQ 2/9  Decreased Interest 0 0 0 1 0 0 0  Down, Depressed, Hopeless 0 0 0 0 0 0 0  PHQ - 2 Score 0 0 0 1 0 0 0  Altered sleeping    1     Tired, decreased energy    1     Change in appetite    1     Feeling bad or failure about yourself     0     Trouble concentrating    0     Moving slowly or fidgety/restless    0     Suicidal thoughts    0     PHQ-9 Score    4       Review of Systems  Neurological:  Positive for tremors.  Psychiatric/Behavioral:  The patient is nervous/anxious.        Take Duloxetine  All other systems reviewed and are negative.     Objective:   Physical Exam Constitutional: No distress . Vital signs reviewed. HEENT: NCAT, EOMI, oral membranes moist Neck: supple Cardiovascular: RRR without murmur. No JVD    Respiratory/Chest: CTA Bilaterally without wheezes or rales. Normal effort    GI/Abdomen: BS +, non-tender, non-distended Ext: no clubbing, cyanosis, or edema Psych: pleasant and cooperative  Skin: residual wounds from ex fix pin sites Neuro: Alert and oriented x 3. Normal insight and awareness. Intact Memory. Normal language and speech. Cranial nerve exam unremarkable. motor 5/5 IN ALL 4'S Musculoskeletal: full PROM right shoulder. -~15 degrees from full elbow extension. Right deltoid weak 4/5. Marland Kitchen                 Assessment:   1. 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             -At baseline cognitively.   -she keeps a notebook, maintain a schedule/routine  -she uses great common sense! 2. Pain Management: gabapentin for RLS             -  cymbalta for back and radicular pain. - continue for now 3. Mood/Behavior/Sleep: .              -trazodone 100 mg nightly.  Uses occasional 50mg  if 100mg  is ineffective.  -OFF melatonin           4. Right elbow Fx/dislocation: Per ortho: Continue with home  exercise program 5. Urinary urgency: discussed fluid rationing to avoid night time voiding 6.  Restless leg syndrome: Refilled gabapentin 600 mg at bedtime, 2 300mg  caps 7.  We discussed her loose stool and of the medications that she is taking her Pepcid followed by Cymbalta would be the most likely culprits.  She does not want to give up her Cymbalta.  She does feel that she can stop the Pepcid so we will see how this goes.  If things do not improve she may need to see a gastroenterologist for assessment.  We also recommended that she try some daily fiber such as Metamucil to help bulk up her stool. 8.  Persistent tremor: Will increase Inderal to 10 mg 3 times daily for 4 days then increase to 20 mg 3 times daily thereafter.  If symptoms improved we can consider moving to a long-acting form.  We also can look at other options.  She is already on gabapentin for her restless leg.  She will check her blood pressure and pulse at home. 9. Left distal radius/ulna Fx s/p ORIF 9/14:               -per ortho 10. Right proximal humerus Fx:  s/p right TSA              -has functional movement of right shoulder     20 minutes of face to face patient care time were spent during this visit. All questions were encouraged and answered. Follow up with me in 6 mos.

## 2023-11-24 NOTE — Patient Instructions (Addendum)
ALWAYS FEEL FREE TO CALL OUR OFFICE WITH ANY PROBLEMS OR QUESTIONS [phone])  **PLEASE NOTE** ALL MEDICATION REFILL REQUESTS (INCLUDING CONTROLLED SUBSTANCES) NEED TO BE MADE AT LEAST 7 DAYS PRIOR TO REFILL BEING DUE. ANY REFILL REQUESTS INSIDE THAT TIME FRAME MAY RESULT IN DELAYS IN RECEIVING YOUR PRESCRIPTION.    HOLD PEPCID AND SEE HOW DIARRHEA DOES CYMBALTA MAY BE CONTRIBUTING.  ?GI CONSULT AT SOME POINT ADD FIBER TO YOUR DIET---METAMUCIL IN THE AM EACH DAY WITH  FLUID.    TREMORS:  INCREASE PROPRANOLOL TO 10MG  THREE X DAILY FOR 4 DAYS, THEN INCREASE TO 20MG  THREE X DAILY.  IF YOUR BLOOD PRESSURE AND HEART RATE HOLD AND THE MEDICATION IS HELPING WE CAN SWITCH TO LONG ACTING FORM.   -CALL ME EITHER WAY IN A FEW WEEKS TO LET ME KNOW HOW THINGS ARE GOING.

## 2023-12-13 ENCOUNTER — Other Ambulatory Visit: Payer: Self-pay

## 2023-12-16 MED ORDER — GABAPENTIN 300 MG PO CAPS: 600.0000 mg | ORAL_CAPSULE | Freq: Every day | ORAL | 9 refills | Status: DC

## 2024-01-19 NOTE — Progress Notes (Signed)
 Surgical Instructions   Your procedure is scheduled on Tuesday, January 25, 2024. Report to [facility] Main Entrance A at 5:30 A.M., then check in with the Admitting office. Any questions or running late day of surgery: call [phone]  Questions prior to your surgery date: call [phone], Monday-Friday, 8am-4pm. If you experience any cold or flu symptoms such as cough, fever, chills, shortness of breath, etc. between now and your scheduled surgery, please notify us  at the above number.     Remember:  Do not eat or drink after midnight the night before your surgery  Take these medicines the morning of surgery with A SIP OF WATER   DULoxetine  (CYMBALTA )  gabapentin  (NEURONTIN )  levothyroxine  (SYNTHROID )  propranolol  (INDERAL )    May take these medicines IF NEEDED: acetaminophen  (TYLENOL )     One week prior to surgery, STOP taking any Aspirin (unless otherwise instructed by your surgeon) Aleve, Naproxen, Ibuprofen , Motrin , Advil , Goody's, BC's, all herbal medications, fish oil, and non-prescription vitamins. This includes your meloxicam (MOBIC).                     Do NOT Smoke (Tobacco/Vaping) for 24 hours prior to your procedure.  If you use a CPAP at night, you may bring your mask/headgear for your overnight stay.   You will be asked to remove any contacts, glasses, piercing's, hearing aid's, dentures/partials prior to surgery. Please bring cases for these items if needed.    Patients discharged the day of surgery will not be allowed to drive home, and someone needs to stay with them for 24 hours.  SURGICAL WAITING ROOM VISITATION Patients may have no more than 2 support people in the waiting area - these visitors may rotate.   Pre-op nurse will coordinate an appropriate time for 1 ADULT support person, who may not rotate, to accompany patient in pre-op.  Children under the age of 55 must have an adult with them who is not the patient and must remain in the main  waiting area with an adult.  If the patient needs to stay at the hospital during part of their recovery, the visitor guidelines for inpatient rooms apply.  Please refer to the [facility] website for the visitor guidelines for any additional information.   If you received a COVID test during your pre-op visit  it is requested that you wear a mask when out in public, stay away from anyone that may not be feeling well and notify your surgeon if you develop symptoms. If you have been in contact with anyone that has tested positive in the last 10 days please notify you surgeon.      Pre-operative CHG Bathing Instructions   You can play a key role in reducing the risk of infection after surgery. Your skin needs to be as free of germs as possible. You can reduce the number of germs on your skin by washing with CHG (chlorhexidine  gluconate) soap before surgery. CHG is an antiseptic soap that kills germs and continues to kill germs even after washing.   DO NOT use if you have an allergy to chlorhexidine /CHG or antibacterial soaps. If your skin becomes reddened or irritated, stop using the CHG and notify one of our RNs at [phone].              TAKE A SHOWER THE NIGHT BEFORE SURGERY AND THE DAY OF SURGERY    Please keep in mind the following:  DO NOT shave, including legs and underarms, 48  hours prior to surgery.   You may shave your face before/day of surgery.  Place clean sheets on your bed the night before surgery Use a clean washcloth (not used since being washed) for each shower. DO NOT sleep with pet's night before surgery.  CHG Shower Instructions:  Wash your face and private area with normal soap. If you choose to wash your hair, wash first with your normal shampoo.  After you use shampoo/soap, rinse your hair and body thoroughly to remove shampoo/soap residue.  Turn the water  OFF and apply half the bottle of CHG soap to a CLEAN washcloth.  Apply CHG soap ONLY FROM YOUR NECK DOWN TO  YOUR TOES (washing for 3-5 minutes)  DO NOT use CHG soap on face, private areas, open wounds, or sores.  Pay special attention to the area where your surgery is being performed.  If you are having back surgery, having someone wash your back for you may be helpful. Wait 2 minutes after CHG soap is applied, then you may rinse off the CHG soap.  Pat dry with a clean towel  Put on clean pajamas    Additional instructions for the day of surgery: DO NOT APPLY any lotions, deodorants, cologne, or perfumes.   Do not wear jewelry or makeup Do not wear nail polish, gel polish, artificial nails, or any other type of covering on natural nails (fingers and toes) Do not bring valuables to the hospital. [facility] is not responsible for valuables/personal belongings. Put on clean/comfortable clothes.  Please brush your teeth.  Ask your nurse before applying any prescription medications to the skin.

## 2024-01-20 ENCOUNTER — Other Ambulatory Visit: Payer: Self-pay

## 2024-01-20 ENCOUNTER — Encounter (HOSPITAL_COMMUNITY)
Admission: RE | Admit: 2024-01-20 | Discharge: 2024-01-20 | Disposition: A | Discharge status: Home / Self Care | Payer: No Typology Code available for payment source | Source: Ambulatory Visit | Attending: Obstetrics and Gynecology | Admitting: Obstetrics and Gynecology

## 2024-01-20 ENCOUNTER — Encounter (HOSPITAL_COMMUNITY): Payer: Self-pay

## 2024-01-20 VITALS — BP 124/73 | HR 72 | Temp 97.9°F | Resp 20 | Ht 66.0 in | Wt 212.5 lb

## 2024-01-20 DIAGNOSIS — I10 Essential (primary) hypertension: Secondary | ICD-10-CM | POA: Insufficient documentation

## 2024-01-20 DIAGNOSIS — N8111 Cystocele, midline: Secondary | ICD-10-CM | POA: Insufficient documentation

## 2024-01-20 DIAGNOSIS — Z01812 Encounter for preprocedural laboratory examination: Secondary | ICD-10-CM | POA: Diagnosis present

## 2024-01-20 DIAGNOSIS — Z01818 Encounter for other preprocedural examination: Secondary | ICD-10-CM | POA: Diagnosis not present

## 2024-01-20 DIAGNOSIS — Z01810 Encounter for preprocedural cardiovascular examination: Secondary | ICD-10-CM | POA: Diagnosis present

## 2024-01-20 HISTORY — DX: Prediabetes: R73.03

## 2024-01-20 LAB — CBC
HCT: 45.5 % (ref 36.0–46.0)
Hemoglobin: 14.6 g/dL (ref 12.0–15.0)
MCH: 30.5 pg (ref 26.0–34.0)
MCHC: 32.1 g/dL (ref 30.0–36.0)
MCV: 95.2 fL (ref 80.0–100.0)
Platelets: 158 10*3/uL (ref 150–400)
RBC: 4.78 MIL/uL (ref 3.87–5.11)
RDW: 12.9 % (ref 11.5–15.5)
WBC: 4.8 10*3/uL (ref 4.0–10.5)
nRBC: 0 % (ref 0.0–0.2)

## 2024-01-20 LAB — SURGICAL PCR SCREEN

## 2024-01-20 LAB — BASIC METABOLIC PANEL
Anion gap: 8 (ref 5–15)
BUN: 13 mg/dL (ref 8–23)
CO2: 29 mmol/L (ref 22–32)
Calcium: 9.2 mg/dL (ref 8.9–10.3)
Chloride: 104 mmol/L (ref 98–111)
Creatinine, Ser: 0.87 mg/dL (ref 0.44–1.00)
GFR, Estimated: >60 mL/min (ref >60)
Glucose, Bld: 104 mg/dL — ABNORMAL HIGH (ref 70–99)
Potassium: 4.2 mmol/L (ref 3.5–5.1)
Sodium: 141 mmol/L (ref 135–145)

## 2024-01-20 LAB — TYPE AND SCREEN
ABO/RH(D): B POS
Antibody Screen: NEGATIVE

## 2024-01-20 LAB — HIV ANTIBODY (ROUTINE TESTING W REFLEX): HIV Screen 4th Generation wRfx: NONREACTIVE

## 2024-01-20 NOTE — Progress Notes (Signed)
 Surgical Instructions   Your procedure is scheduled on Tuesday, January 25, 2024. Report to [facility] Main Entrance A at 5:30 A.M., then check in with the Admitting office. Any questions or running late day of surgery: call [phone]  Questions prior to your surgery date: call [phone], Monday-Friday, 8am-4pm. If you experience any cold or flu symptoms such as cough, fever, chills, shortness of breath, etc. between now and your scheduled surgery, please notify us  at the above number.     Remember:  Do not eat or drink after midnight the night before your surgery  Take these medicines the morning of surgery with A SIP OF WATER   DULoxetine  (CYMBALTA )  levothyroxine  (SYNTHROID )  propranolol  (INDERAL )   May take these medicines IF NEEDED: acetaminophen  (TYLENOL )  Carboxymethylcellulose (REFRESH) eye drops   One week prior to surgery, STOP taking any Aspirin (unless otherwise instructed by your surgeon) Aleve, Naproxen, Ibuprofen , Motrin , Advil , Goody's, BC's, all herbal medications, fish oil, and non-prescription vitamins. This includes your meloxicam (MOBIC).                     Do NOT Smoke (Tobacco/Vaping) for 24 hours prior to your procedure.  If you use a CPAP at night, you may bring your mask/headgear for your overnight stay.   You will be asked to remove any contacts, glasses, piercing's, hearing aid's, dentures/partials prior to surgery. Please bring cases for these items if needed.    Patients discharged the day of surgery will not be allowed to drive home, and someone needs to stay with them for 24 hours.  SURGICAL WAITING ROOM VISITATION Patients may have no more than 2 support people in the waiting area - these visitors may rotate.   Pre-op nurse will coordinate an appropriate time for 1 ADULT support person, who may not rotate, to accompany patient in pre-op.  Children under the age of 41 must have an adult with them who is not the patient and must remain in  the main waiting area with an adult.  If the patient needs to stay at the hospital during part of their recovery, the visitor guidelines for inpatient rooms apply.  Please refer to the [facility] website for the visitor guidelines for any additional information.   If you received a COVID test during your pre-op visit  it is requested that you wear a mask when out in public, stay away from anyone that may not be feeling well and notify your surgeon if you develop symptoms. If you have been in contact with anyone that has tested positive in the last 10 days please notify you surgeon.      Pre-operative CHG Bathing Instructions   You can play a key role in reducing the risk of infection after surgery. Your skin needs to be as free of germs as possible. You can reduce the number of germs on your skin by washing with CHG (chlorhexidine  gluconate) soap before surgery. CHG is an antiseptic soap that kills germs and continues to kill germs even after washing.   DO NOT use if you have an allergy to chlorhexidine /CHG or antibacterial soaps. If your skin becomes reddened or irritated, stop using the CHG and notify one of our RNs at [phone].              TAKE A SHOWER THE NIGHT BEFORE SURGERY AND THE DAY OF SURGERY    Please keep in mind the following:  DO NOT shave, including legs and underarms, 48 hours  prior to surgery.   You may shave your face before/day of surgery.  Place clean sheets on your bed the night before surgery Use a clean washcloth (not used since being washed) for each shower. DO NOT sleep with pet's night before surgery.  CHG Shower Instructions:  Wash your face and private area with normal soap. If you choose to wash your hair, wash first with your normal shampoo.  After you use shampoo/soap, rinse your hair and body thoroughly to remove shampoo/soap residue.  Turn the water  OFF and apply half the bottle of CHG soap to a CLEAN washcloth.  Apply CHG soap ONLY FROM YOUR  NECK DOWN TO YOUR TOES (washing for 3-5 minutes)  DO NOT use CHG soap on face, private areas, open wounds, or sores.  Pay special attention to the area where your surgery is being performed.  If you are having back surgery, having someone wash your back for you may be helpful. Wait 2 minutes after CHG soap is applied, then you may rinse off the CHG soap.  Pat dry with a clean towel  Put on clean pajamas    Additional instructions for the day of surgery: DO NOT APPLY any lotions, deodorants, cologne, or perfumes.   Do not wear jewelry or makeup Do not wear nail polish, gel polish, artificial nails, or any other type of covering on natural nails (fingers and toes) Do not bring valuables to the hospital. [facility] is not responsible for valuables/personal belongings. Put on clean/comfortable clothes.  Please brush your teeth.  Ask your nurse before applying any prescription medications to the skin.

## 2024-01-20 NOTE — Progress Notes (Signed)
 PCP - Dr. Talitha Flatten Neurologist: Dr. Arthea Gunther Cardiologist - denies  Chest x-ray -  EKG - 01/20/24 Stress Test - denies ECHO - denies Cardiac Cath - denies  Sleep Study - reports she had one years ago but it was inconclusive CPAP -   Fasting Blood Sugar - Pre-Diabetes, does not check at home  Blood Thinner Instructions: Aspirin Instructions:  ERAS Protcol - NPO  COVID TEST- n/a   Patient denies shortness of breath, fever, cough and chest pain at PAT appointment

## 2024-01-24 NOTE — Anesthesia Preprocedure Evaluation (Addendum)
Anesthesia Evaluation  Patient identified by MRN, date of birth, ID band Patient awake    Reviewed: Allergy & Precautions, NPO status , Patient's Chart, lab work & pertinent test results, reviewed documented beta blocker date and time   History of Anesthesia Complications Negative for: history of anesthetic complications  Airway Mallampati: II  TM Distance: >3 FB Neck ROM: Full    Dental  (+) Missing, Dental Advisory Given   Pulmonary neg pulmonary ROS   breath sounds clear to auscultation       Cardiovascular hypertension, Pt. on medications and Pt. on home beta blockers (-) angina  Rhythm:Regular Rate:Normal     Neuro/Psych   Anxiety Depression    negative neurological ROS     GI/Hepatic Neg liver ROS,GERD  Medicated and Controlled,,  Endo/Other  Hypothyroidism  BMI 34  Renal/GU negative Renal ROS     Musculoskeletal  (+) Arthritis ,    Abdominal   Peds  Hematology negative hematology ROS (+)   Anesthesia Other Findings   Reproductive/Obstetrics                             Anesthesia Physical Anesthesia Plan  ASA: 3  Anesthesia Plan: General   Post-op Pain Management: Tylenol PO (pre-op)*   Induction: Intravenous  PONV Risk Score and Plan: 3 and Dexamethasone, Treatment may vary due to age or medical condition and Ondansetron  Airway Management Planned: Oral ETT  Additional Equipment: None  Intra-op Plan:   Post-operative Plan: Extubation in OR  Informed Consent: I have reviewed the patients History and Physical, chart, labs and discussed the procedure including the risks, benefits and alternatives for the proposed anesthesia with the patient or authorized representative who has indicated his/her understanding and acceptance.     Dental advisory given  Plan Discussed with: CRNA and Surgeon  Anesthesia Plan Comments:         Anesthesia Quick Evaluation

## 2024-01-24 NOTE — H&P (Signed)
 Vanessa [name] is an 73 y.o. female. She has symptomatic pelvic prolapse. No vaginal bleeding  Pertinent Gynecological History: Menses: post-menopausal Bleeding:  Contraception:  DES exposure: denies Blood transfusions: none Sexually transmitted diseases: no past history Previous GYN Procedures:  Last mammogram: normal Date: 2024 Last pap: Date: OB History: G1, P1   Menstrual History: Menarche age:  No LMP recorded. Patient is postmenopausal.    Past Medical History:  Diagnosis Date   Anxiety    Arthritis    Bladder prolapse, female, acquired    Brain bleed (HCC) 08/2022   History of brain bleed after falling down stairs   COVID-19 03/2022   Depression    GERD (gastroesophageal reflux disease)    Hypertension    Hypothyroidism    Myalgia    Occasional tremors    Pre-diabetes    Restless leg syndrome    Thrombocytopenia (HCC) in the past     Past Surgical History:  Procedure Laterality Date   ANKLE FRACTURE SURGERY Left    ELBOW LIGAMENT RECONSTRUCTION Right 08/27/2022   Procedure: ELBOW LIGAMENT RECONSTRUCTION;  Surgeon: Hardy Lia, MD;  Location: [facility] OR;  Service: Orthopedics;  Laterality: Right;   EXTERNAL FIXATION REMOVAL Right 10/13/2022   Procedure: REMOVAL EXTERNAL FIXATION ARM;  Surgeon: Hardy Lia, MD;  Location: [facility] OR;  Service: Orthopedics;  Laterality: Right;   FRACTURE SURGERY  2011   left ankle Fx s/p fixation   KNEE CARTILAGE SURGERY Right    OPEN REDUCTION INTERNAL FIXATION (ORIF) DISTAL RADIAL FRACTURE Left 08/27/2022   Procedure: OPEN REDUCTION INTERNAL FIXATION (ORIF) DISTAL RADIUS FRACTURE;  Surgeon: Hardy Lia, MD;  Location: [facility] OR;  Service: Orthopedics;  Laterality: Left;   ORIF ULNAR FRACTURE Left 08/27/2022   Procedure: OPEN REDUCTION INTERNAL FIXATION (ORIF) ULNAR FRACTURE;  Surgeon: Hardy Lia, MD;  Location: [facility] OR;  Service: Orthopedics;  Laterality: Left;   RADIAL HEAD ARTHROPLASTY Right 08/27/2022   Procedure: RADIAL  HEAD ARTHROPLASTY;  Surgeon: Hardy Lia, MD;  Location: [facility] OR;  Service: Orthopedics;  Laterality: Right;   REVERSE SHOULDER ARTHROPLASTY Right 09/10/2022   Procedure: REVERSE SHOULDER ARTHROPLASTY;  Surgeon: Janeth Medicus, MD;  Location: [facility] ORS;  Service: Orthopedics;  Laterality: Right;   TONSILLECTOMY      Family History  Problem Relation Age of Onset   Cancer Mother     Social History:  reports that she has never smoked. She has never used smokeless tobacco. She reports that she does not drink alcohol  and does not use drugs.  Allergies:  Allergies  Allergen Reactions   Ace Inhibitors Cough and Other (See Comments)    Cough    No medications prior to admission.    Review of Systems  Constitutional:  Negative for fever.    There were no vitals taken for this visit. Physical Exam Cardiovascular:     Rate and Rhythm: Normal rate.  Pulmonary:     Effort: Pulmonary effort is normal.     No results found for this or any previous visit (from the past 24 hours).  No results found.  Assessment/Plan: 73 yo with symptomatic pelvic prolapse D/W LAVH/BSO/A&P/poss SSLS Risks discussed including infection, organ damage, bleeding/transfusion-HIV/Hep, DVT/PE, pneumonia, recurrent prolapse. She states she understands and agrees.  [name] 01/24/2024, 5:38 PM

## 2024-01-25 ENCOUNTER — Observation Stay (HOSPITAL_BASED_OUTPATIENT_CLINIC_OR_DEPARTMENT_OTHER): Payer: No Typology Code available for payment source | Admitting: Anesthesiology

## 2024-01-25 ENCOUNTER — Other Ambulatory Visit: Payer: Self-pay

## 2024-01-25 ENCOUNTER — Encounter (HOSPITAL_COMMUNITY): Payer: Self-pay | Admitting: Obstetrics and Gynecology

## 2024-01-25 ENCOUNTER — Observation Stay (HOSPITAL_COMMUNITY): Payer: Self-pay | Admitting: Anesthesiology

## 2024-01-25 ENCOUNTER — Ambulatory Visit (HOSPITAL_COMMUNITY)
Admission: RE | Admit: 2024-01-25 | Discharge: 2024-01-26 | Disposition: A | Discharge status: Home / Self Care | Payer: No Typology Code available for payment source | Source: Ambulatory Visit | Attending: Obstetrics and Gynecology | Admitting: Obstetrics and Gynecology

## 2024-01-25 ENCOUNTER — Encounter (HOSPITAL_COMMUNITY)
Admission: RE | Disposition: A | Discharge status: Home / Self Care | Payer: Self-pay | Source: Ambulatory Visit | Attending: Obstetrics and Gynecology

## 2024-01-25 DIAGNOSIS — E785 Hyperlipidemia, unspecified: Secondary | ICD-10-CM | POA: Diagnosis not present

## 2024-01-25 DIAGNOSIS — I10 Essential (primary) hypertension: Secondary | ICD-10-CM | POA: Insufficient documentation

## 2024-01-25 DIAGNOSIS — D251 Intramural leiomyoma of uterus: Secondary | ICD-10-CM | POA: Diagnosis not present

## 2024-01-25 DIAGNOSIS — E039 Hypothyroidism, unspecified: Secondary | ICD-10-CM | POA: Insufficient documentation

## 2024-01-25 DIAGNOSIS — K219 Gastro-esophageal reflux disease without esophagitis: Secondary | ICD-10-CM | POA: Diagnosis not present

## 2024-01-25 DIAGNOSIS — N819 Female genital prolapse, unspecified: Secondary | ICD-10-CM | POA: Diagnosis present

## 2024-01-25 DIAGNOSIS — N8111 Cystocele, midline: Secondary | ICD-10-CM

## 2024-01-25 DIAGNOSIS — Z6834 Body mass index (BMI) 34.0-34.9, adult: Secondary | ICD-10-CM | POA: Diagnosis not present

## 2024-01-25 DIAGNOSIS — N72 Inflammatory disease of cervix uteri: Secondary | ICD-10-CM | POA: Insufficient documentation

## 2024-01-25 DIAGNOSIS — F418 Other specified anxiety disorders: Secondary | ICD-10-CM | POA: Insufficient documentation

## 2024-01-25 DIAGNOSIS — Z79899 Other long term (current) drug therapy: Secondary | ICD-10-CM | POA: Diagnosis not present

## 2024-01-25 DIAGNOSIS — N8003 Adenomyosis of the uterus: Secondary | ICD-10-CM | POA: Insufficient documentation

## 2024-01-25 DIAGNOSIS — N8110 Cystocele, unspecified: Secondary | ICD-10-CM | POA: Diagnosis not present

## 2024-01-25 DIAGNOSIS — N8189 Other female genital prolapse: Secondary | ICD-10-CM | POA: Insufficient documentation

## 2024-01-25 DIAGNOSIS — M1990 Unspecified osteoarthritis, unspecified site: Secondary | ICD-10-CM | POA: Diagnosis not present

## 2024-01-25 HISTORY — PX: ANTERIOR AND POSTERIOR REPAIR WITH SACROSPINOUS FIXATION: SHX6536

## 2024-01-25 HISTORY — PX: LAPAROSCOPIC VAGINAL HYSTERECTOMY WITH SALPINGO OOPHORECTOMY: SHX6681

## 2024-01-25 SURGERY — HYSTERECTOMY, VAGINAL, LAPAROSCOPY-ASSISTED, WITH SALPINGO-OOPHORECTOMY
Anesthesia: General | Site: Vagina

## 2024-01-25 MED ORDER — OXYCODONE HCL 5 MG PO TABS: 5.0000 mg | ORAL_TABLET | Freq: Once | ORAL | Status: DC | PRN

## 2024-01-25 MED ORDER — PROPRANOLOL HCL 20 MG PO TABS
20.0000 mg | ORAL_TABLET | Freq: Three times a day (TID) | ORAL | Status: DC
  Administered 2024-01-25 – 2024-01-26 (×3): 20 mg via ORAL
  Filled 2024-01-25 (×5): qty 1

## 2024-01-25 MED ORDER — ONDANSETRON HCL 4 MG/2ML IJ SOLN
INTRAMUSCULAR | Status: AC
  Filled 2024-01-25: qty 2

## 2024-01-25 MED ORDER — FAMOTIDINE 20 MG PO TABS: 40.0000 mg | ORAL_TABLET | Freq: Every evening | ORAL | Status: DC | PRN

## 2024-01-25 MED ORDER — DEXAMETHASONE SODIUM PHOSPHATE 10 MG/ML IJ SOLN
INTRAMUSCULAR | Status: DC | PRN
  Administered 2024-01-25: 5 mg via INTRAVENOUS

## 2024-01-25 MED ORDER — OXYCODONE HCL 5 MG PO TABS
5.0000 mg | ORAL_TABLET | ORAL | Status: DC | PRN
  Administered 2024-01-25 – 2024-01-26 (×3): 10 mg via ORAL
  Filled 2024-01-25 (×3): qty 2

## 2024-01-25 MED ORDER — SOD CITRATE-CITRIC ACID 500-334 MG/5ML PO SOLN
30.0000 mL | ORAL | Status: AC
Start: 2024-01-25 — End: 2024-01-25
  Administered 2024-01-25: 30 mL via ORAL
  Filled 2024-01-25: qty 30

## 2024-01-25 MED ORDER — LACTATED RINGERS IV SOLN: INTRAVENOUS | Status: DC

## 2024-01-25 MED ORDER — HYDROMORPHONE HCL 1 MG/ML IJ SOLN
0.2000 mg | INTRAMUSCULAR | Status: DC | PRN
  Administered 2024-01-25: 0.5 mg via INTRAVENOUS
  Administered 2024-01-26: 0.2 mg via INTRAVENOUS
  Filled 2024-01-25 (×2): qty 1

## 2024-01-25 MED ORDER — SODIUM CHLORIDE (PF) 0.9 % IJ SOLN
INTRAMUSCULAR | Status: AC
  Filled 2024-01-25: qty 100

## 2024-01-25 MED ORDER — HYDROMORPHONE HCL 1 MG/ML IJ SOLN: 0.2500 mg | INTRAMUSCULAR | Status: DC | PRN

## 2024-01-25 MED ORDER — STERILE WATER FOR IRRIGATION IR SOLN
Status: DC | PRN
  Administered 2024-01-25: 1000 mL

## 2024-01-25 MED ORDER — ACETAMINOPHEN 500 MG PO TABS
1000.0000 mg | ORAL_TABLET | Freq: Once | ORAL | Status: AC
  Administered 2024-01-25: 1000 mg via ORAL

## 2024-01-25 MED ORDER — ROCURONIUM BROMIDE 10 MG/ML (PF) SYRINGE
PREFILLED_SYRINGE | INTRAVENOUS | Status: DC | PRN
  Administered 2024-01-25: 30 mg via INTRAVENOUS

## 2024-01-25 MED ORDER — ACETAMINOPHEN 500 MG PO TABS
1000.0000 mg | ORAL_TABLET | ORAL | Status: DC
  Filled 2024-01-25: qty 2

## 2024-01-25 MED ORDER — ESTRADIOL 0.1 MG/GM VA CREA
TOPICAL_CREAM | VAGINAL | Status: DC | PRN
  Administered 2024-01-25: 1 via VAGINAL

## 2024-01-25 MED ORDER — MIDAZOLAM HCL 2 MG/2ML IJ SOLN: 0.5000 mg | Freq: Once | INTRAMUSCULAR | Status: DC | PRN

## 2024-01-25 MED ORDER — MAGNESIUM HYDROXIDE 400 MG/5ML PO SUSP: 30.0000 mL | Freq: Every day | ORAL | Status: DC | PRN

## 2024-01-25 MED ORDER — GABAPENTIN 300 MG PO CAPS: 600.0000 mg | ORAL_CAPSULE | Freq: Every day | ORAL | Status: DC

## 2024-01-25 MED ORDER — OXYCODONE HCL 5 MG/5ML PO SOLN: 5.0000 mg | Freq: Once | ORAL | Status: DC | PRN

## 2024-01-25 MED ORDER — EPHEDRINE 5 MG/ML INJ
INTRAVENOUS | Status: AC
  Filled 2024-01-25: qty 5

## 2024-01-25 MED ORDER — IBUPROFEN 600 MG PO TABS: 600.0000 mg | ORAL_TABLET | Freq: Four times a day (QID) | ORAL | Status: DC | PRN

## 2024-01-25 MED ORDER — LIDOCAINE 2% (20 MG/ML) 5 ML SYRINGE
INTRAMUSCULAR | Status: AC
  Filled 2024-01-25: qty 5

## 2024-01-25 MED ORDER — TRAZODONE HCL 100 MG PO TABS
100.0000 mg | ORAL_TABLET | Freq: Once | ORAL | Status: AC
  Administered 2024-01-25: 100 mg via ORAL
  Filled 2024-01-25: qty 1

## 2024-01-25 MED ORDER — METHOCARBAMOL 500 MG PO TABS
500.0000 mg | ORAL_TABLET | Freq: Four times a day (QID) | ORAL | Status: DC | PRN
  Filled 2024-01-25: qty 1

## 2024-01-25 MED ORDER — SENNA 8.6 MG PO TABS
1.0000 | ORAL_TABLET | Freq: Two times a day (BID) | ORAL | Status: DC
  Administered 2024-01-25 – 2024-01-26 (×2): 8.6 mg via ORAL
  Filled 2024-01-25 (×3): qty 1

## 2024-01-25 MED ORDER — SODIUM CHLORIDE (PF) 0.9 % IJ SOLN
INTRAMUSCULAR | Status: DC | PRN
  Administered 2024-01-25: 10 mL

## 2024-01-25 MED ORDER — CEFAZOLIN SODIUM-DEXTROSE 2-4 GM/100ML-% IV SOLN
2.0000 g | INTRAVENOUS | Status: AC
  Administered 2024-01-25: 2 g via INTRAVENOUS
  Filled 2024-01-25: qty 100

## 2024-01-25 MED ORDER — DEXAMETHASONE SODIUM PHOSPHATE 10 MG/ML IJ SOLN
INTRAMUSCULAR | Status: AC
  Filled 2024-01-25: qty 1

## 2024-01-25 MED ORDER — ACETAMINOPHEN 500 MG PO TABS
1000.0000 mg | ORAL_TABLET | Freq: Four times a day (QID) | ORAL | Status: DC
  Administered 2024-01-25 – 2024-01-26 (×5): 1000 mg via ORAL
  Filled 2024-01-25 (×5): qty 2

## 2024-01-25 MED ORDER — LIDOCAINE-EPINEPHRINE 1 %-1:100000 IJ SOLN
INTRAMUSCULAR | Status: AC
  Filled 2024-01-25: qty 2

## 2024-01-25 MED ORDER — SUCCINYLCHOLINE CHLORIDE 200 MG/10ML IV SOSY
PREFILLED_SYRINGE | INTRAVENOUS | Status: AC
  Filled 2024-01-25: qty 10

## 2024-01-25 MED ORDER — MEPERIDINE HCL 25 MG/ML IJ SOLN: 6.2500 mg | INTRAMUSCULAR | Status: DC | PRN

## 2024-01-25 MED ORDER — PROPOFOL 10 MG/ML IV BOLUS
INTRAVENOUS | Status: DC | PRN
  Administered 2024-01-25: 200 mg via INTRAVENOUS
  Administered 2024-01-25: 25 ug/kg/min via INTRAVENOUS

## 2024-01-25 MED ORDER — BUPIVACAINE HCL (PF) 0.5 % IJ SOLN
INTRAMUSCULAR | Status: AC
  Filled 2024-01-25: qty 30

## 2024-01-25 MED ORDER — SUCCINYLCHOLINE CHLORIDE 200 MG/10ML IV SOSY
PREFILLED_SYRINGE | INTRAVENOUS | Status: DC | PRN
  Administered 2024-01-25: 100 mg via INTRAVENOUS

## 2024-01-25 MED ORDER — CELECOXIB 200 MG PO CAPS
400.0000 mg | ORAL_CAPSULE | ORAL | Status: AC
  Administered 2024-01-25: 400 mg via ORAL
  Filled 2024-01-25: qty 2

## 2024-01-25 MED ORDER — SUGAMMADEX SODIUM 200 MG/2ML IV SOLN
INTRAVENOUS | Status: DC | PRN
  Administered 2024-01-25: 200 mg via INTRAVENOUS

## 2024-01-25 MED ORDER — CHLORHEXIDINE GLUCONATE 0.12 % MT SOLN: 15.0000 mL | Freq: Once | OROMUCOSAL | Status: DC

## 2024-01-25 MED ORDER — FENTANYL CITRATE (PF) 250 MCG/5ML IJ SOLN
INTRAMUSCULAR | Status: AC
  Filled 2024-01-25: qty 5

## 2024-01-25 MED ORDER — LEVOTHYROXINE SODIUM 25 MCG PO TABS
50.0000 ug | ORAL_TABLET | Freq: Every day | ORAL | Status: DC
  Administered 2024-01-26: 50 ug via ORAL
  Filled 2024-01-25: qty 2

## 2024-01-25 MED ORDER — LOSARTAN POTASSIUM 25 MG PO TABS
50.0000 mg | ORAL_TABLET | Freq: Every evening | ORAL | Status: DC
  Administered 2024-01-25: 50 mg via ORAL
  Filled 2024-01-25: qty 2

## 2024-01-25 MED ORDER — EPHEDRINE SULFATE-NACL 50-0.9 MG/10ML-% IV SOSY
PREFILLED_SYRINGE | INTRAVENOUS | Status: DC | PRN
Start: 2024-01-25 — End: 2024-01-25
  Administered 2024-01-25: 5 mg via INTRAVENOUS

## 2024-01-25 MED ORDER — HEMOSTATIC AGENTS (NO CHARGE) OPTIME
TOPICAL | Status: DC | PRN
  Administered 2024-01-25: 1

## 2024-01-25 MED ORDER — LIDOCAINE 2% (20 MG/ML) 5 ML SYRINGE
INTRAMUSCULAR | Status: DC | PRN
  Administered 2024-01-25: 50 mg via INTRAVENOUS

## 2024-01-25 MED ORDER — ORAL CARE MOUTH RINSE: 15.0000 mL | Freq: Once | OROMUCOSAL | Status: AC

## 2024-01-25 MED ORDER — ONDANSETRON HCL 4 MG/2ML IJ SOLN
INTRAMUSCULAR | Status: DC | PRN
  Administered 2024-01-25: 4 mg via INTRAVENOUS

## 2024-01-25 MED ORDER — ORAL CARE MOUTH RINSE: 15.0000 mL | Freq: Once | OROMUCOSAL | Status: DC

## 2024-01-25 MED ORDER — FENTANYL CITRATE (PF) 250 MCG/5ML IJ SOLN
INTRAMUSCULAR | Status: DC | PRN
  Administered 2024-01-25: 100 ug via INTRAVENOUS
  Administered 2024-01-25: 50 ug via INTRAVENOUS

## 2024-01-25 MED ORDER — LACTATED RINGERS IV SOLN: INTRAVENOUS | Status: AC

## 2024-01-25 MED ORDER — ESTRADIOL 0.1 MG/GM VA CREA
TOPICAL_CREAM | VAGINAL | Status: AC
  Filled 2024-01-25: qty 42.5

## 2024-01-25 MED ORDER — ONDANSETRON HCL 4 MG/2ML IJ SOLN: 4.0000 mg | Freq: Four times a day (QID) | INTRAMUSCULAR | Status: DC | PRN

## 2024-01-25 MED ORDER — BUPIVACAINE HCL (PF) 0.5 % IJ SOLN
INTRAMUSCULAR | Status: DC | PRN
  Administered 2024-01-25: 30 mL

## 2024-01-25 MED ORDER — CHLORHEXIDINE GLUCONATE 0.12 % MT SOLN
15.0000 mL | Freq: Once | OROMUCOSAL | Status: AC
  Administered 2024-01-25: 15 mL via OROMUCOSAL
  Filled 2024-01-25: qty 15

## 2024-01-25 MED ORDER — SODIUM CHLORIDE 0.9 % IR SOLN
Status: DC | PRN
  Administered 2024-01-25: 1000 mL

## 2024-01-25 MED ORDER — DULOXETINE HCL 20 MG PO CPEP
60.0000 mg | ORAL_CAPSULE | Freq: Every day | ORAL | Status: DC
  Administered 2024-01-26: 60 mg via ORAL
  Filled 2024-01-25: qty 3

## 2024-01-25 MED ORDER — PHENYLEPHRINE 80 MCG/ML (10ML) SYRINGE FOR IV PUSH (FOR BLOOD PRESSURE SUPPORT)
PREFILLED_SYRINGE | INTRAVENOUS | Status: AC
  Filled 2024-01-25: qty 10

## 2024-01-25 MED ORDER — ROCURONIUM BROMIDE 10 MG/ML (PF) SYRINGE
PREFILLED_SYRINGE | INTRAVENOUS | Status: AC
  Filled 2024-01-25: qty 10

## 2024-01-25 MED ORDER — ONDANSETRON HCL 4 MG PO TABS: 4.0000 mg | ORAL_TABLET | Freq: Four times a day (QID) | ORAL | Status: DC | PRN

## 2024-01-25 MED ORDER — POVIDONE-IODINE 10 % EX SWAB
2.0000 | Freq: Once | CUTANEOUS | Status: AC
Start: 2024-01-25 — End: 2024-01-25
  Administered 2024-01-25: 2 via TOPICAL

## 2024-01-25 SURGICAL SUPPLY — 56 items
APPLICATOR ARISTA FLEXITIP XL (MISCELLANEOUS) IMPLANT
BLADE SURG 15 STRL LF DISP TIS (BLADE) IMPLANT
CABLE HIGH FREQUENCY MONO STRZ (ELECTRODE) IMPLANT
CATH ROBINSON RED A/P 16FR (CATHETERS) ×2 IMPLANT
COVER BACK TABLE 60X90IN (DRAPES) ×2 IMPLANT
COVER MAYO STAND STRL (DRAPES) ×2 IMPLANT
DERMABOND ADVANCED .7 DNX12 (GAUZE/BANDAGES/DRESSINGS) ×2 IMPLANT
DEVICE CAPIO SLIM SINGLE (INSTRUMENTS) IMPLANT
DRSG OPSITE POSTOP 3X4 (GAUZE/BANDAGES/DRESSINGS) ×2 IMPLANT
DURAPREP 26ML APPLICATOR (WOUND CARE) ×2 IMPLANT
ELECT REM PT RETURN 9FT ADLT (ELECTROSURGICAL)
ELECTRODE REM PT RTRN 9FT ADLT (ELECTROSURGICAL) IMPLANT
FILTER SMOKE EVAC LAPAROSHD (FILTER) ×2 IMPLANT
GAUZE PACKING 1/2INX5YD STRL (GAUZE/BANDAGES/DRESSINGS) IMPLANT
GLOVE BIO SURGEON STRL SZ8 (GLOVE) ×4 IMPLANT
GLOVE BIOGEL PI IND STRL 6.5 (GLOVE) ×2 IMPLANT
GLOVE BIOGEL PI IND STRL 8 (GLOVE) ×2 IMPLANT
GLOVE SURG UNDER POLY LF SZ7 (GLOVE) ×4 IMPLANT
GOWN STRL REUS W/ TWL XL LVL3 (GOWN DISPOSABLE) ×4 IMPLANT
HEMOSTAT ARISTA ABSORB 3G PWDR (HEMOSTASIS) IMPLANT
IRRIG SUCT STRYKERFLOW 2 WTIP (MISCELLANEOUS) ×2
IRRIGATION SUCT STRKRFLW 2 WTP (MISCELLANEOUS) IMPLANT
KIT PINK PAD W/HEAD ARE REST (MISCELLANEOUS) ×2
KIT PINK PAD W/HEAD ARM REST (MISCELLANEOUS) ×2 IMPLANT
KIT TURNOVER KIT B (KITS) ×2 IMPLANT
LEGGING LITHOTOMY PAIR STRL (DRAPES) ×2 IMPLANT
LIGASURE IMPACT 36 18CM CVD LR (INSTRUMENTS) IMPLANT
NDL HYPO 22X1.5 SAFETY MO (MISCELLANEOUS) IMPLANT
NDL INSUFFLATION 14GA 120MM (NEEDLE) ×2 IMPLANT
NEEDLE HYPO 22X1.5 SAFETY MO (MISCELLANEOUS) ×2
NEEDLE INSUFFLATION 14GA 120MM (NEEDLE) ×2
NS IRRIG 1000ML POUR BTL (IV SOLUTION) ×2 IMPLANT
PACK LAVH (CUSTOM PROCEDURE TRAY) ×2 IMPLANT
PROTECTOR NERVE ULNAR (MISCELLANEOUS) ×4 IMPLANT
SCISSORS LAP 5X45 EPIX DISP (ENDOMECHANICALS) IMPLANT
SEALER TISSUE G2 CVD JAW 45CM (ENDOMECHANICALS) ×2 IMPLANT
SET CYSTO W/LG BORE CLAMP LF (SET/KITS/TRAYS/PACK) IMPLANT
SET TUBE SMOKE EVAC HIGH FLOW (TUBING) ×2 IMPLANT
SLEEVE Z-THREAD 5X100MM (TROCAR) IMPLANT
SPECIMEN JAR MEDIUM (MISCELLANEOUS) ×2 IMPLANT
SPIKE FLUID TRANSFER (MISCELLANEOUS) ×2 IMPLANT
SUT CAPIO POLYGLYCOLIC (SUTURE) IMPLANT
SUT MNCRL 0 MO-4 VIOLET 18 CR (SUTURE) ×2 IMPLANT
SUT MNCRL 0 VIOLET 6X18 (SUTURE) ×2 IMPLANT
SUT VIC AB 2-0 CT2 27 (SUTURE) IMPLANT
SUT VIC AB 2-0 UR6 27 (SUTURE) IMPLANT
SUT VICRYL 0 UR6 27IN ABS (SUTURE) ×2 IMPLANT
SUT VICRYL 4-0 PS2 18IN ABS (SUTURE) ×2 IMPLANT
SYR 30ML LL (SYRINGE) ×2 IMPLANT
TOWEL GREEN STERILE FF (TOWEL DISPOSABLE) ×4 IMPLANT
TRAY FOLEY W/BAG SLVR 14FR (SET/KITS/TRAYS/PACK) ×2 IMPLANT
TROCAR 11X100 Z THREAD (TROCAR) ×2 IMPLANT
TROCAR XCEL NON-BLD 5MMX100MML (ENDOMECHANICALS) ×2 IMPLANT
UNDERPAD 30X36 HEAVY ABSORB (UNDERPADS AND DIAPERS) ×2 IMPLANT
WARMER LAPAROSCOPE (MISCELLANEOUS) ×2 IMPLANT
WATER STERILE IRR 1000ML POUR (IV SOLUTION) IMPLANT

## 2024-01-25 NOTE — Anesthesia Procedure Notes (Signed)
Procedure Name: Intubation Date/Time: 01/25/2024 7:35 AM  Performed by: Bartholomew Crews, CRNAPre-anesthesia Checklist: Patient identified, Emergency Drugs available, Suction available, Patient being monitored and Timeout performed Patient Re-evaluated:Patient Re-evaluated prior to induction Oxygen Delivery Method: Circle system utilized Preoxygenation: Pre-oxygenation with 100% oxygen Induction Type: IV induction Ventilation: Mask ventilation without difficulty Laryngoscope Size: Mac and 4 Grade View: Grade II Tube type: Oral Tube size: 6.5 mm Airway Equipment and Method: Stylet Placement Confirmation: ETT inserted through vocal cords under direct vision, breath sounds checked- equal and bilateral and positive ETCO2 Secured at: 22 cm Tube secured with: Tape Dental Injury: Teeth and Oropharynx as per pre-operative assessment

## 2024-01-25 NOTE — Transfer of Care (Signed)
Immediate Anesthesia Transfer of Care Note  Patient: Vanessa [name]  Procedure(s) Performed: LAPAROSCOPIC ASSISTED VAGINAL HYSTERECTOMY WITH SALPINGO OOPHORECTOMY (Abdomen) ANTERIOR AND POSTERIOR REPAIR WITH SACROSPINOUS FIXATION (Vagina )  Patient Location: PACU  Anesthesia Type:General  Level of Consciousness: awake and alert   Airway & Oxygen Therapy: Patient Spontanous Breathing  Post-op Assessment: Report given to RN  Post vital signs: Reviewed and stable  Last Vitals:  Vitals Value Taken Time  BP 126/78 01/25/24 1020  Temp    Pulse 71 01/25/24 1024  Resp 16 01/25/24 1024  SpO2 92 % 01/25/24 1024  Vitals shown include unfiled device data.  Last Pain:  Vitals:   01/25/24 0631  PainSc: 0-No pain      Patients Stated Pain Goal: 0 (01/25/24 0631)  Complications: No notable events documented.

## 2024-01-25 NOTE — Progress Notes (Signed)
No change to H&P per patient history Reviewed procedure-LAVH/BSO/A&P/SSLS.  All questions answered. She states she understands and agrees Allergies : ACE inhibitors>cough.

## 2024-01-25 NOTE — Op Note (Signed)
NAME: Vanessa [name], Vanessa [name] MEDICAL RECORD NO: [id number] ACCOUNT NO: [id number] DATE OF BIRTH: 15-Aug-1951 FACILITY: [facility] LOCATION: [facility] PHYSICIAN: Guy Sandifer. Arleta Creek, MD  Operative Report   DATE OF PROCEDURE: 01/25/2024  PREOPERATIVE DIAGNOSIS: Pelvic prolapse.  POSTOPERATIVE DIAGNOSIS: Pelvic prolapse.  PROCEDURE: Laparoscopically assisted vaginal hysterectomy with bilateral salpingo-oophorectomy and anterior and posterior vaginal repair.  SURGEON: Harold Hedge, MD  ASSISTANT: Richardean Chimera, MD  ANESTHESIA: General endotracheal intubation. Dr. Jean Rosenthal.  ESTIMATED BLOOD LOSS: 125 mL.  SPECIMENS: Uterus, bilateral fallopian tubes, and ovaries to pathology.  INDICATIONS AND CONSENT: This patient is a 73 year old patient with symptomatic prolapse. Laparoscopically assisted vaginal hysterectomy with bilateral salpingo-oophorectomy, anterior-posterior repair, and possible sacrospinous ligament suspension has been discussed  preoperatively. Potential risks and complications were discussed preoperatively including but not limited to infection, organ damage, bleeding requiring transfusion of blood products with HIV and hepatitis acquisition, DVT, PE, pneumonia, recurrent  prolapse, and pelvic vulvar pain. The patient states she understands and agrees and consent is signed on the chart.  FINDINGS: Laparoscopically, the upper abdomen is grossly normal. In the pelvis, the fallopian tubes, ovaries, anterior, posterior cul-de-sac, and uterus are normal. On pelvic exam, the cystocele presents at the vaginal introitus.  DESCRIPTION OF PROCEDURE: The patient was taken to the operating room where she was identified, placed in the dorsal supine position and general anesthesia was induced via endotracheal intubation. She was then placed in the dorsal lithotomy position. She was prepped vaginally  with Hibiclens. Bladder was straight catheterized and a Hulka tenaculum was placed in the  uterus as a manipulator. She was prepped abdominally with DuraPrep. Timeout was done. After a 3-minute drying time, she was draped in a sterile fashion. The  infraumbilical and suprapubic areas were injected in the midline with approximately 10 mL of 0.5% plain Marcaine. A small infraumbilical incision was made. Disposable Veress needle was placed on the first attempt without difficulty. A good syringe and  drop test were noted. 2 liters of gas were insufflated under low pressure with good tympany in the right upper quadrant. Veress needle was removed and a 10-11 XL bladeless disposable trocar sleeve was placed using direct visualization with the diagnostic  scope. After placement, the operative scope was used. A small suprapubic incision was made in the midline and a 5 mm disposable trocar sleeve was placed under direct visualization without difficulty. After noting the above findings, the EnSeal bipolar  cautery cutting instrument was used to take down the right infundibulopelvic ligament coming across to the proximal ligaments, coming across the round ligament down to the level of the vesicouterine peritoneum. Similar procedure was carried out on the  left. Vesicouterine peritoneum was taken down bilaterally. Instruments and suprapubic trocar sleeve were removed and attention was turned to the vagina. The posterior cul-de-sac was entered sharply. Cervix was circumscribed with unipolar cautery and the  mucosa was advanced sharply and bluntly. Progressive bites of the bladder pillars, cardinal ligaments, and uterine vessels were taken bilaterally with the handheld LigaSure bipolar cautery cutting instrument. This allows delivery of the uterus  posteriorly and the remaining attachments on the lower anterior segment were taken down with the LigaSure without difficulty completely removing the uterus with bilateral fallopian tubes and ovaries. All sutures will be 0 Monocryl as otherwise  designated. Uterosacral  ligaments were plicated to the cuff bilaterally and then plicating the cuff in the midline. Posterior half of the vaginal cuff is then closed with interrupted suture. The anterior vaginal repair was then done. The anterior  vaginal  mucosa was injected submucosally with a 50:50 solution of 1% lidocaine with epinephrine and saline. The anterior mucosa was then taken down in the midline up to 1 to 2 cm below the urethral meatus. Mucosa was then widely dissected from the  underlying bladder sharply and bluntly. The cystocele was reduced first with pursestring suture and then with figure-of-eights reducing the cystocele. Excess mucosa was trimmed and the anterior mucosa was closed in a running locking fashion with a 2-0  Vicryl. The anterior portion of the cuff was also closed with 0 Monocryl suture. Next, attention was turned posteriorly. A very shallow wedge shape of tissue was resected from the posterior perineal body. The posterior vaginal mucosa was infiltrated  submucosally with the same 50/50 solution. Posterior mucosa was taken down in the midline, two-thirds of the length of the posterior vagina. The underlying rectum was dissected from the mucosa sharply and bluntly. The sacrospinous ligament was easily  identified. Then using the Capio needle driver, repeated attempts are made to pass the needle, which will not successfully pass through tissue. Therefore, another Capio with a new needle was obtained and again the needle will not pass to the other side  of the needle driver. Therefore, the sacrospinous suspension is not done. The rectovaginal fascia was reapproximated in the midline with interrupted 0 Vicryl sutures. This gives excellent support to the posterior vagina and the cuff has good support as  well. The posterior vaginal mucosa was closed in a running locking fashion with 2-0 Vicryl suture. This was closed on the perineum in episiotomy type fashion. Foley catheter was placed and clear urine was  noted. Quarter-inch vaginal packing with estrogen  cream was placed in the vagina. Attention was returned to the abdomen. Pneumoperitoneum was recreated and the suprapubic trocar sleeves were placed under direct visualization. Very brief bipolar cautery was used to assure complete hemostasis.   Fluid was irrigated and removed with the suction irrigator. All returns was clear. The remaining approximately 22 mL of 0.5% plain Marcaine was instilled into the peritoneal cavity. Arista was then placed over the vaginal cuff. Suprapubic trocar sleeve  was removed. Pneumoperitoneum was reduced and the umbilical trocar sleeve was removed. The umbilical incision was closed with interrupted 2-0 Vicryl. Suprapubic incision was closed as well. Dermabond was placed on both. All counts were correct. The  patient was awakened, taken to the recovery room in stable condition.    [email or web address] D: 01/25/2024 10:28:09 am T: 01/25/2024 10:52:00 am  JOB: [id number]/ [id number]

## 2024-01-25 NOTE — Progress Notes (Signed)
01/25/2024  10:16 AM  PATIENT:  Vanessa [name]  73 y.o. female  PRE-OPERATIVE DIAGNOSIS:  PROLAPSE  POST-OPERATIVE DIAGNOSIS:  PROLAPSE  PROCEDURE:  Laparoscopically assisted vaginal hysterectomy with bilateral salpingo oophorectomy, anterior and posterior vaginal repair  SURGEON:  Surgeons and Role:    * Harold Hedge, MD - Primary    Richardean Chimera, MD - Assisting  PHYSICIAN ASSISTANT:   ASSISTANTS:   ANESTHESIA:   general  EBL:  125 mL   BLOOD ADMINISTERED:none  DRAINS: Urinary Catheter (Foley)   LOCAL MEDICATIONS USED:  MARCAINE  0.5% 20 ml,   and XYLOCAINE 1% with epi diluted 50:50 with saline 10 ml   SPECIMEN:  Source of Specimen:  uterus, bilateral fallopian tubes/ovaries  DISPOSITION OF SPECIMEN:  PATHOLOGY  COUNTS:  YES  TOURNIQUET:  * No tourniquets in log *  DICTATION: .Other Dictation: Dictation Number [id number]  PLAN OF CARE: Admit for overnight observation  PATIENT DISPOSITION:  PACU - hemodynamically stable.   Delay start of Pharmacological VTE agent (>24hrs) due to surgical blood loss or risk of bleeding: not applicable

## 2024-01-25 NOTE — Progress Notes (Signed)
Drinking water, eating ice, ate soup/crackers, passing a lot of flatus. Had some bilat low abdominal post op pain now mainly in RLQ. Not up on feet yet  Today's Vitals   01/25/24 1235 01/25/24 1330 01/25/24 1435 01/25/24 1540  BP:    112/73  Pulse:    72  Resp:    17  Temp:    97.6 F (36.4 C)  TempSrc:    Oral  SpO2: 95% 98%  95%  Weight:      Height:      PainSc:   8  3    Body mass index is 34.22 kg/m.  UO clear and dilute  Smiling, NAD Abdomen soft, excellent BS, incisions OK LE PAS on  A/P: D/W surgery         D/C gabapentin (for restless leg)         Methocarbamol prn pain relief         D/C vaginal pack and foley 5:00 am         Up on feet this pm

## 2024-01-25 NOTE — Anesthesia Postprocedure Evaluation (Signed)
Anesthesia Post Note  Patient: Vanessa [name]  Procedure(s) Performed: LAPAROSCOPIC ASSISTED VAGINAL HYSTERECTOMY WITH SALPINGO OOPHORECTOMY (Abdomen) ANTERIOR AND POSTERIOR REPAIR WITH SACROSPINOUS FIXATION (Vagina )     Patient location during evaluation: PACU Anesthesia Type: General Level of consciousness: awake and alert, patient cooperative and oriented Pain control: treating with pain meds. Vital Signs Assessment: post-procedure vital signs reviewed and stable Respiratory status: spontaneous breathing, nonlabored ventilation and respiratory function stable Cardiovascular status: blood pressure returned to baseline and stable Postop Assessment: no apparent nausea or vomiting Anesthetic complications: no   No notable events documented.  Last Vitals:  Vitals:   01/25/24 1113 01/25/24 1139  BP:  (!) 152/87  Pulse: 66 67  Resp: 17 19  Temp: 36.7 C 36.9 C  SpO2: 94% 96%    Last Pain:  Vitals:   01/25/24 1145  TempSrc:   PainSc: 10-Worst pain ever                 Nhia Heaphy,E. Phoebie Shad

## 2024-01-26 ENCOUNTER — Encounter (HOSPITAL_COMMUNITY): Payer: Self-pay | Admitting: Obstetrics and Gynecology

## 2024-01-26 DIAGNOSIS — N8189 Other female genital prolapse: Secondary | ICD-10-CM | POA: Diagnosis not present

## 2024-01-26 LAB — CBC
HCT: 38 % (ref 36.0–46.0)
Hemoglobin: 12.7 g/dL (ref 12.0–15.0)
MCH: 31.4 pg (ref 26.0–34.0)
MCHC: 33.4 g/dL (ref 30.0–36.0)
MCV: 93.8 fL (ref 80.0–100.0)
Platelets: 130 10*3/uL — ABNORMAL LOW (ref 150–400)
RBC: 4.05 MIL/uL (ref 3.87–5.11)
RDW: 12.7 % (ref 11.5–15.5)
WBC: 6.8 10*3/uL (ref 4.0–10.5)
nRBC: 0 % (ref 0.0–0.2)

## 2024-01-26 LAB — SURGICAL PATHOLOGY

## 2024-01-26 MED ORDER — METHOCARBAMOL 500 MG PO TABS: 500.0000 mg | ORAL_TABLET | Freq: Four times a day (QID) | ORAL | 0 refills | Status: AC | PRN

## 2024-01-26 MED ORDER — OXYCODONE HCL 5 MG PO TABS: 5.0000 mg | ORAL_TABLET | Freq: Four times a day (QID) | ORAL | 0 refills | Status: AC | PRN

## 2024-01-26 MED ORDER — IBUPROFEN 600 MG PO TABS: 600.0000 mg | ORAL_TABLET | Freq: Four times a day (QID) | ORAL | 0 refills | Status: AC | PRN

## 2024-01-26 NOTE — Discharge Summary (Signed)
Physician Discharge Summary  Patient ID: Vanessa [name] MRN: [id number] DOB/AGE: 05/24/1951 73 y.o.  Admit date: 01/25/2024 Discharge date: 01/26/2024  Admission Diagnoses: Pelvic Prolapse  Discharge Diagnoses:  Principal Problem:   Pelvic prolapse   Discharged Condition: good  Hospital Course: Had LAVH/BSO/A&P repair. Postoperatively had good resumption of bowel and bladder function. She is ambulating well, tolerating regular diet with good pain relief  Consults: None  Significant Diagnostic Studies: labs:  Results for orders placed or performed during the hospital encounter of 01/25/24 (from the past 24 hours)  CBC     Status: Abnormal   Collection Time: 01/26/24  4:48 AM  Result Value Ref Range   WBC 6.8 4.0 - 10.5 K/uL   RBC 4.05 3.87 - 5.11 MIL/uL   Hemoglobin 12.7 12.0 - 15.0 g/dL   HCT 40.9 81.1 - 91.4 %   MCV 93.8 80.0 - 100.0 fL   MCH 31.4 26.0 - 34.0 pg   MCHC 33.4 30.0 - 36.0 g/dL   RDW 78.2 95.6 - 21.3 %   Platelets 130 (L) 150 - 400 K/uL   nRBC 0.0 0.0 - 0.2 %     Treatments: surgery: LAVH/BSO/A&P repair  Discharge Exam: Blood pressure (!) 98/52, pulse 67, temperature 97.7 F (36.5 C), temperature source Oral, resp. rate 16, height 5\' 6"  (1.676 m), weight 96.2 kg, SpO2 93%. General appearance: alert, cooperative, and no distress GI: soft with good BS, incisions healing well  Disposition: Discharge disposition: 01-Home or Self Care        Allergies as of 01/26/2024       Reactions   Ace Inhibitors Cough, Other (See Comments)   Cough        Medication List     STOP taking these medications    estradiol 0.1 MG/GM vaginal cream Commonly known as: ESTRACE   gabapentin 300 MG capsule Commonly known as: NEURONTIN   gabapentin 600 MG tablet Commonly known as: NEURONTIN   meloxicam 7.5 MG tablet Commonly known as: MOBIC       TAKE these medications    acetaminophen 500 MG tablet Commonly known as: TYLENOL Take 500-1,000 mg by  mouth every 6 (six) hours as needed (pain.).   alendronate 70 MG tablet Commonly known as: FOSAMAX Take 70 mg by mouth every Monday. Take with a full glass of water on an empty stomach.   Anti-Diarrheal 2 MG tablet Generic drug: loperamide Take 2-4 mg by mouth every other day.   atorvastatin 20 MG tablet Commonly known as: LIPITOR Take 20 mg by mouth at bedtime.   DULoxetine 60 MG capsule Commonly known as: CYMBALTA Take 1 capsule (60 mg total) by mouth daily.   famotidine 40 MG tablet Commonly known as: PEPCID Take 40 mg by mouth at bedtime as needed for indigestion or heartburn.   ibuprofen 600 MG tablet Commonly known as: ADVIL Take 1 tablet (600 mg total) by mouth every 6 (six) hours as needed for cramping.   K2-D3 MAX PO Take 1 tablet by mouth in the morning. K2 (MK7) with D3 Supplement 5000 IU Vitamin D3 & 90 mcg K2 MK-7   levothyroxine 50 MCG tablet Commonly known as: SYNTHROID Take 50 mcg by mouth daily before breakfast.   losartan 50 MG tablet Commonly known as: COZAAR Take 50 mg by mouth every evening.   melatonin 5 MG Tabs Take 10 mg by mouth at bedtime.   methocarbamol 500 MG tablet Commonly known as: ROBAXIN Take 1 tablet (500 mg total) by  mouth every 6 (six) hours as needed for muscle spasms.   oxyCODONE 5 MG immediate release tablet Commonly known as: Oxy IR/ROXICODONE Take 1 tablet (5 mg total) by mouth every 6 (six) hours as needed for moderate pain (pain score 4-6).   propranolol 20 MG tablet Commonly known as: INDERAL Take 1 tablet (20 mg total) by mouth 3 (three) times daily.   Refresh Tears 0.5 % Soln Generic drug: carboxymethylcellulose Place 1 drop into both eyes 2 (two) times daily as needed (irritated/dry eyes.).   traZODone 100 MG tablet Commonly known as: DESYREL TAKE 1 TABLET BY MOUTH AT BEDTIME         Signed: Roselle Locus II 01/26/2024, 1:34 PM

## 2024-01-26 NOTE — Progress Notes (Signed)
Voiding, passing flatus, tolerating regular diet, ambulating well Wants to go home  Today's Vitals   01/26/24 0500 01/26/24 0650 01/26/24 0859 01/26/24 0900  BP:   (!) 98/52   Pulse:      Resp:      Temp:   97.7 F (36.5 C)   TempSrc:   Oral   SpO2:   93%   Weight:      Height:      PainSc: 9  2   2     Body mass index is 34.22 kg/m.   Abdomen soft  Results for orders placed or performed during the hospital encounter of 01/25/24 (from the past 24 hours)  CBC     Status: Abnormal   Collection Time: 01/26/24  4:48 AM  Result Value Ref Range   WBC 6.8 4.0 - 10.5 K/uL   RBC 4.05 3.87 - 5.11 MIL/uL   Hemoglobin 12.7 12.0 - 15.0 g/dL   HCT 27.2 53.6 - 64.4 %   MCV 93.8 80.0 - 100.0 fL   MCH 31.4 26.0 - 34.0 pg   MCHC 33.4 30.0 - 36.0 g/dL   RDW 03.4 74.2 - 59.5 %   Platelets 130 (L) 150 - 400 K/uL   nRBC 0.0 0.0 - 0.2 %     A/P: D/C home          Instructions reviewed         Methocarbamol/oxycodone         FU office 2 weeks

## 2024-01-26 NOTE — Progress Notes (Signed)
Vaginal packing removed at 0500 with foley catheter.

## 2024-04-02 ENCOUNTER — Other Ambulatory Visit: Payer: Self-pay | Admitting: Physical Medicine & Rehabilitation

## 2024-04-02 DIAGNOSIS — G4709 Other insomnia: Secondary | ICD-10-CM

## 2024-05-24 ENCOUNTER — Ambulatory Visit: Payer: Medicare Other | Admitting: Physical Medicine & Rehabilitation

## 2024-06-30 ENCOUNTER — Telehealth: Payer: Self-pay

## 2024-06-30 DIAGNOSIS — G2581 Restless legs syndrome: Secondary | ICD-10-CM

## 2024-06-30 DIAGNOSIS — G4709 Other insomnia: Secondary | ICD-10-CM

## 2024-06-30 NOTE — Telephone Encounter (Addendum)
 Vanessa [name] has requested a refill of :  Gabapentin   600 mg tab,  Trazodone  Hydrochloride 100 MG  & Propranolol  Hydrochloride 20 MG tab.   If granted please send to [facility].   She stated will call back for an appointment.  Thank you.

## 2024-07-03 MED ORDER — PROPRANOLOL HCL 20 MG PO TABS: 20.0000 mg | ORAL_TABLET | Freq: Three times a day (TID) | ORAL | 3 refills | Status: AC

## 2024-07-03 MED ORDER — TRAZODONE HCL 100 MG PO TABS
100.0000 mg | ORAL_TABLET | Freq: Every day | ORAL | 9 refills | Status: AC
Start: 2024-07-03 — End: ?

## 2024-07-03 MED ORDER — GABAPENTIN 600 MG PO TABS: 600.0000 mg | ORAL_TABLET | Freq: Every day | ORAL | 3 refills | Status: DC

## 2024-07-03 NOTE — Telephone Encounter (Signed)
 Rx's sent in. Someone deleted her gabapentin  rx, but I added it back in.

## 2024-07-07 NOTE — Telephone Encounter (Signed)
 Patient needs an appointment.  Thank you.

## 2024-11-29 ENCOUNTER — Encounter: Payer: Self-pay | Admitting: Physical Medicine & Rehabilitation

## 2024-11-29 ENCOUNTER — Encounter: Attending: Physical Medicine & Rehabilitation | Admitting: Physical Medicine & Rehabilitation

## 2024-11-29 VITALS — BP 112/79 | HR 61 | Ht 66.0 in | Wt 218.0 lb

## 2024-11-29 DIAGNOSIS — S069X1D Unspecified intracranial injury with loss of consciousness of 30 minutes or less, subsequent encounter: Secondary | ICD-10-CM | POA: Insufficient documentation

## 2024-11-29 DIAGNOSIS — G479 Sleep disorder, unspecified: Secondary | ICD-10-CM | POA: Insufficient documentation

## 2024-11-29 MED ORDER — PRAZOSIN HCL 1 MG PO CAPS: 1.0000 mg | ORAL_CAPSULE | Freq: Every day | ORAL | 3 refills | Status: AC

## 2024-11-29 NOTE — Patient Instructions (Signed)
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 We reviewed your current medications and discussed potential adjustments to improve your symptoms.  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 The decrease in your physical activity is probably related to the emotional stress and caregiving responsibilities. You are encouraged to gradually increase your physical activity and ambulation as tolerated.  -SLEEP DISORDER WITH ABNORMAL DREAMS: Your sleep disturbances, including frequent awakenings and abnormal dreams, are likely worsened by emotional stress. We discussed the potential benefit of adding prazosin  at bedtime to help with the abnormal dreams. We also reviewed your current use of melatonin, trazodone , and duloxetine , and how these medications might affect your sleep.  -PERSISTENT TREMOR: Your tremor is generally well-controlled but can worsen with stress or fatigue. No changes to your current management are needed at this time.  -RESTLESS LEGS SYNDROME: You are currently not experiencing symptoms of restless legs syndrome while taking gabapentin  600 mg at bedtime. We discussed reducing your gabapentin  dose to 300 mg at bedtime for two weeks, and then discontinuing it if your symptoms do not return.  -LOOSE STOOL AND URINARY URGENCY AFTER PELVIC SURGERY: You continue to experience loose stools and urinary urgency following your pelvic surgery. We recommend increasing dietary fiber and making dietary modifications. Additionally, you should follow up with your surgical team for further evaluation.  INSTRUCTIONS: Please follow up with your surgical team regarding your bowel and  bladder symptoms. Gradually reduce your gabapentin  dose to 300 mg at bedtime for two weeks, and then discontinue if your restless legs symptoms do not return. Consider adding prazosin  at bedtime for your abnormal dreams. Continue to increase your physical activity as tolerated.  Prazosin  dose is 1mg  at bedtime. If BP drops below 100 or you feel dizzy then hold losartan  in the short term.

## 2024-11-29 NOTE — Progress Notes (Signed)
 Subjective:    Patient ID: Vanessa [name], female    DOB: 10-18-1951, 73 y.o.   MRN: [id number]  HPI  Discussed the use of AI scribe software for clinical note transcription with the patient, who gave verbal consent to proceed.  History of Present Illness Vanessa [name] is a 73 year old female with traumatic brain injury and history of pelvic surgery who presents for follow-up of persistent functional deficits.  Sleep disturbance and abnormal dreams - Sleep disturbance has persisted for approximately six months. - Frequent nocturnal awakenings and abnormal dreams. - Difficulty returning to sleep after awakening to urinate. - Symptoms coincide with increased emotional stress related to her husband's illness. - Continues nightly melatonin 10 mg and trazodone  100 mg, with occasional additional 50 mg doses for breakthrough insomnia. - Duloxetine  taken each morning. - No other medications used for sleep or anxiety at night.  Bowel and bladder dysfunction post-pelvic surgery - Bowel and bladder dysfunction remain unchanged following pelvic surgery, including complete hysterectomy with oophorectomy in February. - Loose stools and worsened bladder control persist. - Dietary modifications have failed to provide relief. - Subsequent surgical intervention for bowel symptoms was unsuccessful.  Tremor - Tremor is overall well controlled. - Symptoms are more noticeable when fatigued or under emotional stress.  Restless legs syndrome - No current symptoms of restless legs. - Continues gabapentin  600 mg at bedtime, previously prescribed for restless legs. - Considering dose reduction as symptoms have resolved.  Functional deficits and physical activity - Physical activity has decreased over the past year due to caregiving responsibilities and emotional stress. - Increased sedentary behavior and mild instability with ambulation. - Recently resumed some outdoor activities, such as yard  work, but overall activity remains limited.     Pain Inventory Average Pain no pain Pain Right Now no pain My pain is no pain  In the last 24 hours, has pain interfered with the following? General activity 0 Relation with others 0 Enjoyment of life 0 What TIME of day is your pain at its worst? No pain Sleep (in general) Negative  Pain is worse with: No pain Pain improves with: no pain Relief from Meds: 0  Family History  Problem Relation Age of Onset   Cancer Mother    Social History   Socioeconomic History   Marital status: Married    Spouse name: Not on file   Number of children: Not on file   Years of education: Not on file   Highest education level: Not on file  Occupational History   Not on file  Tobacco Use   Smoking status: Never   Smokeless tobacco: Never  Vaping Use   Vaping status: Never Used  Substance and Sexual Activity   Alcohol  use: No    Alcohol /week: 0.0 standard drinks of alcohol    Drug use: No   Sexual activity: Yes    Birth control/protection: None  Other Topics Concern   Not on file  Social History Narrative   Not on file   Social Drivers of Health   Tobacco Use: Low Risk (11/29/2024)   Patient History    Smoking Tobacco Use: Never    Smokeless Tobacco Use: Never    Passive Exposure: Not on file  Financial Resource Strain: Low Risk (05/26/2022)   Received from [facility]   Overall Financial Resource Strain (CARDIA)    Difficulty of Paying Living Expenses: Not hard at all  Food Insecurity: Low Risk (06/22/2024)   Received from [facility]  Epic    Within the past 12 months, you worried that your food would run out before you got money to buy more: Never true    Within the past 12 months, the food you bought just didn't last and you didn't have money to get more. : Never true  Transportation Needs: No Transportation Needs (06/22/2024)   Received from [facility]    In the past 12 months, has lack of reliable  transportation kept you from medical appointments, meetings, work or from getting things needed for daily living? : No  Physical Activity: Unknown (05/26/2022)   Received from [facility], [facility] visits prior to 02/13/2023.   Exercise Vital Sign    On average, how many days per week do you engage in moderate to strenuous exercise (like a brisk walk)?: 2 days    Minutes of Exercise per Session: Not on file  Stress: No Stress Concern Present (05/26/2022)   Received from [facility] visits prior to 02/13/2023., [facility]   [employer] [facility] [employer] - Occupational Stress Questionnaire    Feeling of Stress : Only a little  Social Connections: Socially Integrated (05/26/2022)   Received from [facility], [facility] visits prior to 02/13/2023.   Social Connection and Isolation Panel    In a typical week, how many times do you talk on the phone with family, friends, or neighbors?: Three times a week    How often do you get together with friends or relatives?: Once a week    How often do you attend church or religious services?: More than 4 times per year    Do you belong to any clubs or organizations such as church groups, unions, fraternal or athletic groups, or school groups?: Yes    How often do you attend meetings of the clubs or organizations you belong to?: More than 4 times per year    Are you married, widowed, divorced, separated, never married, or living with a partner?: Married  Depression (PHQ2-9): Low Risk (05/26/2023)   Depression (PHQ2-9)    PHQ-2 Score: 0  Alcohol  Screen: Not on file  Housing: Low Risk (06/22/2024)   Received from [facility]   Epic    What is your living situation today?: I have a steady place to live    Think about the place you live. Do you have problems with any of the following? Choose all that apply:: None/None on this list  Utilities: Low Risk (06/22/2024)   Received from  [facility]   Utilities    In the past 12 months has the electric, gas, oil, or water  company threatened to shut off services in your home? : No  Health Literacy: Not on file   Past Surgical History:  Procedure Laterality Date   ANKLE FRACTURE SURGERY Left    ANTERIOR AND POSTERIOR REPAIR WITH SACROSPINOUS FIXATION N/A 01/25/2024   Procedure: ANTERIOR AND POSTERIOR REPAIR WITH SACROSPINOUS FIXATION;  Surgeon: Curlene Agent, MD;  Location: [facility] OR;  Service: Gynecology;  Laterality: N/A;   ELBOW LIGAMENT RECONSTRUCTION Right 08/27/2022   Procedure: ELBOW LIGAMENT RECONSTRUCTION;  Surgeon: Celena Sharper, MD;  Location: [facility] OR;  Service: Orthopedics;  Laterality: Right;   EXTERNAL FIXATION REMOVAL Right 10/13/2022   Procedure: REMOVAL EXTERNAL FIXATION ARM;  Surgeon: Celena Sharper, MD;  Location: [facility] OR;  Service: Orthopedics;  Laterality: Right;   FRACTURE SURGERY  2011   left ankle Fx s/p fixation  KNEE CARTILAGE SURGERY Right    LAPAROSCOPIC VAGINAL HYSTERECTOMY WITH SALPINGO OOPHORECTOMY N/A 01/25/2024   Procedure: LAPAROSCOPIC ASSISTED VAGINAL HYSTERECTOMY WITH SALPINGO OOPHORECTOMY;  Surgeon: Curlene Agent, MD;  Location: [facility] OR;  Service: Gynecology;  Laterality: N/A;   OPEN REDUCTION INTERNAL FIXATION (ORIF) DISTAL RADIAL FRACTURE Left 08/27/2022   Procedure: OPEN REDUCTION INTERNAL FIXATION (ORIF) DISTAL RADIUS FRACTURE;  Surgeon: Celena Sharper, MD;  Location: [facility] OR;  Service: Orthopedics;  Laterality: Left;   ORIF ULNAR FRACTURE Left 08/27/2022   Procedure: OPEN REDUCTION INTERNAL FIXATION (ORIF) ULNAR FRACTURE;  Surgeon: Celena Sharper, MD;  Location: [facility] OR;  Service: Orthopedics;  Laterality: Left;   RADIAL HEAD ARTHROPLASTY Right 08/27/2022   Procedure: RADIAL HEAD ARTHROPLASTY;  Surgeon: Celena Sharper, MD;  Location: [facility] OR;  Service: Orthopedics;  Laterality: Right;   REVERSE SHOULDER ARTHROPLASTY Right 09/10/2022   Procedure: REVERSE SHOULDER ARTHROPLASTY;  Surgeon: Sharl Selinda Dover, MD;  Location: [facility] ORS;  Service: Orthopedics;  Laterality: Right;   TONSILLECTOMY     Past Surgical History:  Procedure Laterality Date   ANKLE FRACTURE SURGERY Left    ANTERIOR AND POSTERIOR REPAIR WITH SACROSPINOUS FIXATION N/A 01/25/2024   Procedure: ANTERIOR AND POSTERIOR REPAIR WITH SACROSPINOUS FIXATION;  Surgeon: Curlene Agent, MD;  Location: [facility] OR;  Service: Gynecology;  Laterality: N/A;   ELBOW LIGAMENT RECONSTRUCTION Right 08/27/2022   Procedure: ELBOW LIGAMENT RECONSTRUCTION;  Surgeon: Celena Sharper, MD;  Location: [facility] OR;  Service: Orthopedics;  Laterality: Right;   EXTERNAL FIXATION REMOVAL Right 10/13/2022   Procedure: REMOVAL EXTERNAL FIXATION ARM;  Surgeon: Celena Sharper, MD;  Location: [facility] OR;  Service: Orthopedics;  Laterality: Right;   FRACTURE SURGERY  2011   left ankle Fx s/p fixation   KNEE CARTILAGE SURGERY Right    LAPAROSCOPIC VAGINAL HYSTERECTOMY WITH SALPINGO OOPHORECTOMY N/A 01/25/2024   Procedure: LAPAROSCOPIC ASSISTED VAGINAL HYSTERECTOMY WITH SALPINGO OOPHORECTOMY;  Surgeon: Curlene Agent, MD;  Location: [facility] OR;  Service: Gynecology;  Laterality: N/A;   OPEN REDUCTION INTERNAL FIXATION (ORIF) DISTAL RADIAL FRACTURE Left 08/27/2022   Procedure: OPEN REDUCTION INTERNAL FIXATION (ORIF) DISTAL RADIUS FRACTURE;  Surgeon: Celena Sharper, MD;  Location: [facility] OR;  Service: Orthopedics;  Laterality: Left;   ORIF ULNAR FRACTURE Left 08/27/2022   Procedure: OPEN REDUCTION INTERNAL FIXATION (ORIF) ULNAR FRACTURE;  Surgeon: Celena Sharper, MD;  Location: [facility] OR;  Service: Orthopedics;  Laterality: Left;   RADIAL HEAD ARTHROPLASTY Right 08/27/2022   Procedure: RADIAL HEAD ARTHROPLASTY;  Surgeon: Celena Sharper, MD;  Location: [facility] OR;  Service: Orthopedics;  Laterality: Right;   REVERSE SHOULDER ARTHROPLASTY Right 09/10/2022   Procedure: REVERSE SHOULDER ARTHROPLASTY;  Surgeon: Sharl Selinda Dover, MD;  Location: [facility] ORS;  Service: Orthopedics;  Laterality: Right;    TONSILLECTOMY     Past Medical History:  Diagnosis Date   Anxiety    Arthritis    Bladder prolapse, female, acquired    Brain bleed (HCC) 08/2022   History of brain bleed after falling down stairs   COVID-19 03/2022   Depression    GERD (gastroesophageal reflux disease)    Hypertension    Hypothyroidism    Myalgia    Occasional tremors    Pre-diabetes    Restless leg syndrome    Thrombocytopenia (HCC) in the past    BP 112/79 (BP Location: Left Arm, Patient Position: Sitting, Cuff Size: Normal)   Pulse 61   Ht 5' 6 (1.676 m)   Wt 218 lb (98.9 kg)   SpO2 95%   BMI 35.19 kg/m  Opioid Risk Score:   Fall Risk Score:  `1  Depression screen PHQ 2/9     05/26/2023    2:08 PM 05/26/2023    2:02 PM 01/20/2023    2:36 PM 10/21/2022    2:09 PM 02/24/2016    8:23 AM 01/13/2016    8:14 AM 08/09/2015    8:17 AM  Depression screen PHQ 2/9  Decreased Interest 0 0 0 1 0 0 0  Down, Depressed, Hopeless 0 0 0 0 0 0 0  PHQ - 2 Score 0 0 0 1 0 0 0  Altered sleeping    1     Tired, decreased energy    1     Change in appetite    1     Feeling bad or failure about yourself     0     Trouble concentrating    0     Moving slowly or fidgety/restless    0     Suicidal thoughts    0     PHQ-9 Score    4         Data saved with a previous flowsheet row definition       Review of Systems  All other systems reviewed and are negative.      Objective:   Physical Exam General: No acute distress HEENT: NCAT, EOMI, oral membranes moist Cards: reg rate  Chest: normal effort Abdomen: Soft, NT, ND Skin: dry, intact Extremities: no edema Psych: pleasant and appropriate  Skin: residual wounds from ex fix pin sites Neuro: Alert and oriented x 3. Normal insight and awareness. Functional Memory. Normal language and speech. Cranial nerve exam unremarkable. motor 5/5 IN ALL 4'S Musculoskeletal: full PROM right shoulder. -~5-10 degrees from full elbow extension. Right deltoid weak 4/5.  .wide based gait                 Assessment:    1. 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             -At baseline cognitively.  2. Pain Management: g             -cymbalta  for back and radicular pain. - continue for now 3. Mood/Behavior/Sleep:              -trazodone  100 mg nightly.  Uses occasional 50mg  if 100mg  is ineffective.  -10mg  melatonin  -addition of prazosin  for vivid dreams. Hold for low bp          4. Right elbow Fx/dislocation: Per ortho: Continue with home exercise program 5. Urinary urgency: discussed fluid rationing to avoid night time voiding 6.  Restless leg syndrome: 300mg  gabapentin  for 2 weeks then stop.  7.  Loose stool. Had TAH BSO. May we wortwhile to consult with surgeon  -fiber in diet 8.  Persistent tremor: Inderal  20mg  tid helpful.  weaning gabapentin  as above.  9. Left distal radius/ulna Fx s/p ORIF 9/14:               -per ortho 10. Right proximal humerus Fx:  s/p right TSA              -has functional movement of right shoulder  Twenty minutes of face to face patient care time were spent during this visit. All questions were encouraged and answered.  Follow up with me in 6 mos .

## 2025-05-30 ENCOUNTER — Encounter: Admitting: Physical Medicine & Rehabilitation
